# Patient Record
Sex: Female | Born: 1965 | Race: Black or African American | Hispanic: No | Marital: Married | Smoking: Never smoker
Health system: Southern US, Community
[De-identification: ages and names within clinical notes are randomized; demographics above are authoritative.]

## PROBLEM LIST (undated history)

## (undated) DIAGNOSIS — R112 Nausea with vomiting, unspecified: Secondary | ICD-10-CM

## (undated) DIAGNOSIS — I10 Essential (primary) hypertension: Secondary | ICD-10-CM

## (undated) DIAGNOSIS — F32A Depression, unspecified: Secondary | ICD-10-CM

## (undated) DIAGNOSIS — T7840XA Allergy, unspecified, initial encounter: Secondary | ICD-10-CM

## (undated) DIAGNOSIS — F419 Anxiety disorder, unspecified: Secondary | ICD-10-CM

## (undated) DIAGNOSIS — N393 Stress incontinence (female) (male): Secondary | ICD-10-CM

## (undated) DIAGNOSIS — O039 Complete or unspecified spontaneous abortion without complication: Secondary | ICD-10-CM

## (undated) HISTORY — DX: Allergy, unspecified, initial encounter: T78.40XA

## (undated) HISTORY — DX: Essential (primary) hypertension: I10

## (undated) HISTORY — PX: TUBAL LIGATION: SHX77

## (undated) HISTORY — PX: DILATION AND CURETTAGE OF UTERUS: SHX78

## (undated) HISTORY — PX: CHOLECYSTECTOMY: SHX55

## (undated) HISTORY — DX: Complete or unspecified spontaneous abortion without complication: O03.9

---

## 1997-09-08 ENCOUNTER — Ambulatory Visit (HOSPITAL_COMMUNITY): Admission: RE | Admit: 1997-09-08 | Discharge: 1997-09-08 | Payer: Self-pay | Admitting: Obstetrics and Gynecology

## 1997-09-10 ENCOUNTER — Ambulatory Visit (HOSPITAL_COMMUNITY): Admission: RE | Admit: 1997-09-10 | Discharge: 1997-09-10 | Payer: Self-pay | Admitting: Obstetrics and Gynecology

## 1998-06-15 ENCOUNTER — Other Ambulatory Visit: Admission: RE | Admit: 1998-06-15 | Discharge: 1998-06-15 | Payer: Self-pay | Admitting: Obstetrics and Gynecology

## 1998-12-28 ENCOUNTER — Other Ambulatory Visit: Admission: RE | Admit: 1998-12-28 | Discharge: 1998-12-28 | Payer: Self-pay | Admitting: Obstetrics and Gynecology

## 1999-01-06 ENCOUNTER — Ambulatory Visit (HOSPITAL_COMMUNITY): Admission: AD | Admit: 1999-01-06 | Discharge: 1999-01-06 | Payer: Self-pay | Admitting: *Deleted

## 1999-01-06 ENCOUNTER — Encounter (INDEPENDENT_AMBULATORY_CARE_PROVIDER_SITE_OTHER): Payer: Self-pay

## 2000-02-17 ENCOUNTER — Other Ambulatory Visit: Admission: RE | Admit: 2000-02-17 | Discharge: 2000-02-17 | Payer: Self-pay | Admitting: Obstetrics and Gynecology

## 2000-07-31 ENCOUNTER — Encounter: Admission: RE | Admit: 2000-07-31 | Discharge: 2000-08-01 | Payer: Self-pay | Admitting: Obstetrics and Gynecology

## 2000-08-16 ENCOUNTER — Ambulatory Visit (HOSPITAL_COMMUNITY): Admission: RE | Admit: 2000-08-16 | Discharge: 2000-08-16 | Payer: Self-pay | Admitting: Obstetrics and Gynecology

## 2000-08-16 ENCOUNTER — Encounter: Payer: Self-pay | Admitting: Obstetrics and Gynecology

## 2000-09-12 ENCOUNTER — Inpatient Hospital Stay (HOSPITAL_COMMUNITY): Admission: AD | Admit: 2000-09-12 | Discharge: 2000-09-15 | Payer: Self-pay | Admitting: Obstetrics and Gynecology

## 2001-12-03 ENCOUNTER — Other Ambulatory Visit: Admission: RE | Admit: 2001-12-03 | Discharge: 2001-12-03 | Payer: Self-pay | Admitting: Obstetrics and Gynecology

## 2002-09-23 ENCOUNTER — Other Ambulatory Visit: Admission: RE | Admit: 2002-09-23 | Discharge: 2002-09-23 | Payer: Self-pay | Admitting: Obstetrics and Gynecology

## 2002-09-24 ENCOUNTER — Ambulatory Visit (HOSPITAL_COMMUNITY): Admission: RE | Admit: 2002-09-24 | Discharge: 2002-09-24 | Payer: Self-pay | Admitting: Obstetrics and Gynecology

## 2002-09-24 ENCOUNTER — Encounter (INDEPENDENT_AMBULATORY_CARE_PROVIDER_SITE_OTHER): Payer: Self-pay

## 2003-06-18 ENCOUNTER — Ambulatory Visit (HOSPITAL_COMMUNITY): Admission: RE | Admit: 2003-06-18 | Discharge: 2003-06-18 | Payer: Self-pay | Admitting: Obstetrics and Gynecology

## 2003-11-14 ENCOUNTER — Inpatient Hospital Stay (HOSPITAL_COMMUNITY): Admission: AD | Admit: 2003-11-14 | Discharge: 2003-11-16 | Payer: Self-pay | Admitting: Obstetrics and Gynecology

## 2004-02-17 ENCOUNTER — Other Ambulatory Visit: Admission: RE | Admit: 2004-02-17 | Discharge: 2004-02-17 | Payer: Self-pay | Admitting: Obstetrics and Gynecology

## 2005-03-02 ENCOUNTER — Other Ambulatory Visit: Admission: RE | Admit: 2005-03-02 | Discharge: 2005-03-02 | Payer: Self-pay | Admitting: Obstetrics and Gynecology

## 2008-08-25 ENCOUNTER — Emergency Department (HOSPITAL_COMMUNITY): Admission: EM | Admit: 2008-08-25 | Discharge: 2008-08-25 | Payer: Self-pay | Admitting: Emergency Medicine

## 2008-10-22 ENCOUNTER — Ambulatory Visit (HOSPITAL_COMMUNITY): Admission: RE | Admit: 2008-10-22 | Discharge: 2008-10-22 | Payer: Self-pay | Admitting: Obstetrics and Gynecology

## 2008-10-22 ENCOUNTER — Encounter (INDEPENDENT_AMBULATORY_CARE_PROVIDER_SITE_OTHER): Payer: Self-pay | Admitting: General Surgery

## 2008-11-19 ENCOUNTER — Ambulatory Visit (HOSPITAL_COMMUNITY): Admission: RE | Admit: 2008-11-19 | Discharge: 2008-11-19 | Payer: Self-pay | Admitting: Obstetrics and Gynecology

## 2009-10-05 ENCOUNTER — Ambulatory Visit: Payer: Self-pay | Admitting: Diagnostic Radiology

## 2009-10-05 ENCOUNTER — Emergency Department (HOSPITAL_BASED_OUTPATIENT_CLINIC_OR_DEPARTMENT_OTHER): Admission: EM | Admit: 2009-10-05 | Discharge: 2009-10-05 | Payer: Self-pay | Admitting: Emergency Medicine

## 2010-02-24 ENCOUNTER — Inpatient Hospital Stay (INDEPENDENT_AMBULATORY_CARE_PROVIDER_SITE_OTHER)
Admission: RE | Admit: 2010-02-24 | Discharge: 2010-02-24 | Disposition: A | Discharge status: Home / Self Care | Payer: Self-pay | Source: Ambulatory Visit | Attending: Family Medicine | Admitting: Family Medicine

## 2010-02-24 ENCOUNTER — Emergency Department (HOSPITAL_COMMUNITY)
Admission: EM | Admit: 2010-02-24 | Discharge: 2010-02-25 | Disposition: A | Discharge status: Home / Self Care | Payer: 59 | Attending: Emergency Medicine | Admitting: Emergency Medicine

## 2010-02-24 DIAGNOSIS — R112 Nausea with vomiting, unspecified: Secondary | ICD-10-CM | POA: Insufficient documentation

## 2010-02-24 DIAGNOSIS — R509 Fever, unspecified: Secondary | ICD-10-CM | POA: Insufficient documentation

## 2010-02-24 DIAGNOSIS — E860 Dehydration: Secondary | ICD-10-CM

## 2010-02-24 DIAGNOSIS — E869 Volume depletion, unspecified: Secondary | ICD-10-CM | POA: Insufficient documentation

## 2010-02-24 DIAGNOSIS — R000 Tachycardia, unspecified: Secondary | ICD-10-CM | POA: Insufficient documentation

## 2010-02-24 LAB — POCT URINALYSIS DIPSTICK
Bilirubin Urine: NEGATIVE
Ketones, ur: 40 mg/dL — AB
Nitrite: NEGATIVE
Protein, ur: NEGATIVE mg/dL
Specific Gravity, Urine: 1.02 (ref 1.005–1.030)
Urine Glucose, Fasting: 500 mg/dL — AB
Urobilinogen, UA: 1 mg/dL (ref 0.0–1.0)
pH: 6.5 (ref 5.0–8.0)

## 2010-02-24 LAB — POCT I-STAT, CHEM 8
BUN: 13 mg/dL (ref 6–23)
BUN: 10 mg/dL (ref 6–23)
Calcium, Ion: 1.11 mmol/L — ABNORMAL LOW (ref 1.12–1.32)
Calcium, Ion: 1.09 mmol/L — ABNORMAL LOW (ref 1.12–1.32)
Chloride: 99 mEq/L (ref 96–112)
Chloride: 99 mEq/L (ref 96–112)
Creatinine, Ser: 1 mg/dL (ref 0.4–1.2)
Creatinine, Ser: 1 mg/dL (ref 0.4–1.2)
Glucose, Bld: 91 mg/dL (ref 70–99)
Glucose, Bld: 89 mg/dL (ref 70–99)
HCT: 47 % — ABNORMAL HIGH (ref 36.0–46.0)
Hemoglobin: 16 g/dL — ABNORMAL HIGH (ref 12.0–15.0)
Potassium: 3.5 mEq/L (ref 3.5–5.1)
Potassium: 3.6 mEq/L (ref 3.5–5.1)
Sodium: 136 mEq/L (ref 135–145)
TCO2: 25 mmol/L (ref 0–100)

## 2010-02-24 LAB — POCT PREGNANCY, URINE: Preg Test, Ur: NEGATIVE

## 2010-03-17 LAB — URINALYSIS, ROUTINE W REFLEX MICROSCOPIC
Glucose, UA: NEGATIVE mg/dL
Hgb urine dipstick: NEGATIVE
Specific Gravity, Urine: 1.025 (ref 1.005–1.030)
Urobilinogen, UA: 1 mg/dL (ref 0.0–1.0)
pH: 7.5 (ref 5.0–8.0)

## 2010-03-17 LAB — COMPREHENSIVE METABOLIC PANEL
Albumin: 4.3 g/dL (ref 3.5–5.2)
Alkaline Phosphatase: 91 U/L (ref 39–117)
BUN: 10 mg/dL (ref 6–23)
GFR calc Af Amer: >60 mL/min (ref >60)
Potassium: 4.2 mEq/L (ref 3.5–5.1)
Total Protein: 7.7 g/dL (ref 6.0–8.3)

## 2010-03-17 LAB — CBC
MCV: 89.4 fL (ref 78.0–100.0)
Platelets: 302 10*3/uL (ref 150–400)
RDW: 12.3 % (ref 11.5–15.5)
WBC: 6.4 10*3/uL (ref 4.0–10.5)

## 2010-03-17 LAB — URINE MICROSCOPIC-ADD ON

## 2010-03-17 LAB — DIFFERENTIAL
Basophils Relative: 0 % (ref 0–1)
Lymphocytes Relative: 9 % — ABNORMAL LOW (ref 12–46)
Monocytes Absolute: 0.1 10*3/uL (ref 0.1–1.0)
Monocytes Relative: 1 % — ABNORMAL LOW (ref 3–12)
Neutro Abs: 5.8 10*3/uL (ref 1.7–7.7)

## 2010-04-07 LAB — PREGNANCY, URINE: Preg Test, Ur: NEGATIVE

## 2010-04-07 LAB — COMPREHENSIVE METABOLIC PANEL
CO2: 25 mEq/L (ref 19–32)
Calcium: 9 mg/dL (ref 8.4–10.5)
Creatinine, Ser: 0.82 mg/dL (ref 0.4–1.2)
GFR calc non Af Amer: >60 mL/min (ref >60)
Glucose, Bld: 90 mg/dL (ref 70–99)

## 2010-04-07 LAB — CBC
Hemoglobin: 14 g/dL (ref 12.0–15.0)
MCHC: 33.4 g/dL (ref 30.0–36.0)
MCV: 89.4 fL (ref 78.0–100.0)
RBC: 4.67 MIL/uL (ref 3.87–5.11)

## 2010-04-07 LAB — DIFFERENTIAL
Eosinophils Absolute: 0.1 10*3/uL (ref 0.0–0.7)
Lymphocytes Relative: 37 % (ref 12–46)
Lymphs Abs: 2.6 10*3/uL (ref 0.7–4.0)
Neutrophils Relative %: 54 % (ref 43–77)

## 2010-04-09 LAB — POCT URINALYSIS DIP (DEVICE)
Glucose, UA: NEGATIVE mg/dL
Specific Gravity, Urine: 1.015 (ref 1.005–1.030)
Urobilinogen, UA: 0.2 mg/dL (ref 0.0–1.0)

## 2010-04-09 LAB — POCT PREGNANCY, URINE: Preg Test, Ur: NEGATIVE

## 2010-04-09 LAB — COMPREHENSIVE METABOLIC PANEL
ALT: 9 U/L (ref 0–35)
AST: 26 U/L (ref 0–37)
Albumin: 3.8 g/dL (ref 3.5–5.2)
Alkaline Phosphatase: 83 U/L (ref 39–117)
BUN: 4 mg/dL — ABNORMAL LOW (ref 6–23)
Chloride: 103 mEq/L (ref 96–112)
Potassium: 3.7 mEq/L (ref 3.5–5.1)
Sodium: 134 mEq/L — ABNORMAL LOW (ref 135–145)
Total Bilirubin: 0.8 mg/dL (ref 0.3–1.2)

## 2010-04-09 LAB — CBC
HCT: 36.2 % (ref 36.0–46.0)
Platelets: 309 10*3/uL (ref 150–400)
WBC: 7 10*3/uL (ref 4.0–10.5)

## 2010-04-09 LAB — DIFFERENTIAL
Basophils Absolute: 0 10*3/uL (ref 0.0–0.1)
Basophils Relative: 1 % (ref 0–1)
Eosinophils Absolute: 0 10*3/uL (ref 0.0–0.7)
Eosinophils Relative: 0 % (ref 0–5)
Monocytes Absolute: 0.1 10*3/uL (ref 0.1–1.0)
Monocytes Relative: 2 % — ABNORMAL LOW (ref 3–12)
Neutro Abs: 6.2 10*3/uL (ref 1.7–7.7)

## 2010-05-20 NOTE — Op Note (Signed)
NAME:  Karen Cameron, Karen Cameron                         ACCOUNT NO.:  [id number]   MEDICAL RECORD NO.:  [id number]                   PATIENT TYPE:  AMB   LOCATION:  SDC                                  FACILITY:  [facility]   PHYSICIAN:  Hal Morales, M.D.             DATE OF BIRTH:  Oct 08, 1965   DATE OF PROCEDURE:  09/24/2002  DATE OF DISCHARGE:                                 OPERATIVE REPORT   PREOPERATIVE DIAGNOSIS:  Missed abortion.   POSTOPERATIVE DIAGNOSIS:  Missed abortion.   OPERATION:  Suction dilatation and evacuation.   ANESTHESIA:  Monitored anesthesia care and local.   ESTIMATED BLOOD LOSS:  Less than 25 mL.   COMPLICATIONS:  None.   FINDINGS:  The uterus was enlarged to approximately eight weeks' size with a  moderate amount of products of conception obtained at D&E.   DESCRIPTION OF PROCEDURE:  A discussion had been held with the patient after  the finding of an eight week IUP with no fetal cardiac activity on  ultrasound.  She was given the option of observation, dilatation and  evacuation, or management with Cytotec.  She opted for suction D&E.  The  risks of anesthesia, bleeding, infection, and damage to adjacent organs were  all understood.  We proceeded to the operating room after appropriate  identification and placed the patient on the operating table.  After  placement of equipment for monitored anesthesia care, she was placed in the  lithotomy position.  The perineum and vagina were prepped with multiple  layers of Betadine and the bladder emptied with a red Robinson catheter.  The perineum was draped as a sterile field.  A Graves speculum was placed in  the vagina and a paracervical block achieved with a total of 10 mL of 2%  Xylocaine in the 5 and 7 o'clock positions.  A single-tooth tenaculum was  placed and the cervix dilated to accommodate a #8 suction curette.  This was  used to suction-evacuate all quadrants of the uterus.  A sharp curette was  used to  ensure that products of conception were removed.  Hemostasis was  noted to be adequate.  The instruments were then removed and the patient  taken from the operating room to the recovery room in satisfactory  condition, having tolerated the procedure well with sponge and instrument  counts correct.  Blood type is O positive.  Discharge medications:  Methergine 0.2 mg p.o. q.6h. for eight doses, doxycycline 100 mg p.o.  b.i.d., for five days, prenatal vitamins, ibuprofen 600 mg over-the-counter  q.6h. p.r.n. pain.  Follow-up:  The patient is to call for a two-week  appointment.  Discharge instructions:  Printed instructions from the [facility] [place].  Hal Morales, M.D.    VPH/MEDQ  D:  09/24/2002  T:  09/24/2002  Job:  [id number]

## 2010-05-20 NOTE — H&P (Signed)
[facility]  Patient:    FRANCIES, INCH Visit Number: [id number] MRN: [id number]          Service Type: OBS Location: 910B [id number] [id number] Attending Physician:  Leonard Schwartz Dictated by:   Mack Guise, C.N.M. Admit Date:  09/12/2000                           History and Physical  HISTORY OF PRESENT ILLNESS:   Ms. Bilton is a 45 year old, gravida 2, para 0-0-1-0, at 39-1/2 weeks. She is admitted with gestational diabetes following questionable deceleration on electronic fetal monitoring in the office, for induction of labor. She reports positive fetal movement, no bleeding, no rupture of membranes. She denies any headache, visual changes, or epigastric pain. Her pregnancy has been followed by the MD service at [facility] and is remarkable for (1) family history of Downs syndrome, (2) group B strep negative. This patient was initially evaluated at the office of [facility] on February 17, 2000 at approximately nine weeks gestation. Her pregnancy has been complicated by gestational diabetes. Her blood sugars have been well maintained with diet. She has been normotensive with no proteinuria, being size less than dates throughout, but ultrasound followup for growth has been appropriate.  PRENATAL LABORATORY WORK:     On February 17, 2000 prenatal lab work found hemoglobin and hematocrit 11.6 and 36.5, platelets 327,000. Blood type and Rh O positive, antibody screen negative, VDRL nonreactive, rubella immune, hepatitis B surface antigen negative, HIV nonreactive. Pap smear was within normal limits. GC and Chlamydia were negative. On March 21, 2000 AFP/free beta hCG within normal range. On July 14, 2000 one-hour glucose challenge was 142. Three-hour GTT values ___, 190, 185, and 170 for which patient was diagnosed with gestational diabetes. Hemoglobin at 28 weeks 10.6, and at 36 weeks culture of the vaginal tract is negative for group B strep.  OBSTETRICAL  HISTORY:          January 2001 first trimester missed AB with D&E and the present pregnancy.  MEDICAL HISTORY:              Unremarkable.  FAMILY HISTORY:               Maternal aunt bypass surgery and maternal aunt hypertension. Patients father is a smoker.  GENETIC HISTORY:              Patients first cousins baby born with Downs syndrome.  ALLERGIES:                    No known drug allergies.  HABITS:                       Patient denies the use of tobacco, alcohol, or illicit drugs.  SOCIAL HISTORY:               Ms. Romanski is a 45 year old African-American married female. Her husband, Kamica Florance, is involved and supportive. They are of the [facility] faith.  REVIEW OF SYSTEMS:            As described above.  PHYSICAL EXAMINATION:  VITAL SIGNS:                  Vital signs stable, afebrile.  HEENT:                        Unremarkable.  NECK:  Thyroid not enlarged.  HEART:                        Regular rate and rhythm.  LUNGS:                        Clear.  ABDOMEN:                      Gravid in its contour, uterine fundus is noted to extend 36 cm above the level of pubic symphysis. Leopolds maneuvers finds the infant to be in longitudinal lie, cephalic presentation, and the estimated fetal weight is 7 pounds.  ELECTRONIC FETAL MONITOR:     Fetal heart rate noted questionable decelerations earlier in the office. The fetal heart rate is reactive at present with mild uterine contractions.  PELVIC:                       Digital exam of the cervix finds it to be 3 cm dilated, 75% effaced, with a cephalic presenting part at a -1 station.  EXTREMITIES:                  DTRs are 1+ with no clonus.  ASSESSMENT:                   1. Intrauterine pregnancy at 39-1/2 weeks.                               2. Questionable deceleration on electronic fetal                                  monitoring.  PLAN:                         1. Admit per Dr. Marline Backbone.                               2. Induction of labor in a.m.                               3. Check blood sugars q.2h. Dictated by:   Mack Guise, C.N.M. Attending Physician:  Leonard Schwartz DD:  09/12/00 TD:  09/12/00 Job: [id number]

## 2010-05-20 NOTE — H&P (Signed)
[facility]  Patient:    Karen Cameron                       MRN: [id number] Adm. Date:  [id number] Attending:  Pleas Koch                         History and Physical  HISTORY:                      Patient is a 45 year old gravida 1, para 0 who presented for a routine new OB visit at approximately 10-weeks estimated gestational age.  Fetal heart tones could not be auscultated.  Patient returned on today for an ultrasound; missed Ab was confirmed.  Patient was therefore consented for dilatation and evacuation.  ALLERGIES:                    Patient has no known drug allergies.  MEDICATIONS:                  She takes no medications chronically.  PAST MEDICAL HISTORY:         She denies medical illnesses.  PAST SURGICAL HISTORY:        Noncontributory.  FAMILY HISTORY:               Significant for aunt with diabetes and hypertension.  SOCIAL HISTORY:               Patient is in a stable marital relationship.  She  denies domestic violence.  She denies tobacco, alcohol or recreational drug use.  REVIEW OF SYSTEMS:            Noncontributory.  PHYSICAL EXAMINATION:  VITAL SIGNS:                  Stable.  Patient is afebrile.  HEENT:                        Within normal limits.  LUNGS:                        Clear to auscultation bilaterally.  HEART:                        Regular rate and rhythm.  ABDOMEN:                      Soft, nontender.  No hepatosplenomegaly.  EXTREMITIES:                  No clubbing, cyanosis, or edema.  PELVIC:                       Normal external female genitalia.  There is a small amount of dark brown blood in the vaginal vault.  Cervix is closed.  Uterus is anteverted, approximately 10- to 12-weeks-size.  Adnexa revealed no masses.  RECTAL:                       Deferred.  LABORATORY DATA:              Blood type is O-positive.  IMPRESSION:                   Missed abortion.  PLAN:  Risks, benefits and alternatives to dilatation and evacuation were reviewed.  Patient is understanding and willing to accept these  risks which include, but are not limited by, a risk of bleeding, risk of infection and risk of damage to the uterus and subsequently internal organs such as the bowel and bladder; patient has thus been consented. DD:  01/06/99 TD:  01/07/99 Job: [id number]

## 2010-05-20 NOTE — H&P (Signed)
NAMEBEVERLY, Karen Cameron               ACCOUNT NO.:  [id number]   MEDICAL RECORD NO.:  [id number]          PATIENT TYPE:  INP   LOCATION:  9164                          FACILITY:  [facility]   PHYSICIAN:  Janine Limbo, M.D.DATE OF BIRTH:  09-10-1965   DATE OF ADMISSION:  11/14/2003  DATE OF DISCHARGE:                                HISTORY & PHYSICAL   Karen Cameron is a 45 year old gravida 4, para 1, 0-2-1 at 59 5/7 weeks who  presents with uterine contractions every 10 minutes for the last 12 hours.  She denies any bleeding or leaking and reports positive fetal movement.  She  denies headache, visual symptoms, or epigastric pain.  Her cervix in the  office yesterday was 2 cm, 50%, vertex, at a -1 station.  Her pregnancy has  been remarkable for:  1) History of gestational diabetes last pregnancy.  2)  Advanced maternal age with amnio decline, but normal quadruple screen.  3)  History of fibroids.  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  5) Two SABs.  6) Family history of Down syndrome.   PRENATAL LABORATORY:  Blood type O positive, Rh antibody negative, VDRL  nonreactive.  Rubella titer positive.  Hepatitis B surface antigen negative.  Sickle cell test negative.  CG and Chlamydia cultures were negative in the  first trimester.  Pap was normal.  Glucose challenge at 18 weeks was  elevated at 143.  Three hour GGT was normal.  Hemoglobin upon entering the  practice was 11.6; it was within normal limits at 28 weeks.  Quadruple  screen was normal.  GC Chlamydia cultures and beta Strep were all negative  at 36 weeks.   EDC of November 16, 2003 was established by last menstrual period and was in  agreement with ultrasound at approximately 18 weeks.  The patient did have a  positive E coli urine culture at her fist visit.  She was treated with  Macrobid.   HISTORY OF PRESENT PREGNANCY:  The patient entered care at approximately 12  weeks.  She has a UTI at that visit  and was treated with Macrobid. She was  found to have E coli and this was sensitive to the Macrobid.  She declined  amniocentesis.  She had a quadruple screen that was normal.  She had an  ultrasound at 18 weeks showing normal growth and development.  Her one hour  Glucola at 19 weeks was elevated at 143.  Her three hour Glucola was normal.  She had another ultrasound at 35 weeks, secondary to size less than date.  She was measuring at the 82nd percentile for 36 weeks with normal fluid.  The rest of her pregnancy was essentially uncomplicated.   OBSTETRIC HISTORY:  In 2001, she had a first trimester missed AB with a D&E.  In 2002, she had a spontaneous vaginal birth of a female infant, weight 6  pounds 2 ounces at 38 6/7 weeks.  She was in labor 10 hours.  She had  epidural anesthesia.  She did have gestational diabetes, diet controlled  with that pregnancy.  In September of 2004, she had a spontaneous missed AB  and did have a D&E.   PAST MEDICAL HISTORY:  She was on oral contraceptives in the past.  She had  a vulvar labial mole removed in the past that was benign.  She had fibroids  noted in a previous pregnancy, then they were not noted in her subsequent  pregnancies.  She reports the usual childhood illnesses.   PAST SURGICAL HISTORY:  Includes the removal of the vulvar mole in December  of 2004, which was benign and two D&Es.   Her only other hospitalization was for childbirth.   She has no known medication allergies.   FAMILY HISTORY:  Her paternal aunt had an MI and hypertension.  Her mother  has adult onset diabetes.   GENETIC HISTORY:  Remarkable for the patient's first cousin having a child  with Down syndrome.   SOCIAL HISTORY:  The patient is married to the father of the baby.  He is  involved and supportive.  His name is Elliot Cousin.  The patient is college  educated.  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  Her husband also is  college educated.  He is employed  with the [employer].  She has been followed  by the physician service of [facility].  She denies any alcohol,  drug, or tobacco use during this pregnancy.   PHYSICAL EXAMINATION:  Blood pressures were initially 120/90, 129/94, and  131/97.  Other vital signs were stable. After observation and reevaluation,  her blood pressure was in the 120s/70s.  HEENT:  Within normal limits  LUNGS:  Breath sounds are clear.  HEART:  Regular rate and rhythm without murmur.  BREASTS:  Soft and nontender.  ABDOMEN:  Fundal height is approximately 36 cm.  Estimated fetal weight is 7  pounds.  Uterine contractions every five minutes, moderate quality.  Cervical exam initially was 2-3, 70%, vertex, at a -1 station with a  probable transverse position in vertex.  After ambulation and reevaluation,  the cervix was 3-4 cm, 70%, vertex, at a -1.  Fetal heart rate is reactive  with no decelerations.  Uterine contractions now are approximately every  five minutes, moderate quality. Urine examination was negative for protein.  EXTREMITIES:  Deep tendon reflexes are 2+ without clonus.  There is trace  edema noted.   IMPRESSION:  1.  Intrauterine pregnancy at 39 5/7 weeks.  2.  Early labor.  3.  Negative group B Strep.   PLAN:  1.  Admit to birthing suite for consult with Dr. Marline Backbone, attending      physician.  2.  Routine physician orders.  3.  IV pain medication or epidural p.r.n.     Vick   VLL/MEDQ  D:  11/14/2003  T:  11/14/2003  Job:  [id number]

## 2010-06-16 ENCOUNTER — Other Ambulatory Visit (HOSPITAL_COMMUNITY): Payer: Self-pay | Admitting: Gastroenterology

## 2010-06-16 DIAGNOSIS — R112 Nausea with vomiting, unspecified: Secondary | ICD-10-CM

## 2010-07-13 ENCOUNTER — Encounter (HOSPITAL_COMMUNITY): Payer: Self-pay

## 2010-07-13 ENCOUNTER — Encounter (HOSPITAL_COMMUNITY)
Admission: RE | Admit: 2010-07-13 | Discharge: 2010-07-13 | Disposition: A | Discharge status: Home / Self Care | Payer: 59 | Source: Ambulatory Visit | Attending: Gastroenterology | Admitting: Gastroenterology

## 2010-07-13 DIAGNOSIS — R112 Nausea with vomiting, unspecified: Secondary | ICD-10-CM | POA: Insufficient documentation

## 2010-07-13 HISTORY — DX: Nausea with vomiting, unspecified: R11.2

## 2010-07-13 MED ORDER — TECHNETIUM TC 99M SULFUR COLLOID
2.1000 | Freq: Once | INTRAVENOUS | Status: AC | PRN
  Administered 2010-07-13: 2.1 via INTRAVENOUS

## 2010-07-27 ENCOUNTER — Other Ambulatory Visit: Payer: Self-pay | Admitting: Gastroenterology

## 2011-12-13 ENCOUNTER — Encounter: Payer: Self-pay | Admitting: Obstetrics and Gynecology

## 2011-12-13 ENCOUNTER — Ambulatory Visit (INDEPENDENT_AMBULATORY_CARE_PROVIDER_SITE_OTHER): Payer: 59 | Admitting: Obstetrics and Gynecology

## 2011-12-13 VITALS — BP 120/78 | HR 100 | Ht 62.0 in | Wt 134.0 lb

## 2011-12-13 DIAGNOSIS — Z124 Encounter for screening for malignant neoplasm of cervix: Secondary | ICD-10-CM

## 2011-12-13 DIAGNOSIS — Z113 Encounter for screening for infections with a predominantly sexual mode of transmission: Secondary | ICD-10-CM

## 2011-12-13 DIAGNOSIS — Z01419 Encounter for gynecological examination (general) (routine) without abnormal findings: Secondary | ICD-10-CM

## 2011-12-13 DIAGNOSIS — Z1239 Encounter for other screening for malignant neoplasm of breast: Secondary | ICD-10-CM

## 2011-12-13 NOTE — Progress Notes (Signed)
Subjective:    Karen Cameron is a 46 y.o. female, G4P0, who presents for an annual exam. The patient reports no complaints. Has not had a mammogram in years.  Menstrual cycle:   LMP: Patient's last menstrual period was 11/17/2011.             Review of Systems Pertinent items are noted in HPI. Denies pelvic pain, urinary tract symptoms, vaginitis symptoms, irregular bleeding, menopausal symptoms, change in bowel habits or rectal bleeding   Objective:    BP 120/78  Pulse 100  Ht 5\' 2"  (1.575 m)  Wt 134 lb (60.782 kg)  BMI 24.51 kg/m2  LMP 11/17/2011    Wt Readings from Last 1 Encounters:  12/13/11 134 lb (60.782 kg)   Body mass index is 24.51 kg/(m^2). General Appearance: Alert, no acute distress HEENT: Grossly normal Neck / Thyroid: Supple, no thyromegaly or cervical adenopathy Lungs: Clear to auscultation bilaterally Back: No CVA tenderness Breast Exam: No masses or nodes.No dimpling, nipple retraction or discharge. Cardiovascular: Regular rate and rhythm.  Gastrointestinal: Soft, non-tender, no masses or organomegaly Pelvic Exam: EGBUS-wnl, vagina-normal rugae, cervix- without lesions or tenderness, uterus appears normal size shape and consistency, adnexae-no masses or tenderness Rectovaginal: no masses and normal sphincter tone Lymphatic Exam: Non-palpable nodes in neck, clavicular,  axillary, or inguinal regions  Skin: no rashes or abnormalities Extremities: no clubbing cyanosis or edema  Neurologic: grossly normal Psychiatric: Alert and oriented    Assessment:   Routine GYN Exam   Plan:  STD testing  PAP sent  RTO 1 year or prn  Ludmila Ebarb,ELMIRAPA-C

## 2011-12-13 NOTE — Progress Notes (Signed)
Regular Periods: yes Mammogram: yes  Monthly Breast Ex.: yes Exercise: NO  Tetanus < 10 years: yes Seatbelts: yes  NI. Bladder Functn.: yes Abuse at home: no  Daily BM's: yes Stressful Work: no  Healthy Diet: yes Sigmoid-Colonoscopy: NO  Calcium: no Medical problems this year: NONE   LAST PAP:6/12  Contraception: BTL  Mammogram:  LONG TIME  PCP: DR. Eldred Manges  PMH: NOCHANGE  FMH: NO CHANGE  Last Bone Scan: NO  PT IS MARRIED

## 2011-12-14 LAB — HSV 2 ANTIBODY, IGG: HSV 2 Glycoprotein G Ab, IgG: 12.62 IV — ABNORMAL HIGH

## 2011-12-14 LAB — PAP IG, CT-NG, RFX HPV ASCU: GC Probe Amp: NEGATIVE

## 2011-12-14 LAB — HEPATITIS B SURFACE ANTIGEN: Hepatitis B Surface Ag: NEGATIVE

## 2011-12-14 LAB — HEPATITIS C ANTIBODY: HCV Ab: NEGATIVE

## 2011-12-15 ENCOUNTER — Ambulatory Visit (HOSPITAL_COMMUNITY)
Admission: RE | Admit: 2011-12-15 | Discharge: 2011-12-15 | Disposition: A | Discharge status: Home / Self Care | Payer: 59 | Source: Ambulatory Visit | Attending: Obstetrics and Gynecology | Admitting: Obstetrics and Gynecology

## 2011-12-15 ENCOUNTER — Telehealth: Payer: Self-pay

## 2011-12-15 DIAGNOSIS — Z1239 Encounter for other screening for malignant neoplasm of breast: Secondary | ICD-10-CM

## 2011-12-15 DIAGNOSIS — Z1231 Encounter for screening mammogram for malignant neoplasm of breast: Secondary | ICD-10-CM | POA: Insufficient documentation

## 2011-12-15 NOTE — Telephone Encounter (Signed)
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  If not refer her to the [email or web address] and      [email or web address] web sites or she can come in for a consultation.  Thanks,  EP

## 2011-12-15 NOTE — Telephone Encounter (Signed)
TC TO PT REGARDING TEST RESULTS. 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 INFORMED PT ABOUT WEBSITES THAT SHE CAN GO TO FOR MORE INFO. PT VOICED UNDERSTANDING.

## 2011-12-29 ENCOUNTER — Other Ambulatory Visit: Payer: Self-pay | Admitting: Obstetrics and Gynecology

## 2011-12-29 MED ORDER — TINIDAZOLE 500 MG PO TABS: ORAL_TABLET | ORAL | Status: DC

## 2013-10-08 ENCOUNTER — Other Ambulatory Visit (HOSPITAL_COMMUNITY): Payer: Self-pay | Admitting: Obstetrics and Gynecology

## 2013-10-08 DIAGNOSIS — Z1231 Encounter for screening mammogram for malignant neoplasm of breast: Secondary | ICD-10-CM

## 2013-10-13 ENCOUNTER — Ambulatory Visit (HOSPITAL_COMMUNITY)
Admission: RE | Admit: 2013-10-13 | Discharge: 2013-10-13 | Disposition: A | Discharge status: Home / Self Care | Payer: 59 | Source: Ambulatory Visit | Attending: Obstetrics and Gynecology | Admitting: Obstetrics and Gynecology

## 2013-10-13 DIAGNOSIS — Z1231 Encounter for screening mammogram for malignant neoplasm of breast: Secondary | ICD-10-CM | POA: Insufficient documentation

## 2013-11-03 ENCOUNTER — Encounter: Payer: Self-pay | Admitting: Obstetrics and Gynecology

## 2013-12-12 ENCOUNTER — Encounter: Payer: Self-pay | Admitting: Podiatrist

## 2013-12-12 ENCOUNTER — Ambulatory Visit (INDEPENDENT_AMBULATORY_CARE_PROVIDER_SITE_OTHER): Payer: 59 | Admitting: Podiatrist

## 2013-12-12 VITALS — BP 142/85 | HR 86 | Resp 16

## 2013-12-12 DIAGNOSIS — B351 Tinea unguium: Secondary | ICD-10-CM

## 2013-12-12 MED ORDER — EFINACONAZOLE 10 % EX SOLN: 1.0000 [drp] | Freq: Every day | CUTANEOUS | Status: DC

## 2013-12-12 NOTE — Progress Notes (Signed)
   Subjective:    Patient ID: Karen Cameron, female    DOB: 1965-05-19, 48 y.o.   MRN: [id number]  HPI Comments: "I have bad looking nails"  Patient c/o thick, dark nails 1st and 5th bilateral for several years. She has tried multiple OTC treatments-no help.      Review of Systems  All other systems reviewed and are negative.      Objective:   Physical Exam Patient is awake, alert, and oriented x 3.  In no acute distress.  Vascular status is intact with palpable pedal pulses at 2/4 DP and PT bilateral and capillary refill time within normal limits. Neurological sensation is also intact bilaterally via Semmes Weinstein monofilament at 5/5 sites. Light touch, vibratory sensation, Achilles tendon reflex is intact. Dermatological exam reveals skin color, turger and texture as normal. No open lesions present.  Musculature intact with dorsiflexion, plantarflexion, inversion, eversion.  Digital nails 1 and 5 bilateral are thickened, discolored, dystrophic, symptomatic, and due to appear clinically mycotic. Over-the-counter topicals have not been beneficial for the treatment of the toenails.     Assessment & Plan:  Onychomycosis 1, 5 bilateral Plan: A sample of the toenail was taken and sent for pathology. Recommended starting on topical antifungal until the results of pathology back. We'll then consider our options at that time.

## 2013-12-12 NOTE — Patient Instructions (Signed)

## 2014-01-07 ENCOUNTER — Telehealth: Payer: Self-pay | Admitting: *Deleted

## 2014-01-07 NOTE — Telephone Encounter (Signed)
I called and left her a message to call me back for culture results.  I need to inform her that the culture results came back positive for fungus per Dr. Irving ShowsEgerton.  Lamisil is best for treating the fungus if the topical is not clearing the nails.  Suggest you try topical for 2-3 months and if no better will prescribe Lamisil.

## 2014-01-08 NOTE — Telephone Encounter (Signed)
"  I'm returning your call about my culture results."  Dr. Irving ShowsEgerton said it came back positive for fungus.  She wants you to use the topical for 2-3 months.  If you don't notice any improvement, she said she can give you a prescription for Lamisil.  "So far I haven't noticed a difference from using the topical."  Okay, just let us know after 2 months then.  "I will, thanks!"

## 2014-01-09 ENCOUNTER — Encounter: Payer: Self-pay | Admitting: Podiatrist

## 2015-01-10 IMAGING — MG MM DIGITAL SCREENING BILAT
4 series · 4 of 4 positions shown · non-contrast
Comparison: Previous exam(s).

CLINICAL DATA: Screening.

EXAM:
DIGITAL SCREENING BILATERAL MAMMOGRAM WITH CAD

[L CC]
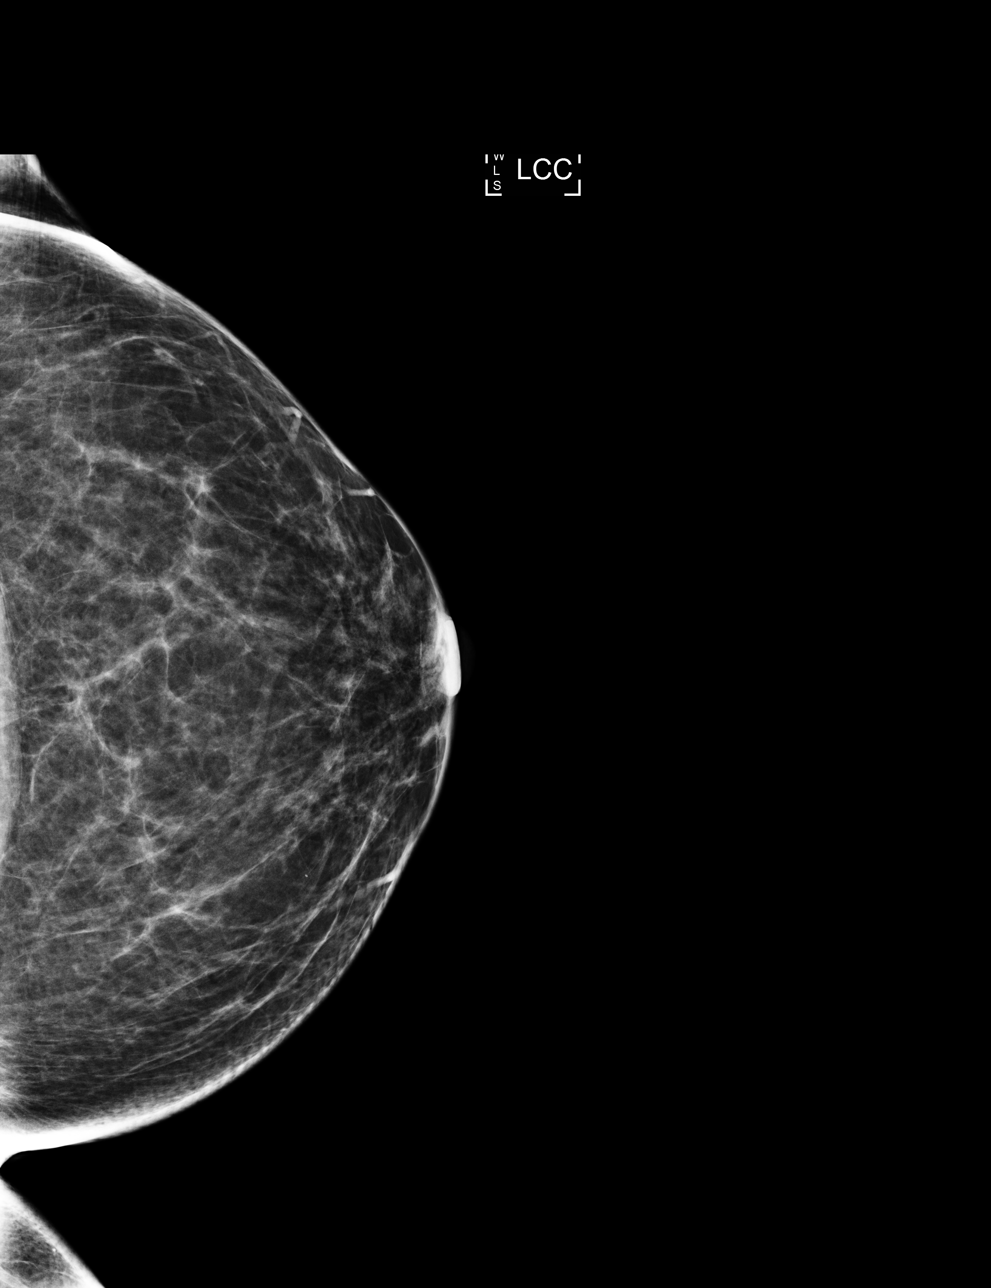

[L MLO]
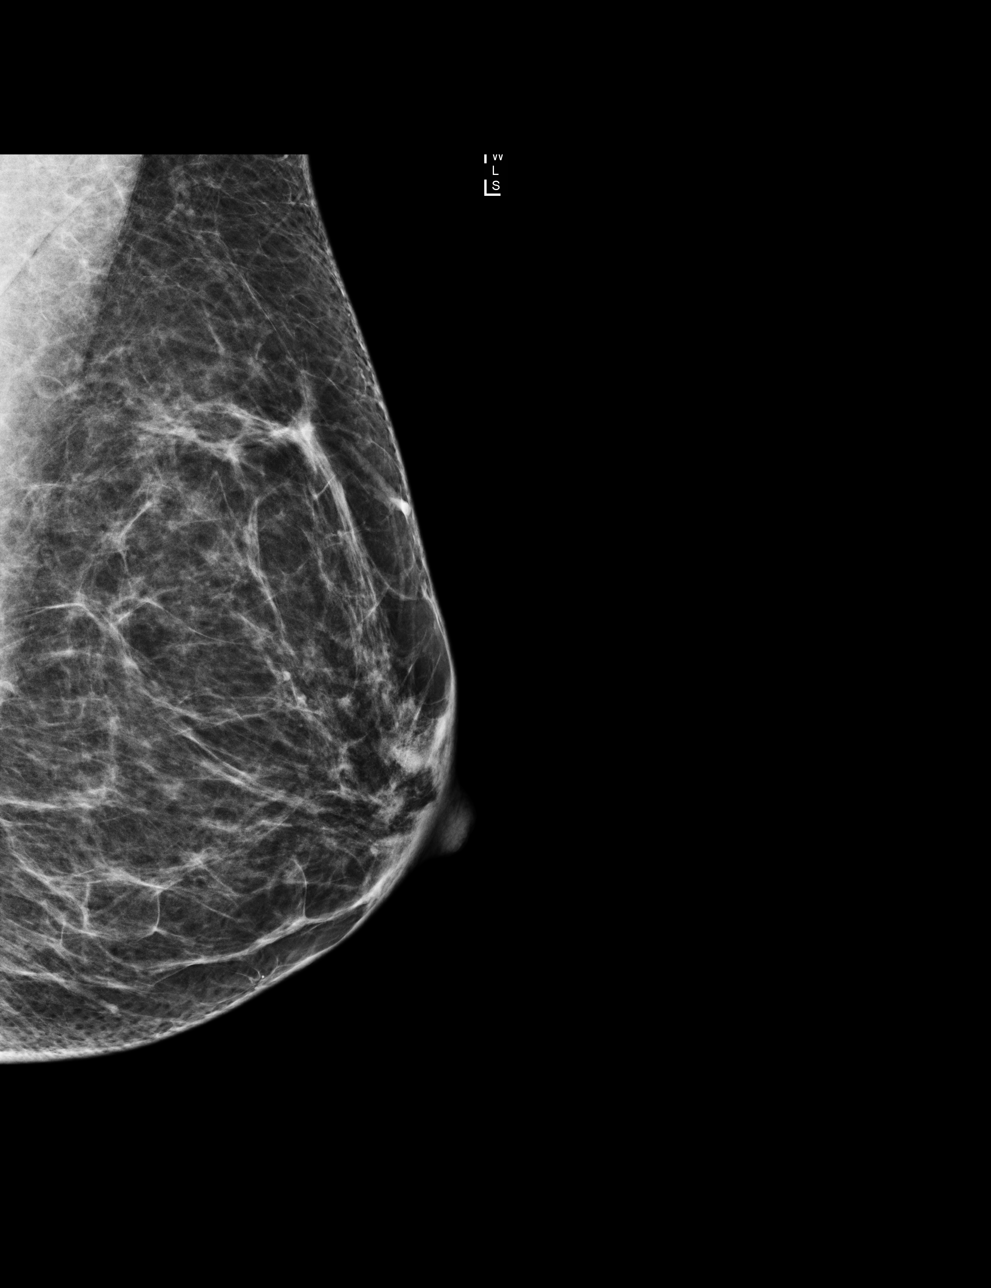

[R MLO]
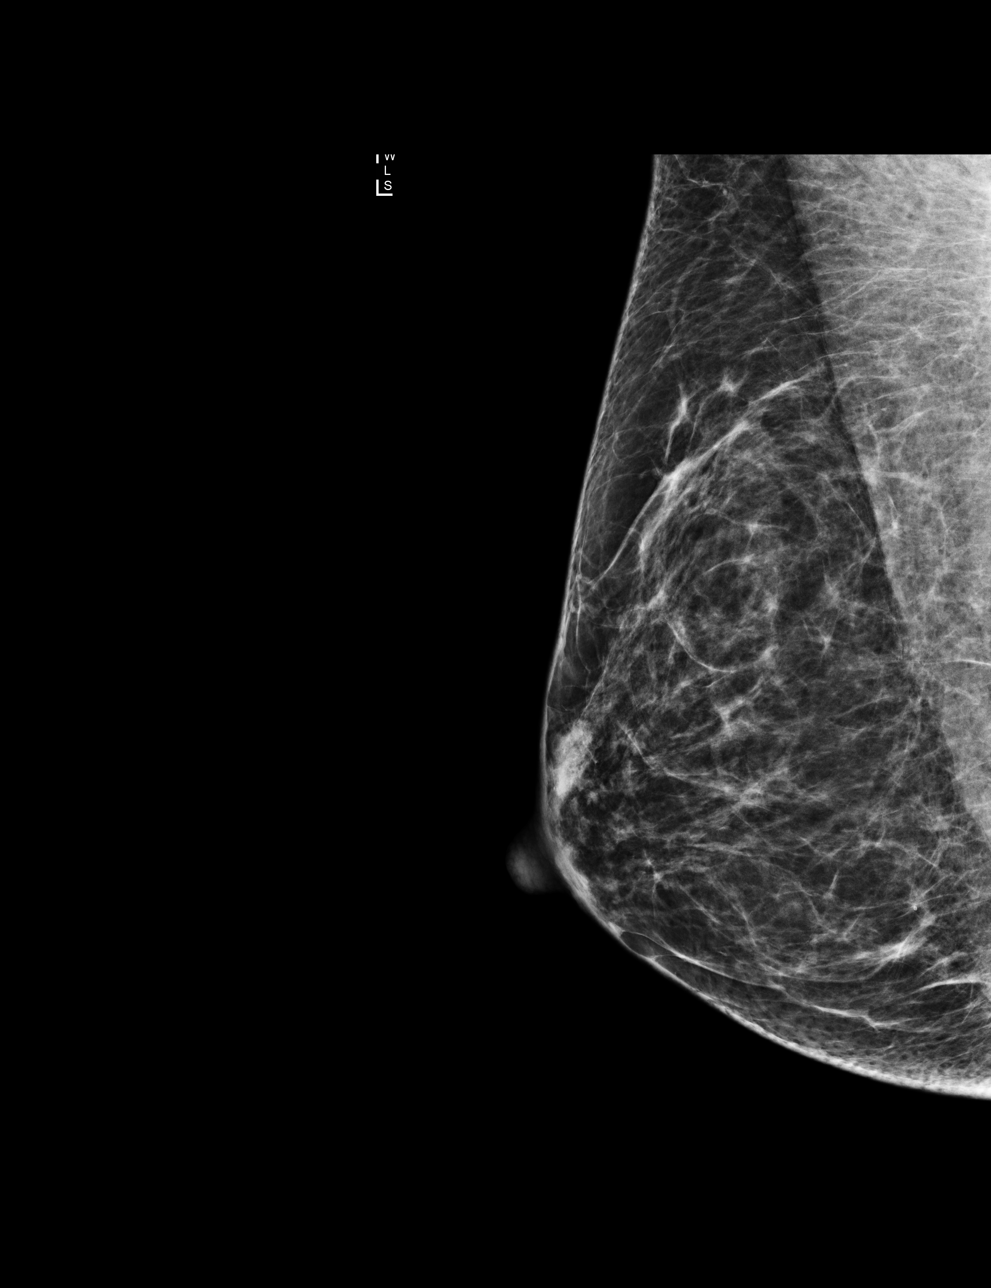

[R CC]
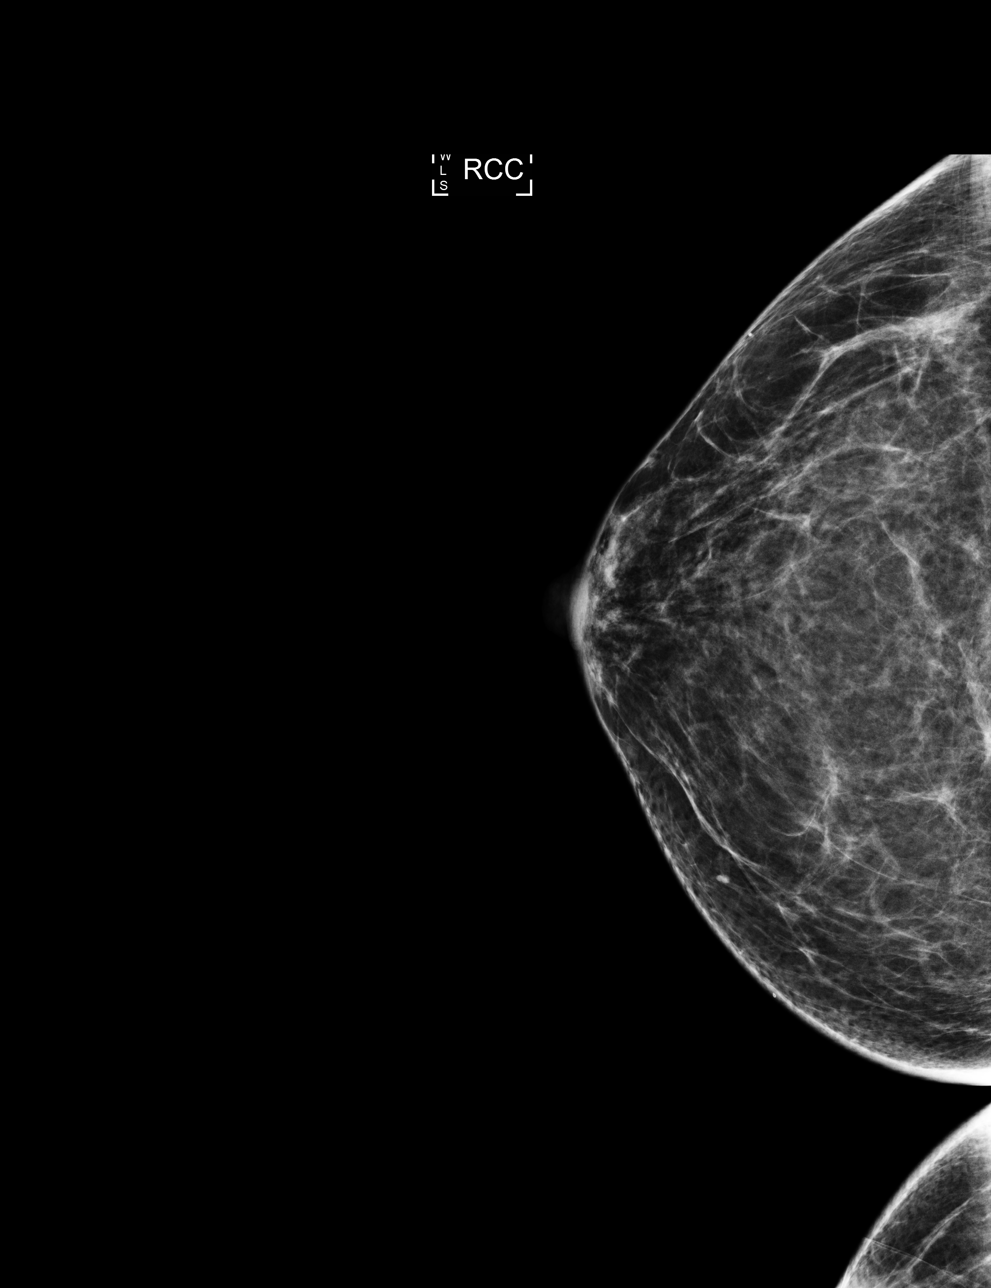

[4 of 4 positions shown; findings below may reference images not displayed]

ACR Breast Density Category b: There are scattered areas of
fibroglandular density.
FINDINGS: There are no findings suspicious for malignancy. Images were
processed with CAD.
IMPRESSION: No mammographic evidence of malignancy. A result letter of this
screening mammogram will be mailed directly to the patient.

RECOMMENDATION:
Screening mammogram in one year. (Code:AS-G-LCT)

BI-RADS CATEGORY  1: Negative.

## 2015-06-16 DIAGNOSIS — R590 Localized enlarged lymph nodes: Secondary | ICD-10-CM | POA: Diagnosis not present

## 2015-06-16 DIAGNOSIS — J310 Chronic rhinitis: Secondary | ICD-10-CM | POA: Insufficient documentation

## 2015-08-05 DIAGNOSIS — K219 Gastro-esophageal reflux disease without esophagitis: Secondary | ICD-10-CM | POA: Diagnosis not present

## 2015-08-05 DIAGNOSIS — R591 Generalized enlarged lymph nodes: Secondary | ICD-10-CM | POA: Diagnosis not present

## 2015-08-05 DIAGNOSIS — R05 Cough: Secondary | ICD-10-CM | POA: Diagnosis not present

## 2015-08-05 MED FILL — PANTOPRAZOLE SOD DR 40 MG T: 40 | 30 days supply | Qty: 60 | Fill #0

## 2015-10-11 DIAGNOSIS — N390 Urinary tract infection, site not specified: Secondary | ICD-10-CM | POA: Diagnosis not present

## 2015-10-11 DIAGNOSIS — R300 Dysuria: Secondary | ICD-10-CM | POA: Diagnosis not present

## 2015-10-12 MED FILL — CIPROFLOXACIN HCL 500 MG TA: 500 | 10 days supply | Qty: 20 | Fill #0

## 2015-10-12 MED FILL — PANTOPRAZOLE SOD DR 40 MG T: 40 | 30 days supply | Qty: 60 | Fill #1

## 2015-10-13 DIAGNOSIS — R591 Generalized enlarged lymph nodes: Secondary | ICD-10-CM | POA: Diagnosis not present

## 2015-11-15 DIAGNOSIS — Z6827 Body mass index (BMI) 27.0-27.9, adult: Secondary | ICD-10-CM | POA: Diagnosis not present

## 2015-11-15 DIAGNOSIS — Z124 Encounter for screening for malignant neoplasm of cervix: Secondary | ICD-10-CM | POA: Diagnosis not present

## 2015-11-15 DIAGNOSIS — Z01419 Encounter for gynecological examination (general) (routine) without abnormal findings: Secondary | ICD-10-CM | POA: Diagnosis not present

## 2015-11-15 DIAGNOSIS — N72 Inflammatory disease of cervix uteri: Secondary | ICD-10-CM | POA: Diagnosis not present

## 2015-11-15 DIAGNOSIS — Z1231 Encounter for screening mammogram for malignant neoplasm of breast: Secondary | ICD-10-CM | POA: Diagnosis not present

## 2015-11-15 MED FILL — VALACYCLOVIR HCL 500 MG TAB: 500 | 45 days supply | Qty: 90 | Fill #0

## 2016-02-02 MED FILL — PANTOPRAZOLE SOD DR 40 MG T: 40 | 30 days supply | Qty: 60 | Fill #2

## 2016-03-22 DIAGNOSIS — H5213 Myopia, bilateral: Secondary | ICD-10-CM | POA: Diagnosis not present

## 2016-04-27 MED FILL — AMOXICILLIN 500 MG CAPSULE: 500 | 7 days supply | Qty: 21 | Fill #0

## 2016-04-27 MED FILL — HYDROCODON-APAP 5-325: 5-325 | 5 days supply | Qty: 20 | Fill #0

## 2016-10-23 ENCOUNTER — Ambulatory Visit (INDEPENDENT_AMBULATORY_CARE_PROVIDER_SITE_OTHER): Payer: 59 | Admitting: Family Medicine

## 2016-10-23 ENCOUNTER — Encounter: Payer: Self-pay | Admitting: Family Medicine

## 2016-10-23 VITALS — BP 138/88 | HR 88 | Temp 98.8°F | Resp 18 | Ht 62.0 in | Wt 137.0 lb

## 2016-10-23 DIAGNOSIS — R05 Cough: Secondary | ICD-10-CM

## 2016-10-23 DIAGNOSIS — R030 Elevated blood-pressure reading, without diagnosis of hypertension: Secondary | ICD-10-CM

## 2016-10-23 DIAGNOSIS — J301 Allergic rhinitis due to pollen: Secondary | ICD-10-CM

## 2016-10-23 DIAGNOSIS — R059 Cough, unspecified: Secondary | ICD-10-CM

## 2016-10-23 NOTE — Patient Instructions (Addendum)
 IF you received an x-ray today, you will receive an invoice from [facility]. Please contact [facility] at [phone] with questions or concerns regarding your invoice.   IF you received labwork today, you will receive an invoice from [facility]. Please contact [facility] at [phone] with questions or concerns regarding your invoice.   Our billing staff will not be able to assist you with questions regarding bills from these companies.  You will be contacted with the lab results as soon as they are available. The fastest way to get your results is to activate your My Chart account. Instructions are located on the last page of this paperwork. If you have not heard from us regarding the results in 2 weeks, please contact this office.      Preventing Hypertension Hypertension, commonly called high blood pressure, is when the force of blood pumping through the arteries is too strong. Arteries are blood vessels that carry blood from the heart throughout the body. Over time, hypertension can damage the arteries and decrease blood flow to important parts of the body, including the brain, heart, and kidneys. Often, hypertension does not cause symptoms until blood pressure is very high. For this reason, it is important to have your blood pressure checked on a regular basis. Hypertension can often be prevented with diet and lifestyle changes. If you already have hypertension, you can control it with diet and lifestyle changes, as well as medicine. What nutrition changes can be made? Maintain a healthy diet. This includes:  Eating less salt (sodium). Ask your health care provider how much sodium is safe for you to have. The general recommendation is to consume less than 1 tsp (2,300 mg) of sodium a day. ? Do not add salt to your food. ? Choose low-sodium options when grocery shopping and eating out.  Limiting fats in your diet. You can do this by eating low-fat or fat-free dairy  products and by eating less red meat.  Eating more fruits, vegetables, and whole grains. Make a goal to eat: ? 1-2 cups of fresh fruits and vegetables each day. ? 3-4 servings of whole grains each day.  Avoiding foods and beverages that have added sugars.  Eating fish that contain healthy fats (omega-3 fatty acids), such as mackerel or salmon.  If you need help putting together a healthy eating plan, try the DASH diet. This diet is high in fruits, vegetables, and whole grains. It is low in sodium, red meat, and added sugars. DASH stands for Dietary Approaches to Stop Hypertension. What lifestyle changes can be made?  Lose weight if you are overweight. Losing just 3?5% of your body weight can help prevent or control hypertension. ? For example, if your present weight is 200 lb (91 kg), a loss of 3-5% of your weight means losing 6-10 lb (2.7-4.5 kg). ? Ask your health care provider to help you with a diet and exercise plan to safely lose weight.  Get enough exercise. Do at least 150 minutes of moderate-intensity exercise each week. ? You could do this in short exercise sessions several times a day, or you could do longer exercise sessions a few times a week. For example, you could take a brisk 10-minute walk or bike ride, 3 times a day, for 5 days a week.  Find ways to reduce stress, such as exercising, meditating, listening to music, or taking a yoga class. If you need help reducing stress, ask your health care provider.  Do not smoke. This includes e-cigarettes.   Chemicals in tobacco and nicotine products raise your blood pressure each time you smoke. If you need help quitting, ask your health care provider.  Avoid alcohol. If you drink alcohol, limit alcohol intake to no more than 1 drink a day for nonpregnant women and 2 drinks a day for men. One drink equals 12 oz of beer, 5 oz of wine, or 1 oz of hard liquor. Why are these changes important? Diet and lifestyle changes can help you  prevent hypertension, and they may make you feel better overall and improve your quality of life. If you have hypertension, making these changes will help you control it and help prevent major complications, such as:  Hardening and narrowing of arteries that supply blood to: ? Your heart. This can cause a heart attack. ? Your brain. This can cause a stroke. ? Your kidneys. This can cause kidney failure.  Stress on your heart muscle, which can cause heart failure.  What can I do to lower my risk?  Work with your health care provider to make a hypertension prevention plan that works for you. Follow your plan and keep all follow-up visits as told by your health care provider.  Learn how to check your blood pressure at home. Make sure that you know your personal target blood pressure, as told by your health care provider. How is this treated? In addition to diet and lifestyle changes, your health care provider may recommend medicines to help lower your blood pressure. You may need to try a few different medicines to find what works best for you. You also may need to take more than one medicine. Take over-the-counter and prescription medicines only as told by your health care provider. Where to find support: Your health care provider can help you prevent hypertension and help you keep your blood pressure at a healthy level. Your local hospital or your community may also provide support services and prevention programs. The American Heart Association offers an online support network at: http://supportnetwork.heart.org/high-blood-pressure Where to find more information: Learn more about hypertension from:  National Heart, Lung, and Blood Institute: www.nhlbi.nih.gov/health/health-topics/topics/hbp  Centers for Disease Control and Prevention: www.cdc.gov/bloodpressure  American Academy of Family Physicians:  http://familydoctor.org/familydoctor/en/diseases-conditions/high-blood-pressure.printerview.all.html  Learn more about the DASH diet from:  National Heart, Lung, and Blood Institute: www.nhlbi.nih.gov/health/health-topics/topics/dash  Contact a health care provider if:  You think you are having a reaction to medicines you have taken.  You have recurrent headaches or feel dizzy.  You have swelling in your ankles.  You have trouble with your vision. Summary  Hypertension often does not cause any symptoms until blood pressure is very high. It is important to get your blood pressure checked regularly.  Diet and lifestyle changes are the most important steps in preventing hypertension.  By keeping your blood pressure in a healthy range, you can prevent complications like heart attack, heart failure, stroke, and kidney failure.  Work with your health care provider to make a hypertension prevention plan that works for you. This information is not intended to replace advice given to you by your health care provider. Make sure you discuss any questions you have with your health care provider. Document Released: 01/03/2015 Document Revised: 08/30/2015 Document Reviewed: 08/30/2015 Elsevier Interactive Patient Education  2017 Elsevier Inc.  

## 2016-10-23 NOTE — Progress Notes (Signed)
Chief Complaint  Patient presents with  . New Patient (Initial Visit)    establish care, elevated bp's at dental visits, cough for yrs-pcp did cxr-clear, saw gi thought it was gerd-given protonix but didn't help, saw ENT thought it was post nasal drip/sinus given zyrtec still taking, coughing episodes that last for hours but then go away    HPI   Elevated bp Pt reports that she has a history of elevated bp at the dental appointments She reports that she gets it checked with the automatic machine  She goes to OB and her bp is normal At the dentist 140/100 around 09/23/2016 She denies headaches, chest pains, palpitations She denies family history of CAD or stroke  Family History  Problem Relation Age of Onset  . Diabetes Mother   . Cancer Father        HEAD,NECK, THROAT  . Diabetes Maternal Aunt   . Diabetes Maternal Uncle   . Diabetes Paternal Grandmother     Cough She reports that she gets cough that seems like it is right in her throat Her cxrs were clear She was treated for reflux and ultimately ENT started zyrtec for postnasal drip She states that her cough is better and seems less noticable Her typical cough is very raspy but non productive and is so forceful that she gets leakage of urine  Allergic rhinitis Pt on zyrtec for postnasal drip She has a rare cough No itching of ears and throat   Past Medical History:  Diagnosis Date  . Nausea & vomiting   . SAB (spontaneous abortion)    X2    Current Outpatient Prescriptions  Medication Sig Dispense Refill  . cetirizine (ZYRTEC) 10 MG tablet Take 10 mg by mouth daily.     No current facility-administered medications for this visit.     Allergies: No Known Allergies  Past Surgical History:  Procedure Laterality Date  . CHOLECYSTECTOMY      Social History   Social History  . Marital status: Married    Spouse name: N/A  . Number of children: N/A  . Years of education: N/A   Social History Main Topics    . Smoking status: Never Smoker  . Smokeless tobacco: Never Used  . Alcohol use Yes  . Drug use: No  . Sexual activity: Yes    Birth control/ protection: Surgical     Comment: BTL   Other Topics Concern  . None   Social History Narrative  . None    Family History  Problem Relation Age of Onset  . Diabetes Mother   . Cancer Father        HEAD,NECK, THROAT  . Diabetes Maternal Aunt   . Diabetes Maternal Uncle   . Diabetes Paternal Grandmother      ROS Review of Systems See HPI Constitution: No fevers or chills No malaise No diaphoresis Skin: No rash or itching Eyes: no blurry vision, no double vision GU: no dysuria or hematuria Neuro: no dizziness or headaches all others reviewed and negative   Objective: Vitals:   10/23/16 1209 10/23/16 1304  BP: 138/88   Pulse: (!) 120 88  Resp: 18   Temp: 98.8 F (37.1 C)   TempSrc: Oral   SpO2: 98%   Weight: 137 lb (62.1 kg)   Height: 5\' 2"  (1.575 m)   Body mass index is 25.06 kg/m.   Physical Exam  Constitutional: She is oriented to person, place, and time. She appears well-developed and well-nourished.  HENT:  Head: Normocephalic and atraumatic.  Eyes: Conjunctivae and EOM are normal.  Neck: Normal range of motion. No thyromegaly present.  Cardiovascular: Normal rate, regular rhythm and normal heart sounds.   Pulmonary/Chest: Effort normal and breath sounds normal. No respiratory distress. She has no wheezes.  Neurological: She is alert and oriented to person, place, and time.  Psychiatric: She has a normal mood and affect. Her behavior is normal. Judgment and thought content normal.       Assessment and Plan Karen Cameron was seen today for new patient (initial visit).  Diagnoses and all orders for this visit:  Prehypertension- will observe and if elevated over 140/90 will start HCTZ  Cough- improved with zyrtec  Seasonal allergic rhinitis due to pollen- continue zyrtec      Jarmaine Ehrler A Creta Levin

## 2017-11-09 ENCOUNTER — Telehealth: Payer: 59 | Admitting: Family

## 2017-11-09 DIAGNOSIS — N390 Urinary tract infection, site not specified: Secondary | ICD-10-CM | POA: Diagnosis not present

## 2017-11-09 MED ORDER — CEPHALEXIN 500 MG PO CAPS: 500.0000 mg | ORAL_CAPSULE | Freq: Two times a day (BID) | ORAL | 0 refills | Status: DC

## 2017-11-09 MED FILL — CEPHALEXIN 500 MG CAPSULE: 500 | 7 days supply | Qty: 14 | Fill #0

## 2017-11-09 NOTE — Progress Notes (Signed)

## 2018-01-07 DIAGNOSIS — Z01419 Encounter for gynecological examination (general) (routine) without abnormal findings: Secondary | ICD-10-CM | POA: Diagnosis not present

## 2018-01-07 DIAGNOSIS — Z1231 Encounter for screening mammogram for malignant neoplasm of breast: Secondary | ICD-10-CM | POA: Diagnosis not present

## 2018-01-07 DIAGNOSIS — Z124 Encounter for screening for malignant neoplasm of cervix: Secondary | ICD-10-CM | POA: Diagnosis not present

## 2018-01-07 DIAGNOSIS — R87618 Other abnormal cytological findings on specimens from cervix uteri: Secondary | ICD-10-CM | POA: Diagnosis not present

## 2018-01-07 DIAGNOSIS — Z6824 Body mass index (BMI) 24.0-24.9, adult: Secondary | ICD-10-CM | POA: Diagnosis not present

## 2018-04-16 ENCOUNTER — Other Ambulatory Visit: Payer: Self-pay

## 2018-04-16 DIAGNOSIS — Z130 Encounter for screening for diseases of the blood and blood-forming organs and certain disorders involving the immune mechanism: Secondary | ICD-10-CM

## 2018-04-16 DIAGNOSIS — Z1329 Encounter for screening for other suspected endocrine disorder: Secondary | ICD-10-CM

## 2018-04-16 DIAGNOSIS — Z13220 Encounter for screening for lipoid disorders: Secondary | ICD-10-CM

## 2018-04-16 DIAGNOSIS — Z13228 Encounter for screening for other metabolic disorders: Secondary | ICD-10-CM

## 2018-04-16 DIAGNOSIS — Z1389 Encounter for screening for other disorder: Secondary | ICD-10-CM

## 2018-04-22 ENCOUNTER — Other Ambulatory Visit: Payer: Self-pay

## 2018-04-22 ENCOUNTER — Telehealth (INDEPENDENT_AMBULATORY_CARE_PROVIDER_SITE_OTHER): Payer: 59 | Admitting: Family Medicine

## 2018-04-22 ENCOUNTER — Ambulatory Visit (INDEPENDENT_AMBULATORY_CARE_PROVIDER_SITE_OTHER): Payer: 59 | Admitting: Family Medicine

## 2018-04-22 VITALS — BP 142/92 | HR 100 | Temp 98.4°F | Resp 16 | Ht 62.0 in | Wt 136.0 lb

## 2018-04-22 DIAGNOSIS — Z1329 Encounter for screening for other suspected endocrine disorder: Secondary | ICD-10-CM

## 2018-04-22 DIAGNOSIS — Z13220 Encounter for screening for lipoid disorders: Secondary | ICD-10-CM | POA: Diagnosis not present

## 2018-04-22 DIAGNOSIS — Z0000 Encounter for general adult medical examination without abnormal findings: Secondary | ICD-10-CM

## 2018-04-22 DIAGNOSIS — Z833 Family history of diabetes mellitus: Secondary | ICD-10-CM | POA: Diagnosis not present

## 2018-04-22 DIAGNOSIS — R030 Elevated blood-pressure reading, without diagnosis of hypertension: Secondary | ICD-10-CM | POA: Diagnosis not present

## 2018-04-22 DIAGNOSIS — Z131 Encounter for screening for diabetes mellitus: Secondary | ICD-10-CM | POA: Diagnosis not present

## 2018-04-22 DIAGNOSIS — Z0001 Encounter for general adult medical examination with abnormal findings: Secondary | ICD-10-CM

## 2018-04-22 DIAGNOSIS — I10 Essential (primary) hypertension: Secondary | ICD-10-CM | POA: Diagnosis not present

## 2018-04-22 DIAGNOSIS — Z1389 Encounter for screening for other disorder: Secondary | ICD-10-CM | POA: Diagnosis not present

## 2018-04-22 DIAGNOSIS — Z13228 Encounter for screening for other metabolic disorders: Secondary | ICD-10-CM | POA: Diagnosis not present

## 2018-04-22 DIAGNOSIS — R059 Cough, unspecified: Secondary | ICD-10-CM

## 2018-04-22 DIAGNOSIS — R05 Cough: Secondary | ICD-10-CM | POA: Diagnosis not present

## 2018-04-22 DIAGNOSIS — J301 Allergic rhinitis due to pollen: Secondary | ICD-10-CM | POA: Diagnosis not present

## 2018-04-22 DIAGNOSIS — Z130 Encounter for screening for diseases of the blood and blood-forming organs and certain disorders involving the immune mechanism: Secondary | ICD-10-CM | POA: Diagnosis not present

## 2018-04-22 LAB — POCT URINALYSIS DIP (MANUAL ENTRY)
Bilirubin, UA: NEGATIVE
Blood, UA: NEGATIVE
Glucose, UA: NEGATIVE mg/dL
Ketones, POC UA: NEGATIVE mg/dL
Leukocytes, UA: NEGATIVE
Nitrite, UA: NEGATIVE
Protein Ur, POC: NEGATIVE mg/dL
Spec Grav, UA: 1.015 (ref 1.010–1.025)
Urobilinogen, UA: 0.2 E.U./dL
pH, UA: 7 (ref 5.0–8.0)

## 2018-04-22 MED ORDER — LEVOCETIRIZINE DIHYDROCHLORIDE 5 MG PO TABS: 5.0000 mg | ORAL_TABLET | Freq: Every evening | ORAL | 6 refills | Status: DC

## 2018-04-22 MED FILL — LEVOCETIRIZINE 5 MG TABLET: 5 | 30 days supply | Qty: 30 | Fill #0

## 2018-04-22 NOTE — Progress Notes (Signed)
Video Encounter- SOAP NOTE Established Patient  This telephone encounter was conducted with the patient's (or proxy's) verbal consent via audio telecommunications: yes/no: Yes Patient was instructed to have this encounter in a suitably private space; and to only have persons present to whom they give permission to participate. In addition, patient identity was confirmed by use of name plus two identifiers (DOB and address).  I discussed the limitations, risks, security and privacy concerns of performing an evaluation and management service by telephone and the availability of in person appointments. I also discussed with the patient that there may be a patient responsible charge related to this service. The patient expressed understanding and agreed to proceed.   CC: cough, physical   Subjective   Karen Cameron is a 53 y.o. established patient. Telephone visit today for  HPI  Cough Reports that it is ongoing for years States that she takes zyrtec She has never had allergy testing She states that it still happens on cruises She denies reflux or heart burn She has been to ENT and GI She states that she has felt shortness of breath with coughing She states that she typically has to wait until the cough clears up She waits a minute or two and can breathe normally She denies history of asthma and has no family history of asthma She denies any allergy testing   Hypertension Patient reports that she is avoiding salty foods She is exercising She denies chest pains, palpitations, shortness of breath  BP Readings from Last 3 Encounters:  04/22/18 (!) 142/92  10/23/16 138/88  12/12/13 (!) 142/85     Health Maintenance Exam  [facility] She is up to date on her pap smear and mammogram She has not had a colonoscopy She denies any dental or vision problems and is up to date on her screenings She exercises weekly She does not smoke  Family History  Diabetes Mellitus in her mother    Patient Active Problem List   Diagnosis Date Noted  . 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 Nausea & vomiting   . SAB (spontaneous abortion)    X2    Current Outpatient Medications  Medication Sig Dispense Refill  . cetirizine (ZYRTEC) 10 MG tablet Take 10 mg by mouth daily.    . cephALEXin (KEFLEX) 500 MG capsule Take 1 capsule (500 mg total) by mouth 2 (two) times daily. (Patient not taking: Reported on 04/22/2018) 14 capsule 0  . levocetirizine (XYZAL) 5 MG tablet Take 1 tablet (5 mg total) by mouth every evening. 30 tablet 6   No current facility-administered medications for this visit.     No Known Allergies  Social History   Socioeconomic History  . Marital status: Married    Spouse name: Not on file  . Number of children: Not on file  . Years of education: Not on file  . Highest education level: Not on file  Occupational History  . Not on file  Social Needs  . Financial resource strain: Not on file  . Food insecurity:    Worry: Not on file    Inability: Not on file  . Transportation needs:    Medical: Not on file    Non-medical: Not on file  Tobacco Use  . Smoking status: Never Smoker  . Smokeless tobacco: Never Used  Substance and Sexual Activity  . Alcohol use: Yes  . Drug use: No  .  Sexual activity: Yes    Birth control/protection: Surgical    Comment: BTL  Lifestyle  . Physical activity:    Days per week: Not on file    Minutes per session: Not on file  . Stress: Not on file  Relationships  . Social connections:    Talks on phone: Not on file    Gets together: Not on file    Attends religious service: Not on file    Active member of club or organization: Not on file    Attends meetings of clubs or organizations: Not on file    Relationship status: Not on file  . Intimate partner violence:    Fear of current or ex partner: Not on file    Emotionally abused: Not on file     Physically abused: Not on file    Forced sexual activity: Not on file  Other Topics Concern  . Not on file  Social History Narrative  . Not on file   Review of Systems  Constitutional: Negative for activity change, appetite change, chills and fever.  HENT: Negative for congestion, nosebleeds, trouble swallowing and voice change.   Respiratory: Negative for cough, shortness of breath and wheezing.   Gastrointestinal: Negative for diarrhea, nausea and vomiting.  Genitourinary: Negative for difficulty urinating, dysuria, flank pain and hematuria.  Musculoskeletal: Negative for back pain, joint swelling and neck pain.  Neurological: Negative for dizziness, speech difficulty, light-headedness and numbness.  See HPI. All other review of systems negative.   ROS  Objective   Vitals as reported by the patient: Today's Vitals   04/22/18 1148  BP: (!) 142/92  Pulse: 100  Resp: 16  Temp: 98.4 F (36.9 C)  TempSrc: Oral  SpO2: 100%  Weight: 136 lb (61.7 kg)  Height:  (1.575 m)   Physical Exam Constitutional:      Appearance: Normal appearance.  HENT:     Head: Normocephalic and atraumatic.     Nose: Nose normal. No congestion.  Eyes:     General: No scleral icterus.    Extraocular Movements: Extraocular movements intact.  Pulmonary:     Effort: Pulmonary effort is normal.     Breath sounds: No wheezing.  Skin:    Coloration: Skin is not jaundiced.     Findings: No rash.  Neurological:     Mental Status: She is alert and oriented to person, place, and time.     Cranial Nerves: No cranial nerve deficit.  Psychiatric:        Mood and Affect: Mood normal.        Behavior: Behavior normal.        Thought Content: Thought content normal.        Judgment: Judgment normal.      Diagnoses and all orders for this visit:  Encounter for health maintenance examination in adult- Women's Health Maintenance Plan Advised monthly breast exam and annual mammogram Advised dental  exam every six months Discussed stress management Discussed pap smear screening guidelines  Cough -     Ambulatory referral to Allergy  Seasonal allergic rhinitis due to pollen- switched zyrtec to xyzal -     Ambulatory referral to Allergy -     levocetirizine (XYZAL) 5 MG tablet; Take 1 tablet (5 mg total) by mouth every evening.  Screening for hematuria or proteinuria- in patient with a new diagnosis of hypertension -     POCT urinalysis dipstick  Screening for diabetes mellitus -     POCT  urinalysis dipstick -     Comprehensive metabolic panel  Family history of diabetes mellitus (DM)- family history in mother  -     TSH -     Comprehensive metabolic panel  Essential Hypertension- patient was prehypertensive last visit However she has two elevated bp readings on two separate visits Would advise lifestyle modification Will discuss HCTZ once renal function is reviewed -     Lipid panel -     TSH -     Comprehensive metabolic panel  Screening for cholesterol level -     Lipid panel -     Comprehensive metabolic panel  Screening for thyroid disorder -     TSH -     Comprehensive metabolic panel -     CBC with Differential/Platelet  Screening for metabolic disorder -     Comprehensive metabolic panel -     CBC with Differential/Platelet  Screening for deficiency anemia -     CBC with Differential/Platelet     I discussed the assessment and treatment plan with the patient. The patient was provided an opportunity to ask questions and all were answered. The patient agreed with the plan and demonstrated an understanding of the instructions.   The patient was advised to call back or seek an in-person evaluation if the symptoms worsen or if the condition fails to improve as anticipated.  Visit was conducted by secure video on [email or web address]  Followed by in office labs and nurse visit for vitals   Doristine Bosworth, MD  Primary Care at [facility]

## 2018-04-22 NOTE — Patient Instructions (Signed)
° ° ° °  If you have lab work done today you will be contacted with your lab results within the next 2 weeks.  If you have not heard from us then please contact us. The fastest way to get your results is to register for My Chart. ° ° °IF you received an x-ray today, you will receive an invoice from [facility]. Please contact [facility] at [phone] with questions or concerns regarding your invoice.  ° °IF you received labwork today, you will receive an invoice from [facility]. Please contact [facility] at [phone] with questions or concerns regarding your invoice.  ° °Our billing staff will not be able to assist you with questions regarding bills from these companies. ° °You will be contacted with the lab results as soon as they are available. The fastest way to get your results is to activate your My Chart account. Instructions are located on the last page of this paperwork. If you have not heard from us regarding the results in 2 weeks, please contact this office. °  ° ° ° °

## 2018-04-22 NOTE — Progress Notes (Signed)
Cc: Physical.  Cough for years that no one can seem to figure out.  Pt would like refill on zyrtec.  No travel outside [place] or [place] in the past 3 weeks.

## 2018-04-23 ENCOUNTER — Other Ambulatory Visit: Payer: Self-pay | Admitting: Family Medicine

## 2018-04-23 LAB — COMPREHENSIVE METABOLIC PANEL
ALT: 17 IU/L (ref 0–32)
AST: 22 IU/L (ref 0–40)
Albumin/Globulin Ratio: 1.8 (ref 1.2–2.2)
Albumin: 4.6 g/dL (ref 3.8–4.8)
Alkaline Phosphatase: 85 IU/L (ref 39–117)
BUN/Creatinine Ratio: 10 (ref 9–23)
BUN: 9 mg/dL (ref 6–24)
Bilirubin Total: 0.5 mg/dL (ref 0.0–1.2)
CO2: 21 mmol/L (ref 20–29)
Calcium: 9.2 mg/dL (ref 8.7–10.2)
Chloride: 97 mmol/L (ref 96–106)
Creatinine, Ser: 0.92 mg/dL (ref 0.57–1.00)
GFR calc Af Amer: 84 mL/min/{1.73_m2} (ref >59)
GFR calc non Af Amer: 73 mL/min/{1.73_m2} (ref >59)
Globulin, Total: 2.6 g/dL (ref 1.5–4.5)
Glucose: 74 mg/dL (ref 65–99)
Potassium: 4.5 mmol/L (ref 3.5–5.2)
Sodium: 138 mmol/L (ref 134–144)
Total Protein: 7.2 g/dL (ref 6.0–8.5)

## 2018-04-23 LAB — CBC WITH DIFFERENTIAL/PLATELET
Basophils Absolute: 0.1 10*3/uL (ref 0.0–0.2)
Basos: 1 %
EOS (ABSOLUTE): 0.1 10*3/uL (ref 0.0–0.4)
Eos: 2 %
Hematocrit: 39.9 % (ref 34.0–46.6)
Hemoglobin: 13 g/dL (ref 11.1–15.9)
Immature Grans (Abs): 0 10*3/uL (ref 0.0–0.1)
Immature Granulocytes: 0 %
Lymphocytes Absolute: 2.3 10*3/uL (ref 0.7–3.1)
Lymphs: 43 %
MCH: 28.3 pg (ref 26.6–33.0)
MCHC: 32.6 g/dL (ref 31.5–35.7)
MCV: 87 fL (ref 79–97)
Monocytes Absolute: 0.4 10*3/uL (ref 0.1–0.9)
Monocytes: 8 %
Neutrophils Absolute: 2.5 10*3/uL (ref 1.4–7.0)
Neutrophils: 46 %
Platelets: 328 10*3/uL (ref 150–450)
RBC: 4.59 x10E6/uL (ref 3.77–5.28)
RDW: 11.8 % (ref 11.7–15.4)
WBC: 5.5 10*3/uL (ref 3.4–10.8)

## 2018-04-23 LAB — LIPID PANEL
Chol/HDL Ratio: 2.6 ratio (ref 0.0–4.4)
Cholesterol, Total: 189 mg/dL (ref 100–199)
HDL: 74 mg/dL (ref >39)
LDL Calculated: 106 mg/dL — ABNORMAL HIGH (ref 0–99)
Triglycerides: 45 mg/dL (ref 0–149)
VLDL Cholesterol Cal: 9 mg/dL (ref 5–40)

## 2018-04-23 LAB — TSH: TSH: 1.19 u[IU]/mL (ref 0.450–4.500)

## 2018-04-23 MED ORDER — HYDROCHLOROTHIAZIDE 12.5 MG PO CAPS: 12.5000 mg | ORAL_CAPSULE | Freq: Every day | ORAL | 0 refills | Status: DC

## 2018-04-23 MED FILL — HYDROCHLOROTHIAZIDE 12.5 MG: 12.5 | 90 days supply | Qty: 90 | Fill #0

## 2018-05-08 ENCOUNTER — Other Ambulatory Visit: Payer: Self-pay

## 2018-05-08 ENCOUNTER — Encounter: Payer: Self-pay | Admitting: Allergy

## 2018-05-08 ENCOUNTER — Ambulatory Visit: Payer: 59 | Admitting: Allergy

## 2018-05-08 VITALS — BP 122/87 | HR 98 | Temp 98.5°F | Resp 16 | Ht 62.0 in | Wt 134.0 lb

## 2018-05-08 DIAGNOSIS — R05 Cough: Secondary | ICD-10-CM

## 2018-05-08 DIAGNOSIS — R053 Chronic cough: Secondary | ICD-10-CM | POA: Insufficient documentation

## 2018-05-08 DIAGNOSIS — J3089 Other allergic rhinitis: Secondary | ICD-10-CM | POA: Diagnosis not present

## 2018-05-08 DIAGNOSIS — J3081 Allergic rhinitis due to animal (cat) (dog) hair and dander: Secondary | ICD-10-CM | POA: Diagnosis not present

## 2018-05-08 MED ORDER — MONTELUKAST SODIUM 10 MG PO TABS: 10.0000 mg | ORAL_TABLET | Freq: Every day | ORAL | 3 refills | Status: DC

## 2018-05-08 MED ORDER — ALBUTEROL SULFATE HFA 108 (90 BASE) MCG/ACT IN AERS: 2.0000 | INHALATION_SPRAY | RESPIRATORY_TRACT | 2 refills | Status: DC | PRN

## 2018-05-08 MED FILL — ALBUTEROL SULFATE HFA 108 (: 108 (90 BAS | 16 days supply | Qty: 18 | Fill #0

## 2018-05-08 MED FILL — MONTELUKAST SOD 10 MG TAB: 10 | 30 days supply | Qty: 30 | Fill #0

## 2018-05-08 NOTE — Patient Instructions (Addendum)
Today's skin testing showed: Positive to mold and cat. Negative to basic foods and cinnamon  Coughing: . Rescue medications: May use albuterol rescue inhaler 2 puffs or nebulizer every 4 to 6 hours as needed for shortness of breath, chest tightness, coughing, and wheezing. Monitor frequency of use.    Start Singulair 10mg  daily  Cautioned that in some children/adults can experience behavioral changes including hyperactivity, agitation, depression, sleep disturbances and suicidal ideations. These side effects are rare, but if you notice them you should notify me and discontinue Singulair (montelukast).  Follow up in 4 weeks   Mold Control . Mold and fungi can grow on a variety of surfaces provided certain temperature and moisture conditions exist.  . Outdoor molds grow on plants, decaying vegetation and soil. The major outdoor mold, Alternaria and Cladosporium, are found in very high numbers during hot and dry conditions. Generally, a late summer - fall peak is seen for common outdoor fungal spores. Rain will temporarily lower outdoor mold spore count, but counts rise rapidly when the rainy period ends. . The most important indoor molds are Aspergillus and Penicillium. Dark, humid and poorly ventilated basements are ideal sites for mold growth. The next most common sites of mold growth are the bathroom and the kitchen. Outdoor (Seasonal) Mold Control . Use air conditioning and keep windows closed. . Avoid exposure to decaying vegetation. Marland Kitchen Avoid leaf raking. . Avoid grain handling. . Consider wearing a face mask if working in moldy areas.  Indoor (Perennial) Mold Control  . Maintain humidity below 50%. . Get rid of mold growth on hard surfaces with water, detergent and, if necessary, 5% bleach (do not mix with other cleaners). Then dry the area completely. If mold covers an area more than 10 square feet, consider hiring an indoor environmental professional. . For clothing, washing with  soap and water is best. If moldy items cannot be cleaned and dried, throw them away. . Remove sources e.g. contaminated carpets. . Repair and seal leaking roofs or pipes. Using dehumidifiers in damp basements may be helpful, but empty the water and clean units regularly to prevent mildew from forming. All rooms, especially basements, bathrooms and kitchens, require ventilation and cleaning to deter mold and mildew growth. Avoid carpeting on concrete or damp floors, and storing items in damp areas. Pet Allergen Avoidance: . Contrary to popular opinion, there are no "hypoallergenic" breeds of dogs or cats. That is because people are not allergic to an animal's hair, but to an allergen found in the animal's saliva, dander (dead skin flakes) or urine. Pet allergy symptoms typically occur within minutes. For some people, symptoms can build up and become most severe 8 to 12 hours after contact with the animal. People with severe allergies can experience reactions in public places if dander has been transported on the pet owners' clothing. Marland Kitchen Keeping an animal outdoors is only a partial solution, since homes with pets in the yard still have higher concentrations of animal allergens. . Before getting a pet, ask your allergist to determine if you are allergic to animals. If your pet is already considered part of your family, try to minimize contact and keep the pet out of the bedroom and other rooms where you spend a great deal of time. . As with dust mites, vacuum carpets often or replace carpet with a hardwood floor, tile or linoleum. . High-efficiency particulate air (HEPA) cleaners can reduce allergen levels over time. . While dander and saliva are the source of cat and  dog allergens, urine is the source of allergens from rabbits, hamsters, mice and [place]; so ask a non-allergic family member to clean the animal's cage. . If you have a pet allergy, talk to your allergist about the potential for allergy  immunotherapy (allergy shots). This strategy can often provide long-term relief.

## 2018-05-08 NOTE — Progress Notes (Signed)
New Patient Note  RE: Karen Cameron MRN: [id number] DOB: 30-Jan-1965 Date of Office Visit: 05/08/2018  Referring provider: Doristine Bosworth, MD Primary care provider: Doristine Bosworth, MD  Chief Complaint: Cough (Years)  History of Present Illness: I had the pleasure of seeing Karen Cameron for initial evaluation at the [facility] on 05/09/2018. She is a 53 y.o. female, who is referred here by Doristine Bosworth, MD for the evaluation of cough and seasonal allergies. Patient was seen by our office over 4 years ago for coughing and rhinitis.  Coughing:  She reports symptoms of coughing from the throat, rhinorrhea, sneezing. Symptoms have been going on for 10 years. The symptoms are present all year around. Other triggers include exposure to unsure. Anosmia: no. Headache: no. She has used Xyzal, zyrtec, allegra, Claritin, afrin, Flonase with minimal improvement in symptoms. Sinus infections: no. Previous work up includes: skin testing was done over 4 yrs ago which was positive to dust mites, cockroach and mold. No previous allergy injections. Previous ENT evaluation: yes and exam was unremarkable.  Previous sinus imaging: no.  Patient was seen by GI as well but had no EGD. She tried PPI with no benefit.   She reports symptoms of coughing for 10 years and gets coughing fits. Current medications include none. She reports none using aerochamber with inhalers. She tried the following inhalers: no. Main triggers are unknown. In the last month, frequency of symptoms: daily. Frequency of nocturnal symptoms: 0x/month. Frequency of SABA use: 0x/week. Interference with physical activity: no. Sleep is undisturbed. In the last 12 months, emergency room visits/urgent care visits/doctor office visits or hospitalizations due to respiratory issues: 0. In the last 12 months, oral steroids courses:no. Lifetime history of hospitalization for respiratory issues: 0. Prior intubations: 0. History of  pneumonia: 0. She was evaluated by allergist in the past. Smoking exposure: no. Up to date with flu vaccine: yes.   No recent CXR or CT chest.   Assessment and Plan: Karen Cameron is a 53 y.o. female with: Chronic cough Chronic cough for the past 10 years. No triggers noted but has coughing fits during the day. Tried OTC antihistamines, Flonase and Afrin with minimal benefit. Skin testing over 4 years ago was positive to dust mites, cockroach and mold. ENT evaluation unremarkable. Past CXR unremarkable. Trial of PPI no benefit.  Today's skin testing showed: Positive to mold and cat. Negative to basic foods and cinnamon.  Not sure how much of the above allergens are contributing to her coughing.   Today's spirometry was unremarkable. She did have a coughing fit during xopenex administration and post spirometry was worse than pre due to poor effort. The most common causes of chronic cough include the following: upper airway cough syndrome (UACS) which is caused by variety of rhinitis conditions; asthma; gastroesophageal reflux disease (GERD); chronic bronchitis from cigarette smoking or other inhaled environmental irritants; non-asthmatic eosinophilic bronchitis; and bronchiectasis.  In prospective studies, these conditions have accounted for up to 94% of the causes of chronic cough in immunocompetent adults.  Based on clinical history, physical exam and testing results concern if there's a component of reactive airway disease. Marland Kitchen Rescue medications: May use albuterol rescue inhaler 2 puffs or nebulizer every 4 to 6 hours as needed for shortness of breath, chest tightness, coughing, and wheezing. Monitor frequency of use.   Start Singulair  daily  Cautioned that in some children/adults can experience behavioral changes including hyperactivity, agitation, depression, sleep disturbances and  suicidal ideations. These side effects are rare, but if you notice them you should notify me and discontinue  Singulair (montelukast).  Other allergic rhinitis Some rhinitis symptoms for the past 10 years.  Today's skin testing showed: Positive to mold and cat.  Discussed environmental control measures.  Start Singulair  daily  Cautioned that in some children/adults can experience behavioral changes including hyperactivity, agitation, depression, sleep disturbances and suicidal ideations. These side effects are rare, but if you notice them you should notify me and discontinue Singulair (montelukast).  Will hold off nasal sprays for now as they were ineffective in the past.   Return in about 4 weeks (around 06/05/2018).  Meds ordered this encounter  Medications  . albuterol (VENTOLIN HFA) 108 (90 Base) MCG/ACT inhaler    Sig: Inhale 2 puffs into the lungs every 4 (four) hours as needed for wheezing or shortness of breath.    Dispense:  1 Inhaler    Refill:  2  . montelukast (SINGULAIR) 10 MG tablet    Sig: Take 1 tablet (10 mg total) by mouth at bedtime.    Dispense:  30 tablet    Refill:  3   Other allergy screening: Asthma: no Rhino conjunctivitis: yes Food allergy: no Medication allergy: no Hymenoptera allergy: no Urticaria: no Eczema:no History of recurrent infections suggestive of immunodeficency: no  Diagnostics: Spirometry:  Tracings reviewed. Her effort: It was hard to get consistent efforts and there is a question as to whether this reflects a maximal maneuver. FVC: 2.28L FEV1: 2.11L, 99% predicted FEV1/FVC ratio: 93% Interpretation: patient had a coughing fit during xopenex administration and post bronchodilator treatment spirometry was worse most likely due to poor effort.  Please see scanned spirometry results for details.  Skin Testing: Environmental allergy panel and select foods. Positive test to: mold and cat. Negative test to: common foods and cinnamon.  Results discussed with patient/family. Airborne Adult Perc - 05/08/18 1519    Time Antigen Placed  0300     Allergen Manufacturer  Waynette Buttery    Location  Back    Number of Test  59    Panel 1  Select    1. Control-Buffer 50% Glycerol  Negative    2. Control-Histamine 1 mg/ml  3+    3. Albumin saline  Negative    4. Bahia  Negative    5. French Southern Territories  Negative    6. Johnson  Negative    7. Kentucky Blue  Negative    8. Meadow Fescue  Negative    9. Perennial Rye  Negative    10. Sweet Vernal  Negative    11. Timothy  Negative    12. Cocklebur  Negative    13. Burweed Marshelder  Negative    14. Ragweed, short  Negative    15. Ragweed, Giant  Negative    16. Plantain,  English  Negative    17. Lamb's Quarters  Negative    18. Sheep Sorrell  Negative    19. Rough Pigweed  Negative    20. Marsh Elder, Rough  Negative    21. Mugwort, Common  Negative    22. Ash mix  Negative    23. Birch mix  Negative    24. Beech American  Negative    25. Box, Elder  Negative    26. Cedar, red  Negative    27. Cottonwood, Guinea-Bissau  Negative    28. Elm mix  Negative    29. Hickory mix  Negative  30. Maple mix  Negative    31. Oak, Guinea-Bissau mix  Negative    32. Pecan Pollen  Negative    33. Pine mix  Negative    34. Sycamore Eastern  Negative    35. Walnut, Black Pollen  Negative    36. Alternaria alternata  Negative    37. Cladosporium Herbarum  Negative    38. Aspergillus mix  Negative    39. Penicillium mix  Negative    40. Bipolaris sorokiniana (Helminthosporium)  Negative    41. Drechslera spicifera (Curvularia)  Negative    42. Mucor plumbeus  Negative    43. Fusarium moniliforme  2+    44. Aureobasidium pullulans (pullulara)  Negative    45. Rhizopus oryzae  Negative    46. Botrytis cinera  Negative    47. Epicoccum nigrum  Negative    48. Phoma betae  Negative    49. Candida Albicans  Negative    50. Trichophyton mentagrophytes  Negative    51. Mite, D Farinae  5,000 AU/ml  Negative    52. Mite, D Pteronyssinus  5,000 AU/ml  Negative    53. Cat Hair 10,000 BAU/ml  Negative    54.  Dog  Epithelia  Negative    55. Mixed Feathers  Negative    56. Horse Epithelia  Negative    57. Cockroach, German  Negative    58. Mouse  Negative    59. Tobacco Leaf  Negative     Food Perc - 05/08/18 1519    Time Antigen Placed  0300    Allergen Manufacturer  Waynette Buttery    Location  Back    Number of allergen test  10    Food  Select    1. Peanut  Negative    2. Soybean food  Negative    3. Wheat, whole  Negative    4. Sesame  Negative    5. Milk, cow  Negative    6. Egg White, chicken  Negative    7. Casein  Negative    8. Shellfish mix  Negative    9. Fish mix  Negative    10. Cashew  Negative     Intradermal - 05/08/18 1518    Time Antigen Placed  0320    Allergen Manufacturer  Waynette Buttery    Location  Arm    Number of Test  14    Intradermal  Select    Control  Negative    French Southern Territories  Negative    Johnson  Negative    7 Grass  Negative    Ragweed mix  Negative    Weed mix  Negative    Tree mix  Negative    Mold 1  Negative    Mold 2  2+    Mold 3  Negative    Cat  2+    Dog  Negative    Cockroach  Negative    Mite mix  Negative     Food Adult Perc - 05/08/18 1500    Time Antigen Placed  0300    Allergen Manufacturer  Waynette Buttery    Location  Back    Number of allergen test  1       Past Medical History: Patient Active Problem List   Diagnosis Date Noted  . Other allergic rhinitis 05/09/2018  . Chronic cough 05/08/2018  . 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 Nausea & vomiting   .  SAB (spontaneous abortion)    X2   Past Surgical History: Past Surgical History:  Procedure Laterality Date  . CHOLECYSTECTOMY     Medication List:  Current Outpatient Medications  Medication Sig Dispense Refill  . hydrochlorothiazide (MICROZIDE) 12.5 MG capsule Take 1 capsule (12.5 mg total) by mouth daily. 90 capsule 0  . levocetirizine (XYZAL) 5 MG tablet Take 1 tablet (5 mg total) by mouth every evening. 30 tablet 6  . albuterol (VENTOLIN HFA) 108  (90 Base) MCG/ACT inhaler Inhale 2 puffs into the lungs every 4 (four) hours as needed for wheezing or shortness of breath. 1 Inhaler 2  . montelukast (SINGULAIR) 10 MG tablet Take 1 tablet (10 mg total) by mouth at bedtime. 30 tablet 3   No current facility-administered medications for this visit.    Allergies: Allergies  Allergen Reactions  . Other    Social History: Social History   Socioeconomic History  . Marital status: Married    Spouse name: Not on file  . Number of children: Not on file  . Years of education: Not on file  . Highest education level: Not on file  Occupational History  . Not on file  Social Needs  . Financial resource strain: Not on file  . Food insecurity:    Worry: Not on file    Inability: Not on file  . Transportation needs:    Medical: Not on file    Non-medical: Not on file  Tobacco Use  . Smoking status: Never Smoker  . Smokeless tobacco: Never Used  Substance and Sexual Activity  . Alcohol use: Yes  . Drug use: No  . Sexual activity: Yes    Birth control/protection: Surgical    Comment: BTL  Lifestyle  . Physical activity:    Days per week: Not on file    Minutes per session: Not on file  . Stress: Not on file  Relationships  . Social connections:    Talks on phone: Not on file    Gets together: Not on file    Attends religious service: Not on file    Active member of club or organization: Not on file    Attends meetings of clubs or organizations: Not on file    Relationship status: Not on file  Other Topics Concern  . Not on file  Social History Narrative  . Not on file   Lives in a 51 year old home. 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 Diabetes Mother   . Cancer Father         HEAD,NECK, THROAT  . Diabetes Maternal Aunt   . Diabetes Maternal Uncle   . Diabetes Paternal Grandmother   . Allergic rhinitis Neg Hx   . Asthma Neg Hx   . Eczema Neg Hx   . Urticaria Neg Hx    Review of Systems  Constitutional: Negative for appetite change, chills, fever and unexpected weight change.  HENT: Negative for congestion and rhinorrhea.   Eyes: Negative for itching.  Respiratory: Positive for cough. Negative for chest tightness, shortness of breath and wheezing.   Cardiovascular: Negative for chest pain.  Gastrointestinal: Negative for abdominal pain.  Genitourinary: Negative for difficulty urinating.  Skin: Negative for rash.  Allergic/Immunologic: Positive for environmental allergies. Negative  for food allergies.  Neurological: Negative for headaches.   Objective: BP 122/87 (BP Location: Right Arm, Patient Position: Sitting, Cuff Size: Normal)   Pulse 98   Temp 98.5 F (36.9 C) (Oral)   Resp 16   Ht 5\' 2"  (1.575 m)   Wt 134 lb (60.8 kg)   SpO2 98%   BMI 24.51 kg/m  Body mass index is 24.51 kg/m. Physical Exam  Constitutional: She is oriented to person, place, and time. She appears well-developed and well-nourished.  HENT:  Head: Normocephalic and atraumatic.  Right Ear: External ear normal.  Left Ear: External ear normal.  Nose: Nose normal.  Mouth/Throat: Oropharynx is clear and moist.  Eyes: Conjunctivae and EOM are normal.  Neck: Neck supple.  Cardiovascular: Normal rate, regular rhythm and normal heart sounds. Exam reveals no gallop and no friction rub.  No murmur heard. Pulmonary/Chest: Effort normal and breath sounds normal. She has no wheezes. She has no rales.  Abdominal: Soft.  Neurological: She is alert and oriented to person, place, and time.  Skin: Skin is warm. No rash noted.  Psychiatric: She has a normal mood and affect. Her behavior is normal.  Nursing note and vitals reviewed.  The plan was reviewed with the patient/family, and all  questions/concerned were addressed.  It was my pleasure to see Karen Cameron today and participate in her care. Please feel free to contact me with any questions or concerns.  Sincerely,  Wyline MoodYoon Aella Ronda, DO Allergy & Immunology  [facility] [place] office: [phone] [facility] office: [phone]

## 2018-05-09 ENCOUNTER — Encounter: Payer: Self-pay | Admitting: Allergy

## 2018-05-09 DIAGNOSIS — J3089 Other allergic rhinitis: Secondary | ICD-10-CM | POA: Insufficient documentation

## 2018-05-09 NOTE — Assessment & Plan Note (Signed)
Some rhinitis symptoms for the past 10 years.  Today's skin testing showed: Positive to mold and cat.  Discussed environmental control measures.  Start Singulair 10mg  daily  Cautioned that in some children/adults can experience behavioral changes including hyperactivity, agitation, depression, sleep disturbances and suicidal ideations. These side effects are rare, but if you notice them you should notify me and discontinue Singulair (montelukast).  Will hold off nasal sprays for now as they were ineffective in the past.

## 2018-05-09 NOTE — Assessment & Plan Note (Addendum)
Chronic cough for the past 10 years. No triggers noted but has coughing fits during the day. Tried OTC antihistamines, Flonase and Afrin with minimal benefit. Skin testing over 4 years ago was positive to dust mites, cockroach and mold. ENT evaluation unremarkable. Past CXR unremarkable. Trial of PPI no benefit.  Today's skin testing showed: Positive to mold and cat. Negative to basic foods and cinnamon.  Not sure how much of the above allergens are contributing to her coughing.   Today's spirometry was unremarkable. She did have a coughing fit during xopenex administration and post spirometry was worse than pre due to poor effort. The most common causes of chronic cough include the following: upper airway cough syndrome (UACS) which is caused by variety of rhinitis conditions; asthma; gastroesophageal reflux disease (GERD); chronic bronchitis from cigarette smoking or other inhaled environmental irritants; non-asthmatic eosinophilic bronchitis; and bronchiectasis.  In prospective studies, these conditions have accounted for up to 94% of the causes of chronic cough in immunocompetent adults.  Based on clinical history, physical exam and testing results concern if there's a component of reactive airway disease. Marland Kitchen Rescue medications: May use albuterol rescue inhaler 2 puffs or nebulizer every 4 to 6 hours as needed for shortness of breath, chest tightness, coughing, and wheezing. Monitor frequency of use.   Start Singulair 10mg  daily  Cautioned that in some children/adults can experience behavioral changes including hyperactivity, agitation, depression, sleep disturbances and suicidal ideations. These side effects are rare, but if you notice them you should notify me and discontinue Singulair (montelukast).  If coughing still persistent then may benefit from repeat CXR and/or CT chest.   Will also consider starting nasal sprays and PPI again at next visit if still symptomatic.

## 2018-05-30 MED FILL — LEVOCETIRIZINE 5 MG TABLET: 5 | 30 days supply | Qty: 30 | Fill #1

## 2018-06-05 ENCOUNTER — Ambulatory Visit (INDEPENDENT_AMBULATORY_CARE_PROVIDER_SITE_OTHER): Payer: 59 | Admitting: Allergy

## 2018-06-05 ENCOUNTER — Other Ambulatory Visit: Payer: Self-pay

## 2018-06-05 ENCOUNTER — Encounter: Payer: Self-pay | Admitting: Allergy

## 2018-06-05 VITALS — BP 150/100 | HR 96 | Ht 62.0 in | Wt 132.0 lb

## 2018-06-05 DIAGNOSIS — R05 Cough: Secondary | ICD-10-CM

## 2018-06-05 DIAGNOSIS — J3089 Other allergic rhinitis: Secondary | ICD-10-CM

## 2018-06-05 DIAGNOSIS — R053 Chronic cough: Secondary | ICD-10-CM

## 2018-06-05 MED ORDER — AZELASTINE HCL 0.1 % NA SOLN: 2.0000 | Freq: Two times a day (BID) | NASAL | 5 refills | Status: DC

## 2018-06-05 MED FILL — MONTELUKAST SOD 10 MG TAB: 10 | 30 days supply | Qty: 30 | Fill #1

## 2018-06-05 MED FILL — AZELASTINE HCL 137 MCG SPRY: 0.1 | 30 days supply | Qty: 30 | Fill #0

## 2018-06-05 NOTE — Patient Instructions (Addendum)
Skin testing showed sensitivity to mold and cat Food testing was negative  Coughing: . Rescue medications: May use albuterol rescue inhaler 2 puffs or nebulizer every 4 to 6 hours as needed for shortness of breath, chest tightness, coughing, and wheezing. Monitor frequency of use.   Continue Singulair 10mg  daily  Will trial nasal Astelin 2 sprays each nostril twice a day to determine if there may be a component of silent post-nasal drainage leading to chronic cough  Follow up in 8 weeks or sooner if needed

## 2018-06-05 NOTE — Progress Notes (Signed)
Follow-up Note  RE: Karen Cameron MRN: [id number] DOB: 05-14-1965 Date of Office Visit: 06/05/2018   History of present illness: Karen Cameron is a 53 y.o. female presenting today for follow-up of chronic cough as well as allergic rhinitis.  She was last seen in the office on May 08, 2018 by Dr. Selena BattenKim.  At that point Dr. Selena BattenKim had her start Singulair.  She states she does not really tell much of a difference with her cough on Singulair.  She states that she is doing albuterol in the morning every day but states that she normally coughs while using the albuterol and does not know if that is helping either.  She does states she feels like something is just stuck in her throat but cannot really clear.  Upon questioning she states that she has not tried use of a nasal antihistamine at this point.  In regards to her allergies she states that she does have some occasional sneezing.   Review of systems: Review of Systems  Constitutional: Negative for chills, fever and malaise/fatigue.  HENT: Negative for congestion, ear discharge, nosebleeds and sinus pain.   Eyes: Negative for discharge and redness.  Respiratory: Positive for cough. Negative for shortness of breath and wheezing.   Cardiovascular: Negative for chest pain.  Gastrointestinal: Negative for abdominal pain, constipation, diarrhea, heartburn, nausea and vomiting.  Musculoskeletal: Negative for joint pain.  Skin: Negative for itching and rash.  Neurological: Negative for headaches.    All other systems negative unless noted above in HPI  Past medical/social/surgical/family history have been reviewed and are unchanged unless specifically indicated below.  No changes  Medication List: Allergies as of 06/05/2018      Reactions   Other       Medication List       Accurate as of June 05, 2018 11:59 PM. If you have any questions, ask your nurse or doctor.        albuterol 108 (90 Base) MCG/ACT inhaler Commonly known as:   VENTOLIN HFA Inhale 2 puffs into the lungs every 4 (four) hours as needed for wheezing or shortness of breath.   azelastine 0.1 % nasal spray Commonly known as:  ASTELIN Place 2 sprays into both nostrils 2 (two) times daily. Started by:  Shaylar Larose HiresPatricia Padgett, MD   hydrochlorothiazide 12.5 MG capsule Commonly known as:  MICROZIDE Take 1 capsule (12.5 mg total) by mouth daily.   levocetirizine 5 MG tablet Commonly known as:  Xyzal Take 1 tablet (5 mg total) by mouth every evening.   montelukast 10 MG tablet Commonly known as:  Singulair Take 1 tablet (10 mg total) by mouth at bedtime.       Known medication allergies: Allergies  Allergen Reactions  . Other      Physical examination: Blood pressure (!) 150/100, pulse 96, height 5\' 2"  (1.575 m), weight 132 lb (59.9 kg), SpO2 98 %.  General: Alert, interactive, in no acute distress. HEENT: PERRLA, TMs pearly gray, turbinates moderately edematous with clear discharge, post-pharynx non erythematous. Neck: Supple without lymphadenopathy. Lungs: Clear to auscultation without wheezing, rhonchi or rales. {no increased work of breathing. CV: Normal S1, S2 without murmurs. Abdomen: Nondistended, nontender. Skin: Warm and dry, without lesions or rashes. Extremities:  No clubbing, cyanosis or edema. Neuro:   Grossly intact.  Diagnositics/Labs: None today  Assessment and plan: Chronic Cough Allergic rhinitis   - Skin testing showed sensitivity to mold and cat - Food testing was negative  -  Rescue medications: May use albuterol rescue inhaler 2 puffs or nebulizer every 4 to 6 hours as needed for shortness of breath, chest tightness, coughing, and wheezing. Monitor frequency of use.   - Continue Singulair 10mg  daily  - Will trial nasal Astelin 2 sprays each nostril twice a day to determine if there may be a component of silent post-nasal drainage leading to chronic cough  Follow up in 8 weeks or sooner if needed  I  appreciate the opportunity to take part in Angelo's care. Please do not hesitate to contact me with questions.  Sincerely,   Margo Aye, MD Allergy/Immunology [facility]

## 2018-07-24 MED FILL — LEVOCETIRIZINE 5 MG TABLET: 5 | 30 days supply | Qty: 30 | Fill #0

## 2018-07-24 MED FILL — MONTELUKAST SOD 10 MG TAB: 10 | 30 days supply | Qty: 30 | Fill #0

## 2018-07-31 ENCOUNTER — Ambulatory Visit: Payer: 59 | Admitting: Allergy

## 2018-08-08 ENCOUNTER — Other Ambulatory Visit: Payer: Self-pay | Admitting: Family Medicine

## 2018-08-08 NOTE — Telephone Encounter (Signed)
Requested medication (s) are due for refill today: yes  Requested medication (s) are on the active medication list: yes  Last refill: 04/23/2018  Future visit scheduled: no  Notes to clinic:  Patient needs office visit.    Requested Prescriptions  Pending Prescriptions Disp Refills   hydrochlorothiazide (MICROZIDE) 12.5 MG capsule [Pharmacy Med Name: HYDROCHLOROTHIAZIDE 12.5 MG 12.5 CAP] 90 capsule 0    Sig: TAKE 1 CAPSULE BY MOUTH ONCE A DAY     Cardiovascular: Diuretics - Thiazide Failed - 08/08/2018 10:08 AM      Failed - Last BP in normal range    BP Readings from Last 1 Encounters:  06/05/18 (!) 150/100         Failed - Valid encounter within last 6 months    Recent Outpatient Visits          3 months ago Encounter for health maintenance examination in adult   [facility], Arlie Solomons, MD   1 year ago Prehypertension   [facility] at [place], MD             [place] in normal range and within 360 days    Calcium  Date Value Ref Range Status  04/22/2018 9.2 8.7 - 10.2 mg/dL Final         Passed - Cr in normal range and within 360 days    Creatinine, Ser  Date Value Ref Range Status  04/22/2018 0.92 0.57 - 1.00 mg/dL Final         Passed - K in normal range and within 360 days    Potassium  Date Value Ref Range Status  04/22/2018 4.5 3.5 - 5.2 mmol/L Final         Passed - Na in normal range and within 360 days    Sodium  Date Value Ref Range Status  04/22/2018 138 134 - 144 mmol/L Final

## 2018-08-09 MED FILL — HYDROCHLOROTHIAZIDE 12.5 MG: 12.5 | 90 days supply | Qty: 90 | Fill #0

## 2018-10-22 MED FILL — LEVOCETIRIZINE 5 MG TABLET: 5 | 30 days supply | Qty: 30 | Fill #1

## 2018-10-22 MED FILL — MONTELUKAST SOD 10 MG TAB: 10 | 30 days supply | Qty: 30 | Fill #1

## 2018-12-03 ENCOUNTER — Other Ambulatory Visit: Payer: Self-pay | Admitting: Allergy

## 2018-12-03 MED FILL — MONTELUKAST SOD 10 MG TAB: 10 | 30 days supply | Qty: 30 | Fill #0

## 2018-12-03 MED FILL — LEVOCETIRIZINE 5 MG TABLET: 5 | 30 days supply | Qty: 30 | Fill #2

## 2018-12-13 MED FILL — LEVOCETIRIZINE 5 MG TABLET: 5 | 30 days supply | Qty: 30 | Fill #2

## 2018-12-13 MED FILL — MONTELUKAST SOD 10 MG TAB: 10 | 30 days supply | Qty: 30 | Fill #0

## 2019-02-17 ENCOUNTER — Other Ambulatory Visit: Payer: Self-pay | Admitting: Family Medicine

## 2019-02-17 MED FILL — HYDROCHLOROTHIAZIDE 12.5 MG: 12.5 | 30 days supply | Qty: 30 | Fill #0

## 2019-02-17 NOTE — Telephone Encounter (Signed)
Attempted to reach pt. To schedule an OV. Message sent to pharmacy to notify pt.

## 2019-03-03 DIAGNOSIS — Z6824 Body mass index (BMI) 24.0-24.9, adult: Secondary | ICD-10-CM | POA: Diagnosis not present

## 2019-03-03 DIAGNOSIS — Z1211 Encounter for screening for malignant neoplasm of colon: Secondary | ICD-10-CM | POA: Diagnosis not present

## 2019-03-03 DIAGNOSIS — N951 Menopausal and female climacteric states: Secondary | ICD-10-CM | POA: Diagnosis not present

## 2019-03-03 DIAGNOSIS — Z01419 Encounter for gynecological examination (general) (routine) without abnormal findings: Secondary | ICD-10-CM | POA: Diagnosis not present

## 2019-03-03 DIAGNOSIS — Z1231 Encounter for screening mammogram for malignant neoplasm of breast: Secondary | ICD-10-CM | POA: Diagnosis not present

## 2019-03-31 DIAGNOSIS — R928 Other abnormal and inconclusive findings on diagnostic imaging of breast: Secondary | ICD-10-CM | POA: Diagnosis not present

## 2019-04-02 ENCOUNTER — Telehealth (INDEPENDENT_AMBULATORY_CARE_PROVIDER_SITE_OTHER): Payer: 59 | Admitting: Family Medicine

## 2019-04-02 ENCOUNTER — Encounter: Payer: Self-pay | Admitting: Family Medicine

## 2019-04-02 ENCOUNTER — Other Ambulatory Visit: Payer: Self-pay

## 2019-04-02 VITALS — BP 126/76 | Ht 62.0 in | Wt 131.0 lb

## 2019-04-02 DIAGNOSIS — I10 Essential (primary) hypertension: Secondary | ICD-10-CM

## 2019-04-02 MED ORDER — HYDROCHLOROTHIAZIDE 12.5 MG PO CAPS: 12.5000 mg | ORAL_CAPSULE | Freq: Every day | ORAL | 3 refills | Status: DC

## 2019-04-02 NOTE — Progress Notes (Signed)
DOXIMITY VIDEO Encounter- SOAP NOTE Established Patient   This telephone encounter was conducted with the patient's (or proxy's) verbal consent via audio telecommunications: yes/no: Yes Patient was instructed to have this encounter in a suitably private space; and to only have persons present to whom they give permission to participate. In addition, patient identity was confirmed by use of name plus two identifiers (DOB and address).  I discussed the limitations, risks, security and privacy concerns of performing an evaluation and management service by telephone and the availability of in person appointments. I also discussed with the patient that there may be a patient responsible charge related to this service. The patient expressed understanding and agreed to proceed.  I spent a total of TIME; 0 MIN TO 60 MIN: 15 minutes talking with the patient or their proxy.        Established Patient Office Visit  Subjective:  Patient ID: Karen Cameron, female    DOB: 04-29-1965  Age: 54 y.o. MRN: [id number]  CC:  Chief Complaint  Patient presents with  . Medication Refill    Hctz    HPI Karen Cameron presents for   Hypertension: Patient here for follow-up of elevated blood pressure. She is exercising and is adherent to low salt diet.  Blood pressure is well controlled at home. Cardiac symptoms none. Patient denies chest pressure/discomfort, dyspnea, exertional chest pressure/discomfort, irregular heart beat and lower extremity edema.  Cardiovascular risk factors: hypertension. Use of agents associated with hypertension: none. History of target organ damage: none. BP Readings from Last 3 Encounters:  04/02/19 126/76  06/05/18 (!) 150/100  05/08/18 122/87     Past Medical History:  Diagnosis Date  . Nausea & vomiting   . SAB (spontaneous abortion)    X2    Past Surgical History:  Procedure Laterality Date  . CHOLECYSTECTOMY      Family History  Problem Relation Age  of Onset  . Diabetes Mother   . Cancer Father        HEAD,NECK, THROAT  . Diabetes Maternal Aunt   . Diabetes Maternal Uncle   . Diabetes Paternal Grandmother   . Allergic rhinitis Neg Hx   . Asthma Neg Hx   . Eczema Neg Hx   . Urticaria Neg Hx     Social History   Socioeconomic History  . Marital status: Married    Spouse name: Not on file  . Number of children: Not on file  . Years of education: Not on file  . Highest education level: Not on file  Occupational History  . Not on file  Tobacco Use  . Smoking status: Never Smoker  . Smokeless tobacco: Never Used  Substance and Sexual Activity  . Alcohol use: Yes  . Drug use: No  . Sexual activity: Yes    Birth control/protection: Surgical    Comment: BTL  Other Topics Concern  . Not on file  Social History Narrative  . Not on file   Social Determinants of Health   Financial Resource Strain:   . Difficulty of Paying Living Expenses:   Food Insecurity:   . Worried About Charity fundraiser in the Last Year:   . Arboriculturist in the Last Year:   Transportation Needs:   . Film/video editor (Medical):   Marland Kitchen Lack of Transportation (Non-Medical):   Physical Activity:   . Days of Exercise per Week:   . Minutes of Exercise per Session:  Stress:   . Feeling of Stress :   Social Connections:   . Frequency of Communication with Friends and Family:   . Frequency of Social Gatherings with Friends and Family:   . Attends Religious Services:   . Active Member of Clubs or Organizations:   . Attends Banker Meetings:   Marland Kitchen Marital Status:   Intimate Partner Violence:   . Fear of Current or Ex-Partner:   . Emotionally Abused:   Marland Kitchen Physically Abused:   . Sexually Abused:     Outpatient Medications Prior to Visit  Medication Sig Dispense Refill  . levocetirizine (XYZAL) 5 MG tablet Take 1 tablet (5 mg total) by mouth every evening. 30 tablet 6  . montelukast (SINGULAIR) 10 MG tablet TAKE 1 TABLET (10 MG  TOTAL) BY MOUTH AT BEDTIME. 30 tablet 0  . hydrochlorothiazide (MICROZIDE) 12.5 MG capsule TAKE 1 CAPSULE BY MOUTH ONCE A DAY 30 capsule 0  . albuterol (VENTOLIN HFA) 108 (90 Base) MCG/ACT inhaler Inhale 2 puffs into the lungs every 4 (four) hours as needed for wheezing or shortness of breath. (Patient not taking: Reported on 04/02/2019) 1 Inhaler 2  . azelastine (ASTELIN) 0.1 % nasal spray Place 2 sprays into both nostrils 2 (two) times daily. (Patient not taking: Reported on 04/02/2019) 30 mL 5   No facility-administered medications prior to visit.    Allergies  Allergen Reactions  . Other     ROS Review of Systems    Objective:    Physical Exam  BP 126/76 Comment: per pt 03/25/2019  Ht 5\' 2"  (1.575 m) Comment: per pt  Wt 131 lb (59.4 kg) Comment: per pt  BMI 23.96 kg/m  Wt Readings from Last 3 Encounters:  04/02/19 131 lb (59.4 kg)  06/05/18 132 lb (59.9 kg)  05/08/18 134 lb (60.8 kg)   Physical Exam  Constitutional: Oriented to person, place, and time. Appears well-developed and well-nourished.  HENT:  Head: Normocephalic and atraumatic.  Eyes: Conjunctivae and EOM are normal. . Pulmonary/Chest: Effort normal Skin: Skin is warm. Capillary refill takes less than 2 seconds.  Psychiatric: Has a normal mood and affect. Behavior is normal. Judgment and thought content normal.    Health Maintenance Due  Topic Date Due  . PAP SMEAR-Modifier  12/13/2014    There are no preventive care reminders to display for this patient.  Lab Results  Component Value Date   TSH 1.190 04/22/2018   Lab Results  Component Value Date   WBC 5.5 04/22/2018   HGB 13.0 04/22/2018   HCT 39.9 04/22/2018   MCV 87 04/22/2018   PLT 328 04/22/2018   Lab Results  Component Value Date   NA 138 04/22/2018   K 4.5 04/22/2018   CO2 21 04/22/2018   GLUCOSE 74 04/22/2018   BUN 9 04/22/2018   CREATININE 0.92 04/22/2018   BILITOT 0.5 04/22/2018   ALKPHOS 85 04/22/2018   AST 22 04/22/2018    ALT 17 04/22/2018   PROT 7.2 04/22/2018   ALBUMIN 4.6 04/22/2018   CALCIUM 9.2 04/22/2018   Lab Results  Component Value Date   CHOL 189 04/22/2018   Lab Results  Component Value Date   HDL 74 04/22/2018   Lab Results  Component Value Date   LDLCALC 106 (H) 04/22/2018   Lab Results  Component Value Date   TRIG 45 04/22/2018   Lab Results  Component Value Date   CHOLHDL 2.6 04/22/2018   No results found for: HGBA1C  Assessment & Plan:   Problem List Items Addressed This Visit    None    Visit Diagnoses    Essential hypertension    -  Primary   Relevant Medications   hydrochlorothiazide (MICROZIDE) 12.5 MG capsule   Other Relevant Orders   Comprehensive metabolic panel     - Patient's blood pressure is at goal of 139/89 or less. Condition is stable. Continue current medications and treatment plan. I recommend that you exercise for 30-45 minutes 5 days a week. I also recommend a balanced diet with fruits and vegetables every day, lean meats, and little fried foods. The DASH diet (you can find this online) is a good example of this.   Meds ordered this encounter  Medications  . hydrochlorothiazide (MICROZIDE) 12.5 MG capsule    Sig: Take 1 capsule (12.5 mg total) by mouth daily.    Dispense:  90 capsule    Refill:  3   I discussed the assessment and treatment plan with the patient. The patient was provided an opportunity to ask questions and all were answered. The patient agreed with the plan and demonstrated an understanding of the instructions.   The patient was advised to call back or seek an in-person evaluation if the symptoms worsen or if the condition fails to improve as anticipated.  I provided of DOXIMITY VIDEO face-to-face time during this encounter.  Doristine Bosworth, MD  Primary Care at [facility]   Follow-up: No follow-ups on file.    Doristine Bosworth, MD

## 2019-04-02 NOTE — Patient Instructions (Signed)
° ° ° °  If you have lab work done today you will be contacted with your lab results within the next 2 weeks.  If you have not heard from us then please contact us. The fastest way to get your results is to register for My Chart. ° ° °IF you received an x-ray today, you will receive an invoice from [facility]. Please contact [facility] at [phone] with questions or concerns regarding your invoice.  ° °IF you received labwork today, you will receive an invoice from [facility]. Please contact [facility] at [phone] with questions or concerns regarding your invoice.  ° °Our billing staff will not be able to assist you with questions regarding bills from these companies. ° °You will be contacted with the lab results as soon as they are available. The fastest way to get your results is to activate your My Chart account. Instructions are located on the last page of this paperwork. If you have not heard from us regarding the results in 2 weeks, please contact this office. °  ° ° ° °

## 2019-04-14 MED FILL — LEVOCETIRIZINE 5 MG TABLET: 5 | 30 days supply | Qty: 30 | Fill #3

## 2019-04-14 MED FILL — HYDROCHLOROTHIAZIDE 12.5 MG: 12.5 | 90 days supply | Qty: 90 | Fill #0

## 2019-06-10 DIAGNOSIS — N393 Stress incontinence (female) (male): Secondary | ICD-10-CM | POA: Diagnosis not present

## 2019-06-30 MED FILL — LEVOCETIRIZINE 5 MG TABLET: 5 | 90 days supply | Qty: 90 | Fill #0

## 2020-03-02 DIAGNOSIS — H524 Presbyopia: Secondary | ICD-10-CM | POA: Diagnosis not present

## 2020-03-12 DIAGNOSIS — Z01419 Encounter for gynecological examination (general) (routine) without abnormal findings: Secondary | ICD-10-CM | POA: Diagnosis not present

## 2020-03-12 DIAGNOSIS — Z1211 Encounter for screening for malignant neoplasm of colon: Secondary | ICD-10-CM | POA: Diagnosis not present

## 2020-03-12 DIAGNOSIS — Z1231 Encounter for screening mammogram for malignant neoplasm of breast: Secondary | ICD-10-CM | POA: Diagnosis not present

## 2020-03-12 DIAGNOSIS — Z6824 Body mass index (BMI) 24.0-24.9, adult: Secondary | ICD-10-CM | POA: Diagnosis not present

## 2020-03-12 LAB — HM MAMMOGRAPHY

## 2020-03-24 DIAGNOSIS — Z1211 Encounter for screening for malignant neoplasm of colon: Secondary | ICD-10-CM | POA: Diagnosis not present

## 2020-03-24 LAB — COLOGUARD: Cologuard: NEGATIVE

## 2020-03-31 LAB — COLOGUARD: COLOGUARD: NEGATIVE

## 2020-04-15 DIAGNOSIS — Z20822 Contact with and (suspected) exposure to covid-19: Secondary | ICD-10-CM | POA: Diagnosis not present

## 2020-06-03 DIAGNOSIS — Z20822 Contact with and (suspected) exposure to covid-19: Secondary | ICD-10-CM | POA: Diagnosis not present

## 2020-06-03 DIAGNOSIS — Z03818 Encounter for observation for suspected exposure to other biological agents ruled out: Secondary | ICD-10-CM | POA: Diagnosis not present

## 2020-06-29 DIAGNOSIS — Z03818 Encounter for observation for suspected exposure to other biological agents ruled out: Secondary | ICD-10-CM | POA: Diagnosis not present

## 2020-06-29 DIAGNOSIS — Z20822 Contact with and (suspected) exposure to covid-19: Secondary | ICD-10-CM | POA: Diagnosis not present

## 2020-07-16 ENCOUNTER — Other Ambulatory Visit (HOSPITAL_COMMUNITY): Payer: Self-pay

## 2020-07-16 MED ORDER — CARESTART COVID-19 HOME TEST VI KIT
PACK | 0 refills | Status: DC
  Filled 2020-07-16: qty 4, 4d supply, fill #0

## 2020-12-13 ENCOUNTER — Encounter: Payer: Self-pay | Admitting: Nurse Practitioner

## 2020-12-13 ENCOUNTER — Other Ambulatory Visit: Payer: Self-pay

## 2020-12-13 ENCOUNTER — Ambulatory Visit: Payer: 59 | Admitting: Nurse Practitioner

## 2020-12-13 VITALS — BP 138/82 | HR 91 | Temp 97.9°F | Ht 61.2 in | Wt 134.6 lb

## 2020-12-13 DIAGNOSIS — I10 Essential (primary) hypertension: Secondary | ICD-10-CM | POA: Diagnosis not present

## 2020-12-13 DIAGNOSIS — Z0000 Encounter for general adult medical examination without abnormal findings: Secondary | ICD-10-CM

## 2020-12-13 DIAGNOSIS — R053 Chronic cough: Secondary | ICD-10-CM

## 2020-12-13 NOTE — Progress Notes (Signed)
I,Tianna Badgett,acting as a Neurosurgeon for [employer], NP.,have documented all relevant documentation on the behalf of [employer], NP,as directed by  Charlesetta Ivory, NP while in the presence of Charlesetta Ivory, NP.  This visit occurred during the SARS-CoV-2 public health emergency.  Safety protocols were in place, including screening questions prior to the visit, additional usage of staff PPE, and extensive cleaning of exam room while observing appropriate contact time as indicated for disinfecting solutions.  Subjective:     Patient ID: Karen Cameron , female    DOB: 15-Jan-1965 , 55 y.o.   MRN: [id number]   Chief Complaint  Patient presents with   Establish Care    HPI  Patient is here to establish.  She was seeing Collie Siad. She has not had a physical exam this year. She has a history of hypertension for which she used to take medicine but she is not taking any medication currently. She does take a probiotic mixed supplement.   She does have a [place] at [place].  Drink or smoke: she does not smoke but occasionally drinks.     Past Medical History:  Diagnosis Date   Nausea & vomiting    SAB (spontaneous abortion)    X2     Family History  Problem Relation Age of Onset   Diabetes Mother    Cancer Father        HEAD,NECK, THROAT   Diabetes Maternal Aunt    Diabetes Maternal Uncle    Diabetes Paternal Grandmother    Allergic rhinitis Neg Hx    Asthma Neg Hx    Eczema Neg Hx    Urticaria Neg Hx      Current Outpatient Medications:    levocetirizine (XYZAL) 5 MG tablet, Take 1 tablet (5 mg total) by mouth every evening., Disp: 30 tablet, Rfl: 6   montelukast (SINGULAIR) 10 MG tablet, TAKE 1 TABLET (10 MG TOTAL) BY MOUTH AT BEDTIME., Disp: 30 tablet, Rfl: 0   Allergies  Allergen Reactions   Cat Hair Extract    Dust Mite Extract    Other      Review of Systems  Constitutional: Negative.  Negative for chills and fever.  HENT:  Negative  for congestion and sore throat.   Respiratory: Negative.  Negative for cough, shortness of breath and wheezing.   Cardiovascular: Negative.  Negative for chest pain and palpitations.  Gastrointestinal: Negative.   Endocrine: Negative for polydipsia, polyphagia and polyuria.  Musculoskeletal:  Negative for arthralgias and myalgias.  Neurological: Negative.  Negative for weakness and headaches.    Today's Vitals   12/13/20 1431  BP: 138/82  Pulse: 91  Temp: 97.9 F (36.6 C)  TempSrc: Oral  Weight: 134 lb 9.6 oz (61.1 kg)  Height: 5' 1.2" (1.554 m)   Body mass index is 25.27 kg/m.  Wt Readings from Last 3 Encounters:  12/13/20 134 lb 9.6 oz (61.1 kg)  04/02/19 131 lb (59.4 kg)  06/05/18 132 lb (59.9 kg)    Objective:  Physical Exam Constitutional:      Appearance: Normal appearance.  HENT:     Head: Normocephalic and atraumatic.  Cardiovascular:     Rate and Rhythm: Normal rate and regular rhythm.     Pulses: Normal pulses.     Heart sounds: Normal heart sounds. No murmur heard. Pulmonary:     Effort: Pulmonary effort is normal. No respiratory distress.     Breath sounds: Normal breath sounds. No wheezing.  Skin:  General: Skin is warm and dry.     Capillary Refill: Capillary refill takes less than 2 seconds.  Neurological:     Mental Status: She is alert.        Assessment And Plan:     1. Encounter for medical examination to establish care --Patient is here to establish care. Micah Flesher over patient medical, family, social and surgical history. -Reviewed with patient their medications and any allergies  -Reviewed with patient their sexual orientation, drug/tobacco and alcohol use -Dicussed any new concerns with patient  -recommended patient comes in for a physical exam and complete blood work.  -Educated patient about the importance of annual screenings and immunizations.  -Advised patient to eat a healthy diet along with exercise for atleast 30-45 min atleast 4-5  days of the week.   2. Essential hypertension  -She was diagnosed with HTN previously and was taking med. (HCTZ) she has not taken med in a while.  -She would like to work on her lifestyle modification including diet and exercise -Advised patient to keep a log of her BP at home and bring it with her at her next visit.  -Educated the patient about the importance of BP control.  -Limit the intake of processed foods and salt intake. You should increase your intake of green vegetables and fruits. Limit the use of alcohol. Limit fast foods and fried foods. Avoid high fatty saturated and trans fat foods. Keep yourself hydrated with drinking water. Avoid red meats. Eat lean meats instead. Exercise for atleast 30-45 min for atleast 4-5 times a week.  -Will get labs at her next visit in 1 month   3. Chronic cough -Pt. Suffers from chronic cough for many years. She has seen ENT, GI and allergist specialists. Takes xyzal and singular.    The patient was encouraged to call or send a message through MyChart for any questions or concerns.   Follow up: 1 month   Side effects and appropriate use of all the medication(s) were discussed with the patient today. Patient advised to use the medication(s) as directed by their healthcare provider. The patient was encouraged to read, review, and understand all associated package inserts and contact our office with any questions or concerns. The patient accepts the risks of the treatment plan and had an opportunity to ask questions.   Staying healthy and adopting a healthy lifestyle for your overall health is important. You should eat 7 or more servings of fruits and vegetables per day. You should drink plenty of water to keep yourself hydrated and your kidneys healthy. This includes about 65-80+ fluid ounces of water. Limit your intake of animal fats especially for elevated cholesterol. Avoid highly processed food and limit your salt intake if you have hypertension.  Avoid foods high in saturated/Trans fats. Along with a healthy diet it is also very important to maintain time for yourself to maintain a healthy mental health with low stress levels. You should get atleast 150 min of moderate intensity exercise weekly for a healthy heart. Along with eating right and exercising, aim for at least 7-9 hours of sleep daily.  Eat more whole grains which includes barley, wheat berries, oats, brown rice and whole wheat pasta. Use healthy plant oils which include olive, soy, corn, sunflower and peanut. Limit your caffeine and sugary drinks. Limit your intake of fast foods. Limit milk and dairy products to one or two daily servings.   Patient was given opportunity to ask questions. Patient verbalized understanding of the  plan and was able to repeat key elements of the plan. All questions were answered to their satisfaction.  Raman Rosenda Geffrard, DNP   I, Raman Marelly Wehrman have reviewed all documentation for this visit. The documentation on 12/13/20 for the exam, diagnosis, procedures, and orders are all accurate and complete.      IF YOU HAVE BEEN REFERRED TO A SPECIALIST, IT MAY TAKE 1-2 WEEKS TO SCHEDULE/PROCESS THE REFERRAL. IF YOU HAVE NOT HEARD FROM US/SPECIALIST IN TWO WEEKS, PLEASE GIVE [place] A CALL AT [phone] X [phone].   THE PATIENT IS ENCOURAGED TO PRACTICE SOCIAL DISTANCING DUE TO THE COVID-19 PANDEMIC.

## 2020-12-13 NOTE — Patient Instructions (Signed)

## 2020-12-16 ENCOUNTER — Ambulatory Visit: Payer: 59 | Admitting: Nurse Practitioner

## 2021-01-26 ENCOUNTER — Telehealth: Payer: 59 | Admitting: Family

## 2021-01-26 ENCOUNTER — Other Ambulatory Visit (HOSPITAL_COMMUNITY): Payer: Self-pay

## 2021-01-26 DIAGNOSIS — J301 Allergic rhinitis due to pollen: Secondary | ICD-10-CM | POA: Diagnosis not present

## 2021-01-26 DIAGNOSIS — H8110 Benign paroxysmal vertigo, unspecified ear: Secondary | ICD-10-CM

## 2021-01-26 MED ORDER — FLUTICASONE PROPIONATE 50 MCG/ACT NA SUSP
2.0000 | Freq: Every day | NASAL | 6 refills | Status: DC
  Filled 2021-01-26: qty 16, 30d supply, fill #0

## 2021-01-26 MED ORDER — ONDANSETRON HCL 4 MG PO TABS
4.0000 mg | ORAL_TABLET | Freq: Three times a day (TID) | ORAL | 0 refills | Status: DC | PRN
  Filled 2021-01-26: qty 20, 7d supply, fill #0

## 2021-01-26 MED ORDER — LEVOCETIRIZINE DIHYDROCHLORIDE 5 MG PO TABS
5.0000 mg | ORAL_TABLET | Freq: Every evening | ORAL | 2 refills | Status: DC
  Filled 2021-01-26: qty 90, 90d supply, fill #0
  Filled 2021-06-14: qty 90, 90d supply, fill #1
  Filled 2021-10-07: qty 90, 90d supply, fill #2

## 2021-01-26 MED ORDER — MECLIZINE HCL 25 MG PO TABS
25.0000 mg | ORAL_TABLET | Freq: Three times a day (TID) | ORAL | 1 refills | Status: DC | PRN
  Filled 2021-01-26: qty 90, 30d supply, fill #0
  Filled 2021-10-07: qty 90, 30d supply, fill #1

## 2021-01-26 NOTE — Patient Instructions (Signed)
Vertigo Vertigo is the feeling that you or your surroundings are moving when they are not. This feeling can come and go at any time. Vertigo often goes away on its own. Vertigo can be dangerous if it occurs while you are doing something thatcould endanger yourself or others, such as driving or operating machinery. Your health care provider will do tests to try to determine the cause of your vertigo. Tests will also help your health care provider decide how best totreat your condition. Follow these instructions at home: Eating and drinking     Dehydration can make vertigo worse. Drink enough fluid to keep your urine pale yellow. Do not drink alcohol. Activity Return to your normal activities as told by your health care provider. Ask your health care provider what activities are safe for you. In the morning, first sit up on the side of the bed. When you feel okay, stand slowly while you hold onto something until you know that your balance is fine. Move slowly. Avoid sudden body or head movements or certain positions, as told by your health care provider. If you have trouble walking or keeping your balance, try using a cane for stability. If you feel dizzy or unstable, sit down right away. Avoid doing any tasks that would cause danger to you or others if vertigo occurs. Avoid bending down if you feel dizzy. Place items in your home so that they are easy for you to reach without bending or leaning over. Do not drive or use machinery if you feel dizzy. General instructions Take over-the-counter and prescription medicines only as told by your health care provider. Keep all follow-up visits. This is important. Contact a health care provider if: Your medicines do not relieve your vertigo or they make it worse. Your condition gets worse or you develop new symptoms. You have a fever. You develop nausea or vomiting, or if nausea gets worse. Your family or friends notice any behavioral changes. You  have numbness or a prickling and tingling sensation in part of your body. Get help right away if you: Are always dizzy or you faint. Develop severe headaches. Develop a stiff neck. Develop sensitivity to light. Have difficulty moving or speaking. Have weakness in your hands, arms, or legs. Have changes in your hearing or vision. These symptoms may represent a serious problem that is an emergency. Do not wait to see if the symptoms will go away. Get medical help right away. Call your local emergency services (911 in the [place] Do not drive yourself to the hospital. Summary Vertigo is the feeling that you or your surroundings are moving when they are not. Your health care provider will do tests to try to determine the cause of your vertigo. Follow instructions for home care. You may be told to avoid certain tasks, positions, or movements. Contact a health care provider if your medicines do not relieve your symptoms, or if you have a fever, nausea, vomiting, or changes in behavior. Get help right away if you have severe headaches or difficulty speaking, or you develop hearing or vision problems. This information is not intended to replace advice given to you by your health care provider. Make sure you discuss any questions you have with your healthcare provider. Document Revised: 11/19/2019 Document Reviewed: 11/19/2019 Elsevier Patient Education  2022 [employer]  

## 2021-01-26 NOTE — Progress Notes (Signed)
Virtual Visit Consent   Karen Cameron, you are scheduled for a virtual visit with a  provider today.     Just as with appointments in the office, your consent must be obtained to participate.  Your consent will be active for this visit and any virtual visit you may have with one of our providers in the next 365 days.     If you have a MyChart account, a copy of this consent can be sent to you electronically.  All virtual visits are billed to your insurance company just like a traditional visit in the office.    As this is a virtual visit, video technology does not allow for your provider to perform a traditional examination.  This may limit your provider's ability to fully assess your condition.  If your provider identifies any concerns that need to be evaluated in person or the need to arrange testing (such as labs, EKG, etc.), we will make arrangements to do so.     Although advances in technology are sophisticated, we cannot ensure that it will always work on either your end or our end.  If the connection with a video visit is poor, the visit may have to be switched to a telephone visit.  With either a video or telephone visit, we are not always able to ensure that we have a secure connection.     I need to obtain your verbal consent now.   Are you willing to proceed with your visit today?    Karen Cameron has provided verbal consent on 01/26/2021 for a virtual visit (video or telephone).   Karen Rodney, FNP   Date: 01/26/2021 12:29 PM   Virtual Visit via Video Note   I, Karen Cameron, connected with  Karen Cameron  ([id number], 1965/12/11) on 01/26/21 at 12:30 PM EST by a video-enabled telemedicine application and verified that I am speaking with the correct person using two identifiers.  Location: Patient: Virtual Visit Location Patient: Other: work Provider: Pharmacist, community: Home Office   I discussed the limitations of evaluation and management by  telemedicine and the availability of in person appointments. The patient expressed understanding and agreed to proceed.    History of Present Illness: Karen Cameron is a 56 y.o. who identifies as a female who was assigned female at birth, and is being seen today for dizzness.  HPI: Dizziness This is a new problem. The current episode started 1 to 4 weeks ago. The problem occurs intermittently. The problem has been waxing and waning. Associated symptoms include nausea, vertigo and vomiting. Pertinent negatives include no chest pain, chills, congestion or coughing. The symptoms are aggravated by twisting and bending. She has tried rest for the symptoms. The treatment provided mild relief.   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    Allergies:  Allergies  Allergen Reactions   Cat Hair Extract    Dust Mite Extract    Other    Medications:  Current Outpatient Medications:    fluticasone (FLONASE) 50 MCG/ACT nasal spray, Place 2 sprays into both nostrils daily., Disp: 16 g, Rfl: 6   meclizine (ANTIVERT) 25 MG tablet, Take 1 tablet (25 mg total) by mouth 3 (three) times daily as needed for dizziness., Disp: 90 tablet, Rfl: 1   ondansetron (ZOFRAN) 4 MG tablet, Take 1 tablet (4 mg total) by  mouth every 8 (eight) hours as needed for nausea or vomiting., Disp: 20 tablet, Rfl: 0   levocetirizine (XYZAL) 5 MG tablet, Take 1 tablet (5 mg total) by mouth every evening., Disp: 90 tablet, Rfl: 2   montelukast (SINGULAIR) 10 MG tablet, TAKE 1 TABLET (10 MG TOTAL) BY MOUTH AT BEDTIME., Disp: 30 tablet, Rfl: 0  Observations/Objective: Patient is well-developed, well-nourished in no acute distress.   Head is normocephalic, atraumatic.  No labored breathing.  Speech is clear and coherent with logical content.  Patient is alert and oriented at baseline.    Assessment and Plan: 1.  Benign paroxysmal positional vertigo, unspecified laterality - ondansetron (ZOFRAN) 4 MG tablet; Take 1 tablet (4 mg total) by mouth every 8 (eight) hours as needed for nausea or vomiting.  Dispense: 20 tablet; Refill: 0 - fluticasone (FLONASE) 50 MCG/ACT nasal spray; Place 2 sprays into both nostrils daily.  Dispense: 16 g; Refill: 6 - levocetirizine (XYZAL) 5 MG tablet; Take 1 tablet (5 mg total) by mouth every evening.  Dispense: 90 tablet; Refill: 2 - meclizine (ANTIVERT) 25 MG tablet; Take 1 tablet (25 mg total) by mouth 3 (three) times daily as needed for dizziness.  Dispense: 90 tablet; Refill: 1  2. Seasonal allergic rhinitis due to pollen - levocetirizine (XYZAL) 5 MG tablet; Take 1 tablet (5 mg total) by mouth every evening.  Dispense: 90 tablet; Refill: 2  Epley exercises discussed Avoid fast position changes Start Xyzal and flonase daily Force fluids Rest Follow up if symptoms worsen or do not improve  Follow Up Instructions: I discussed the assessment and treatment plan with the patient. The patient was provided an opportunity to ask questions and all were answered. The patient agreed with the plan and demonstrated an understanding of the instructions.  A copy of instructions were sent to the patient via MyChart unless otherwise noted below.     The patient was advised to call back or seek an in-person evaluation if the symptoms worsen or if the condition fails to improve as anticipated.  Time:  I spent 9 minutes with the patient via telehealth technology discussing the above problems/concerns.    Karen Rodney, FNP

## 2021-03-03 DIAGNOSIS — H524 Presbyopia: Secondary | ICD-10-CM | POA: Diagnosis not present

## 2021-03-14 DIAGNOSIS — Z01419 Encounter for gynecological examination (general) (routine) without abnormal findings: Secondary | ICD-10-CM | POA: Diagnosis not present

## 2021-03-14 DIAGNOSIS — Z1239 Encounter for other screening for malignant neoplasm of breast: Secondary | ICD-10-CM | POA: Diagnosis not present

## 2021-03-14 DIAGNOSIS — Z6824 Body mass index (BMI) 24.0-24.9, adult: Secondary | ICD-10-CM | POA: Diagnosis not present

## 2021-03-14 DIAGNOSIS — Z1211 Encounter for screening for malignant neoplasm of colon: Secondary | ICD-10-CM | POA: Diagnosis not present

## 2021-03-14 DIAGNOSIS — Z1231 Encounter for screening mammogram for malignant neoplasm of breast: Secondary | ICD-10-CM | POA: Diagnosis not present

## 2021-03-14 DIAGNOSIS — Z124 Encounter for screening for malignant neoplasm of cervix: Secondary | ICD-10-CM | POA: Diagnosis not present

## 2021-03-14 DIAGNOSIS — I10 Essential (primary) hypertension: Secondary | ICD-10-CM | POA: Diagnosis not present

## 2021-03-14 LAB — HM MAMMOGRAPHY

## 2021-03-14 LAB — HM PAP SMEAR

## 2021-05-05 ENCOUNTER — Ambulatory Visit (INDEPENDENT_AMBULATORY_CARE_PROVIDER_SITE_OTHER): Payer: 59 | Admitting: Nurse Practitioner

## 2021-05-05 ENCOUNTER — Encounter: Payer: Self-pay | Admitting: Nurse Practitioner

## 2021-05-05 ENCOUNTER — Other Ambulatory Visit (HOSPITAL_COMMUNITY): Payer: Self-pay

## 2021-05-05 ENCOUNTER — Other Ambulatory Visit: Payer: Self-pay

## 2021-05-05 VITALS — BP 148/86 | HR 88 | Temp 98.2°F | Ht 61.2 in | Wt 130.0 lb

## 2021-05-05 DIAGNOSIS — Z23 Encounter for immunization: Secondary | ICD-10-CM

## 2021-05-05 DIAGNOSIS — Z0000 Encounter for general adult medical examination without abnormal findings: Secondary | ICD-10-CM | POA: Diagnosis not present

## 2021-05-05 DIAGNOSIS — H6122 Impacted cerumen, left ear: Secondary | ICD-10-CM | POA: Diagnosis not present

## 2021-05-05 DIAGNOSIS — I10 Essential (primary) hypertension: Secondary | ICD-10-CM

## 2021-05-05 LAB — POCT URINALYSIS DIPSTICK
Bilirubin, UA: NEGATIVE
Blood, UA: NEGATIVE
Glucose, UA: NEGATIVE
Ketones, UA: NEGATIVE
Leukocytes, UA: NEGATIVE
Nitrite, UA: NEGATIVE
Protein, UA: NEGATIVE
Spec Grav, UA: 1.015 (ref 1.010–1.025)
Urobilinogen, UA: 0.2 E.U./dL
pH, UA: 7.5 (ref 5.0–8.0)

## 2021-05-05 MED ORDER — HYDROCHLOROTHIAZIDE 12.5 MG PO TABS
12.5000 mg | ORAL_TABLET | Freq: Every day | ORAL | 1 refills | Status: DC
  Filled 2021-05-05: qty 90, 90d supply, fill #0
  Filled 2021-10-07: qty 90, 90d supply, fill #1

## 2021-05-05 NOTE — Progress Notes (Signed)
I,Tianna Badgett,acting as a Education administrator for [employer], FNP.,have documented all relevant documentation on the behalf of Minette Brine, FNP,as directed by  Minette Brine, FNP while in the presence of Minette Brine, Derma.  This visit occurred during the SARS-CoV-2 public health emergency.  Safety protocols were in place, including screening questions prior to the visit, additional usage of staff PPE, and extensive cleaning of exam room while observing appropriate contact time as indicated for disinfecting solutions.  Subjective:     Patient ID: Karen Cameron , female    DOB: Oct 07, 1965 , 56 y.o.   MRN: [id number]   Chief Complaint  Patient presents with   Annual Exam    HPI  Patient presents for HM. Menstrual cycle is irregular. Followed by Earnstine Regal for her PAP.     Past Medical History:  Diagnosis Date   Nausea & vomiting    SAB (spontaneous abortion)    X2     Family History  Problem Relation Age of Onset   Diabetes Mother    Cancer Father        HEAD,NECK, THROAT   Diabetes Maternal Aunt    Diabetes Maternal Uncle    Diabetes Paternal Grandmother    Allergic rhinitis Neg Hx    Asthma Neg Hx    Eczema Neg Hx    Urticaria Neg Hx      Current Outpatient Medications:    fluticasone (FLONASE) 50 MCG/ACT nasal spray, Place 2 sprays into both nostrils daily., Disp: 16 g, Rfl: 6   hydrochlorothiazide (HYDRODIURIL) 12.5 MG tablet, Take 1 tablet (12.5 mg total) by mouth daily., Disp: 90 tablet, Rfl: 1   levocetirizine (XYZAL) 5 MG tablet, Take 1 tablet (5 mg total) by mouth every evening., Disp: 90 tablet, Rfl: 2   meclizine (ANTIVERT) 25 MG tablet, Take 1 tablet (25 mg total) by mouth 3 (three) times daily as needed for dizziness., Disp: 90 tablet, Rfl: 1   montelukast (SINGULAIR) 10 MG tablet, TAKE 1 TABLET (10 MG TOTAL) BY MOUTH AT BEDTIME., Disp: 30 tablet, Rfl: 0   ondansetron (ZOFRAN) 4 MG tablet, Take 1 tablet (4 mg total) by mouth every 8 (eight) hours as needed for  nausea or vomiting., Disp: 20 tablet, Rfl: 0   Allergies  Allergen Reactions   Cat Hair Extract    Dust Mite Extract    Other       The patient states she uses tubal ligation for birth control.  No LMP recorded. (Menstrual status: Irregular Periods).. Negative for Dysmenorrhea and Negative for Menorrhagia. Negative for: breast discharge, breast lump(s), breast pain and breast self exam. Associated symptoms include abnormal vaginal bleeding. Pertinent negatives include abnormal bleeding (hematology), anxiety, decreased libido, depression, difficulty falling sleep, dyspareunia, history of infertility, nocturia, sexual dysfunction, sleep disturbances, urinary incontinence, urinary urgency, vaginal discharge and vaginal itching. Diet: mostly low carbs.  The patient states her exercise level is none.   The patient's tobacco use is:  Social History   Tobacco Use  Smoking Status Never  Smokeless Tobacco Never   She has been exposed to passive smoke. The patient's alcohol use is:  Social History   Substance and Sexual Activity  Alcohol Use Yes  . Additional information: Last pap 03/14/21, next one scheduled for 03/14/2024.    Review of Systems  Constitutional: Negative.   HENT: Negative.    Eyes: Negative.   Respiratory: Negative.    Cardiovascular: Negative.   Gastrointestinal: Negative.   Endocrine: Negative.   Genitourinary: Negative.  Musculoskeletal: Negative.   Skin: Negative.   Allergic/Immunologic: Negative.   Neurological: Negative.   Hematological: Negative.   Psychiatric/Behavioral: Negative.      Today's Vitals   05/05/21 0958  BP: (!) 148/86  Pulse: 88  Temp: 98.2 F (36.8 C)  TempSrc: Oral  Weight: 130 lb (59 kg)  Height: 5' 1.2" (1.554 m)   Body mass index is 24.4 kg/m.   Objective:  Physical Exam Vitals reviewed.  Constitutional:      General: She is not in acute distress.    Appearance: Normal appearance. She is well-developed. She is obese.  HENT:      Head: Normocephalic and atraumatic.     Right Ear: Hearing, tympanic membrane, ear canal and external ear normal. There is no impacted cerumen.     Left Ear: Hearing and external ear normal. There is impacted cerumen.     Nose:     Comments: Deferred - masked    Mouth/Throat:     Comments: Deferred - masked Eyes:     General: Lids are normal.     Extraocular Movements: Extraocular movements intact.     Conjunctiva/sclera: Conjunctivae normal.     Pupils: Pupils are equal, round, and reactive to light.     Funduscopic exam:    Right eye: No papilledema.        Left eye: No papilledema.  Neck:     Thyroid: No thyroid mass.     Vascular: No carotid bruit.  Cardiovascular:     Rate and Rhythm: Normal rate and regular rhythm.     Pulses: Normal pulses.     Heart sounds: Normal heart sounds. No murmur heard. Pulmonary:     Effort: Pulmonary effort is normal.     Breath sounds: Normal breath sounds.  Chest:     Chest wall: No mass.  Breasts:    Tanner Score is 5.     Right: Normal. No mass or tenderness.     Left: Normal. No mass or tenderness.  Abdominal:     General: Abdomen is flat. Bowel sounds are normal. There is no distension.     Palpations: Abdomen is soft.     Tenderness: There is no abdominal tenderness.  Genitourinary:    Rectum: Guaiac result negative.  Musculoskeletal:        General: No swelling. Normal range of motion.     Cervical back: Full passive range of motion without pain, normal range of motion and neck supple.     Right lower leg: No edema.     Left lower leg: No edema.  Lymphadenopathy:     Upper Body:     Right upper body: No supraclavicular, axillary or pectoral adenopathy.     Left upper body: No supraclavicular, axillary or pectoral adenopathy.  Skin:    General: Skin is warm and dry.     Capillary Refill: Capillary refill takes less than 2 seconds.  Neurological:     General: No focal deficit present.     Mental Status: She is alert and  oriented to person, place, and time.     Cranial Nerves: No cranial nerve deficit.     Sensory: No sensory deficit.  Psychiatric:        Mood and Affect: Mood normal.        Behavior: Behavior normal.        Thought Content: Thought content normal.        Judgment: Judgment normal.  Assessment And Plan:     1. Encounter for annual physical exam Behavior modifications discussed and diet history reviewed.   Pt will continue to exercise regularly and modify diet with low GI, plant based foods and decrease intake of processed foods.  Recommend intake of daily multivitamin, Vitamin D, and calcium.  Recommend mammogram and colonoscopy for preventive screenings, as well as recommend immunizations that include influenza, TDAP, and Shingles - CBC - Hemoglobin A1c - Lipid panel  2. Need for Tdap vaccination Will give tetanus vaccine today while in office. Refer to order management. TDAP will be administered to adults 17-64 years old every 10 years. - Tdap vaccine greater than or equal to 7yo IM  3. Essential hypertension Comments: Blood pressure is slightly elevated, encouraged to take medication daily. Encouraged to eat a low salt diet.  - POCT Urinalysis Dipstick (81002) - Microalbumin / Creatinine Urine Ratio - EKG 12-Lead - CMP14+EGFR - hydrochlorothiazide (HYDRODIURIL) 12.5 MG tablet; Take 1 tablet (12.5 mg total) by mouth daily.  Dispense: 90 tablet; Refill: 1  4. Impacted cerumen of left ear Comments: No treatment, encouraged to avoid using Qtips and to use 1/2 water and 1/2 peroxide to ear or Debrox drops     Patient was given opportunity to ask questions. Patient verbalized understanding of the plan and was able to repeat key elements of the plan. All questions were answered to their satisfaction.   Minette Brine, FNP   I, Minette Brine, FNP, have reviewed all documentation for this visit. The documentation on 05/05/21 for the exam, diagnosis, procedures, and orders are  all accurate and complete.   THE PATIENT IS ENCOURAGED TO PRACTICE SOCIAL DISTANCING DUE TO THE COVID-19 PANDEMIC.

## 2021-05-05 NOTE — Patient Instructions (Addendum)
Health Maintenance, Female ?Adopting a healthy lifestyle and getting preventive care are important in promoting health and wellness. Ask your health care provider about: ?The right schedule for you to have regular tests and exams. ?Things you can do on your own to prevent diseases and keep yourself healthy. ?What should I know about diet, weight, and exercise? ?Eat a healthy diet ? ?Eat a diet that includes plenty of vegetables, fruits, low-fat dairy products, and lean protein. ?Do not eat a lot of foods that are high in solid fats, added sugars, or sodium. ?Maintain a healthy weight ?Body mass index (BMI) is used to identify weight problems. It estimates body fat based on height and weight. Your health care provider can help determine your BMI and help you achieve or maintain a healthy weight. ?Get regular exercise ?Get regular exercise. This is one of the most important things you can do for your health. Most adults should: ?Exercise for at least 150 minutes each week. The exercise should increase your heart rate and make you sweat (moderate-intensity exercise). ?Do strengthening exercises at least twice a week. This is in addition to the moderate-intensity exercise. ?Spend less time sitting. Even light physical activity can be beneficial. ?Watch cholesterol and blood lipids ?Have your blood tested for lipids and cholesterol at 54 years of age, then have this test every 5 years. ?Have your cholesterol levels checked more often if: ?Your lipid or cholesterol levels are high. ?You are older than 54 years of age. ?You are at high risk for heart disease. ?What should I know about cancer screening? ?Depending on your health history and family history, you may need to have cancer screening at various ages. This may include screening for: ?Breast cancer. ?Cervical cancer. ?Colorectal cancer. ?Skin cancer. ?Lung cancer. ?What should I know about heart disease, diabetes, and high blood pressure? ?Blood pressure and heart  disease ?High blood pressure causes heart disease and increases the risk of stroke. This is more likely to develop in people who have high blood pressure readings or are overweight. ?Have your blood pressure checked: ?Every 3-5 years if you are 19-36 years of age. ?Every year if you are 48 years old or older. ?Diabetes ?Have regular diabetes screenings. This checks your fasting blood sugar level. Have the screening done: ?Once every three years after age 18 if you are at a normal weight and have a low risk for diabetes. ?More often and at a younger age if you are overweight or have a high risk for diabetes. ?What should I know about preventing infection? ?Hepatitis B ?If you have a higher risk for hepatitis B, you should be screened for this virus. Talk with your health care provider to find out if you are at risk for hepatitis B infection. ?Hepatitis C ?Testing is recommended for: ?Everyone born from [birth year] through 1963. ?Anyone with known risk factors for hepatitis C. ?Sexually transmitted infections (STIs) ?Get screened for STIs, including gonorrhea and chlamydia, if: ?You are sexually active and are younger than 54 years of age. ?You are older than 54 years of age and your health care provider tells you that you are at risk for this type of infection. ?Your sexual activity has changed since you were last screened, and you are at increased risk for chlamydia or gonorrhea. Ask your health care provider if you are at risk. ?Ask your health care provider about whether you are at high risk for HIV. Your health care provider may recommend a prescription medicine to help prevent HIV  infection. If you choose to take medicine to prevent HIV, you should first get tested for HIV. You should then be tested every 3 months for as long as you are taking the medicine. ?Pregnancy ?If you are about to stop having your period (premenopausal) and you may become pregnant, seek counseling before you get pregnant. ?Take 400 to 800  micrograms (mcg) of folic acid every day if you become pregnant. ?Ask for birth control (contraception) if you want to prevent pregnancy. ?Osteoporosis and menopause ?Osteoporosis is a disease in which the bones lose minerals and strength with aging. This can result in bone fractures. If you are 57 years old or older, or if you are at risk for osteoporosis and fractures, ask your health care provider if you should: ?Be screened for bone loss. ?Take a calcium or vitamin D supplement to lower your risk of fractures. ?Be given hormone replacement therapy (HRT) to treat symptoms of menopause. ?Follow these instructions at home: ?Alcohol use ?Do not drink alcohol if: ?Your health care provider tells you not to drink. ?You are pregnant, may be pregnant, or are planning to become pregnant. ?If you drink alcohol: ?Limit how much you have to: ?0-1 drink a day. ?Know how much alcohol is in your drink. In the [place] one drink equals one 12 oz bottle of beer (355 mL), one 5 oz glass of wine (148 mL), or one 1? oz glass of hard liquor (44 mL). ?Lifestyle ?Do not use any products that contain nicotine or tobacco. These products include cigarettes, chewing tobacco, and vaping devices, such as e-cigarettes. If you need help quitting, ask your health care provider. ?Do not use street drugs. ?Do not share needles. ?Ask your health care provider for help if you need support or information about quitting drugs. ?General instructions ?Schedule regular health, dental, and eye exams. ?Stay current with your vaccines. ?Tell your health care provider if: ?You often feel depressed. ?You have ever been abused or do not feel safe at home. ?Summary ?Adopting a healthy lifestyle and getting preventive care are important in promoting health and wellness. ?Follow your health care provider's instructions about healthy diet, exercising, and getting tested or screened for diseases. ?Follow your health care provider's instructions on monitoring your  cholesterol and blood pressure. ?This information is not intended to replace advice given to you by your health care provider. Make sure you discuss any questions you have with your health care provider. ?Document Revised: 05/10/2020 Document Reviewed: 05/10/2020 ?Elsevier Patient Education ? 2023 [employer] ? ? ?Tdap (Tetanus, Diphtheria, Pertussis) Vaccine: What You Need to Know ?1. Why get vaccinated? ?Tdap vaccine can prevent tetanus, diphtheria, and pertussis. ?Diphtheria and pertussis spread from person to person. Tetanus enters the body through cuts or wounds. ?TETANUS (T) causes painful stiffening of the muscles. Tetanus can lead to serious health problems, including being unable to open the mouth, having trouble swallowing and breathing, or death. ?DIPHTHERIA (D) can lead to difficulty breathing, heart failure, paralysis, or death. ?PERTUSSIS (aP), also known as "whooping cough," can cause uncontrollable, violent coughing that makes it hard to breathe, eat, or drink. Pertussis can be extremely serious especially in babies and young children, causing pneumonia, convulsions, brain damage, or death. In teens and adults, it can cause weight loss, loss of bladder control, passing out, and rib fractures from severe coughing. ?2. Tdap vaccine ?Tdap is only for children 7 years and older, adolescents, and adults.  ?Adolescents should receive a single dose of Tdap, preferably at age 29  or 12 years. ?Pregnant people should get a dose of Tdap during every pregnancy, preferably during the early part of the third trimester, to help protect the newborn from pertussis. Infants are most at risk for severe, life-threatening complications from pertussis. ?Adults who have never received Tdap should get a dose of Tdap. ?Also, adults should receive a booster dose of either Tdap or Td (a different vaccine that protects against tetanus and diphtheria but not pertussis) every 10 years, or after 5 years in the case of a severe  or dirty wound or burn. ?Tdap may be given at the same time as other vaccines. ?3. Talk with your health care provider ?Tell your vaccine provider if the person getting the vaccine: ?Has had an allergic reacti

## 2021-05-06 LAB — MICROALBUMIN / CREATININE URINE RATIO
Creatinine, Urine: 75.1 mg/dL
Microalb/Creat Ratio: <4 mg/g creat (ref 0–29)
Microalbumin, Urine: <3 ug/mL

## 2021-05-06 LAB — CBC
Hematocrit: 42.8 % (ref 34.0–46.6)
Hemoglobin: 14.4 g/dL (ref 11.1–15.9)
MCH: 29 pg (ref 26.6–33.0)
MCHC: 33.6 g/dL (ref 31.5–35.7)
MCV: 86 fL (ref 79–97)
Platelets: 378 10*3/uL (ref 150–450)
RBC: 4.96 x10E6/uL (ref 3.77–5.28)
RDW: 11.9 % (ref 11.7–15.4)
WBC: 5.2 10*3/uL (ref 3.4–10.8)

## 2021-05-06 LAB — CMP14+EGFR
ALT: 10 IU/L (ref 0–32)
AST: 22 IU/L (ref 0–40)
Albumin/Globulin Ratio: 1.9 (ref 1.2–2.2)
Albumin: 4.9 g/dL (ref 3.8–4.9)
Alkaline Phosphatase: 83 IU/L (ref 44–121)
BUN/Creatinine Ratio: 8 — ABNORMAL LOW (ref 9–23)
BUN: 8 mg/dL (ref 6–24)
Bilirubin Total: 0.7 mg/dL (ref 0.0–1.2)
CO2: 24 mmol/L (ref 20–29)
Calcium: 9.6 mg/dL (ref 8.7–10.2)
Chloride: 88 mmol/L — ABNORMAL LOW (ref 96–106)
Creatinine, Ser: 0.96 mg/dL (ref 0.57–1.00)
Globulin, Total: 2.6 g/dL (ref 1.5–4.5)
Glucose: 88 mg/dL (ref 70–99)
Potassium: 4.2 mmol/L (ref 3.5–5.2)
Sodium: 127 mmol/L — ABNORMAL LOW (ref 134–144)
Total Protein: 7.5 g/dL (ref 6.0–8.5)
eGFR: 71 mL/min/{1.73_m2} (ref >59)

## 2021-05-06 LAB — LIPID PANEL
Chol/HDL Ratio: 2.3 ratio (ref 0.0–4.4)
Cholesterol, Total: 198 mg/dL (ref 100–199)
HDL: 87 mg/dL (ref >39)
LDL Chol Calc (NIH): 102 mg/dL — ABNORMAL HIGH (ref 0–99)
Triglycerides: 47 mg/dL (ref 0–149)
VLDL Cholesterol Cal: 9 mg/dL (ref 5–40)

## 2021-05-06 LAB — HEMOGLOBIN A1C
Est. average glucose Bld gHb Est-mCnc: 105 mg/dL
Hgb A1c MFr Bld: 5.3 % (ref 4.8–5.6)

## 2021-06-01 ENCOUNTER — Telehealth: Payer: 59 | Admitting: Physician Assistant

## 2021-06-01 DIAGNOSIS — R3989 Other symptoms and signs involving the genitourinary system: Secondary | ICD-10-CM

## 2021-06-01 MED ORDER — CEPHALEXIN 500 MG PO CAPS
500.0000 mg | ORAL_CAPSULE | Freq: Two times a day (BID) | ORAL | 0 refills | Status: DC
  Filled 2021-06-01: qty 14, 7d supply, fill #0

## 2021-06-01 NOTE — Progress Notes (Signed)

## 2021-06-01 NOTE — Progress Notes (Signed)
I have spent 5 minutes in review of e-visit questionnaire, review and updating patient chart, medical decision making and response to patient.   Bayden Gil Cody Shakeila Pfarr, PA-C    

## 2021-06-02 ENCOUNTER — Other Ambulatory Visit (HOSPITAL_COMMUNITY): Payer: Self-pay

## 2021-06-15 ENCOUNTER — Other Ambulatory Visit (HOSPITAL_COMMUNITY): Payer: Self-pay

## 2021-06-20 ENCOUNTER — Other Ambulatory Visit: Payer: Self-pay | Admitting: Nurse Practitioner

## 2021-06-20 DIAGNOSIS — E871 Hypo-osmolality and hyponatremia: Secondary | ICD-10-CM

## 2021-06-20 LAB — BMP8+EGFR
BUN/Creatinine Ratio: 8 — ABNORMAL LOW (ref 9–23)
BUN: 8 mg/dL (ref 6–24)
CO2: 22 mmol/L (ref 20–29)
Calcium: 8.8 mg/dL (ref 8.7–10.2)
Chloride: 105 mmol/L (ref 96–106)
Creatinine, Ser: 0.95 mg/dL (ref 0.57–1.00)
Glucose: 81 mg/dL (ref 70–99)
Potassium: 4.4 mmol/L (ref 3.5–5.2)
Sodium: 138 mmol/L (ref 134–144)
eGFR: 72 mL/min/{1.73_m2} (ref >59)

## 2021-06-20 NOTE — Addendum Note (Signed)
Addended by: Arnette Felts F on: 06/20/2021 09:12 AM   Modules accepted: Orders

## 2021-06-21 ENCOUNTER — Other Ambulatory Visit: Payer: 59

## 2021-09-16 ENCOUNTER — Encounter: Payer: Self-pay | Admitting: Nurse Practitioner

## 2021-09-27 ENCOUNTER — Encounter: Payer: Self-pay | Admitting: Nurse Practitioner

## 2021-10-04 ENCOUNTER — Telehealth: Payer: Self-pay

## 2021-10-04 NOTE — Telephone Encounter (Signed)
Attempt made to contact Coreen MARVIA TROOST is a 56 y.o. female re: Message on the clinic VM Pt was not available.  LM on the VM for the patient to call me back.

## 2021-10-04 NOTE — Telephone Encounter (Signed)
Spoke to patient. Would like new patient visit for incontinence. Explained that we would be having some apts open soon and I would put her on a call list. Her availability is anytime. She understands will still be several months for apt though. KW CMA

## 2021-10-07 ENCOUNTER — Other Ambulatory Visit (HOSPITAL_COMMUNITY): Payer: Self-pay

## 2021-10-10 ENCOUNTER — Ambulatory Visit (INDEPENDENT_AMBULATORY_CARE_PROVIDER_SITE_OTHER): Payer: 59 | Admitting: Obstetrics and Gynecology

## 2021-10-10 ENCOUNTER — Encounter: Payer: Self-pay | Admitting: Obstetrics and Gynecology

## 2021-10-10 VITALS — BP 158/111 | HR 103 | Ht 61.5 in | Wt 134.0 lb

## 2021-10-10 DIAGNOSIS — N393 Stress incontinence (female) (male): Secondary | ICD-10-CM | POA: Diagnosis not present

## 2021-10-10 NOTE — Progress Notes (Signed)
[facility] New Patient Evaluation and Consultation  Referring Provider: Minette Brine, FNP PCP: Karen Cameron, [place] Date of Service: 10/10/2021  SUBJECTIVE Chief Complaint: New Patient (Initial Visit) (Karen Cameron is a 56 y.o. female complains of urinary incontinence./)  History of Present Illness: Karen Cameron is a 56 y.o. Black or African-American female presenting for evaluation of incontinence    Urinary Symptoms: Leaks urine with cough/ sneeze, laughing, exercise, with a full bladder, and with movement to the bathroom Leaks 2  time(s) per day. SUI > UUI.  Pad use: 2-3 liners/ mini-pads per day.   She is bothered by her UI symptoms. Has done pelvic PT at [facility].   Day time voids 5.  Nocturia: 2 times per night to void. Voiding dysfunction: she empties her bladder well.  does not use a catheter to empty bladder.  When urinating, she feels dribbling after finishing Drinks: 1 cup coffee in AM, water   UTIs: 1 UTI's in the last year.   Denies history of blood in urine and kidney or bladder stones  Pelvic Organ Prolapse Symptoms:                  She Denies a feeling of a bulge the vaginal area.   Bowel Symptom: Bowel movements: 1 time(s) per day Stool consistency: soft  Straining: no.  Splinting: no.  Incomplete evacuation: no.  She Denies accidental bowel leakage / fecal incontinence Bowel regimen: fiber   Sexual Function Sexually active: yes.  Sexual orientation:  heterosexual Pain with sex: No  Pelvic Pain Denies pelvic pain   Past Medical History:  Past Medical History:  Diagnosis Date   Hypertension    Nausea & vomiting    SAB (spontaneous abortion)    X2     Past Surgical History:   Past Surgical History:  Procedure Laterality Date   CHOLECYSTECTOMY     DILATION AND CURETTAGE OF UTERUS     TUBAL LIGATION       Past OB/GYN History: OB History  Gravida Para Term Preterm AB Living  4       2 2   SAB IAB Ectopic  Multiple Live Births  2       2    # Outcome Date GA Lbr Len/2nd Weight Sex Delivery Anes PTL Lv  4 Gravida           3 Gravida           2 SAB           1 SAB             Vaginal deliveries: 2,  Forceps/ Vacuum deliveries: 0, Cesarean section: 0 No LMP recorded. (Menstrual status: Irregular Periods). Contraception: BTL. Last pap smear was 2023.    Medications: She has a current medication list which includes the following prescription(s): fluticasone, hydrochlorothiazide, levocetirizine, meclizine, montelukast, and ondansetron.   Allergies: Patient is allergic to cat hair extract, dust mite extract, and other.   Social History:  Social History   Tobacco Use   Smoking status: Never   Smokeless tobacco: Never  Vaping Use   Vaping Use: Never used  Substance Use Topics   Alcohol use: Yes   Drug use: No    Relationship status: married She lives with her husband and son.   She is employed as an Therapist, sports. Regular exercise: No History of abuse: No  Family History:   Family History  Problem Relation Age of Onset   Diabetes  Mother    Cancer Father        HEAD,NECK, THROAT   Diabetes Maternal Aunt    Diabetes Maternal Uncle    Diabetes Paternal Grandmother    Allergic rhinitis Neg Hx    Asthma Neg Hx    Eczema Neg Hx    Urticaria Neg Hx      Review of Systems: Review of Systems  Constitutional:  Negative for fever, malaise/fatigue and weight loss.  Respiratory:  Positive for cough. Negative for shortness of breath and wheezing.   Cardiovascular:  Negative for chest pain, palpitations and leg swelling.  Gastrointestinal:  Negative for abdominal pain and blood in stool.  Genitourinary:  Negative for dysuria.  Musculoskeletal:  Negative for myalgias.  Skin:  Negative for rash.  Neurological:  Negative for dizziness and headaches.  Endo/Heme/Allergies:  Does not bruise/bleed easily.       + hot flashes  Psychiatric/Behavioral:  Negative for depression. The patient is not  nervous/anxious.      OBJECTIVE Physical Exam: Vitals:   10/10/21 0954  BP: (!) 158/111  Pulse: (!) 103  Weight: 134 lb (60.8 kg)  Height: 5' 1.5" (1.562 m)    Physical Exam Constitutional:      General: She is not in acute distress. Pulmonary:     Effort: Pulmonary effort is normal.  Abdominal:     General: There is no distension.     Palpations: Abdomen is soft.     Tenderness: There is no abdominal tenderness. There is no rebound.  Musculoskeletal:        General: No swelling. Normal range of motion.  Skin:    General: Skin is warm and dry.     Findings: No rash.  Neurological:     Mental Status: She is alert and oriented to person, place, and time.  Psychiatric:        Mood and Affect: Mood normal.        Behavior: Behavior normal.      GU / Detailed Urogynecologic Evaluation:  Pelvic Exam: Normal external female genitalia; Bartholin's and Skene's glands normal in appearance; urethral meatus normal in appearance, no urethral masses or discharge.   CST: negative  Speculum exam reveals normal vaginal mucosa without atrophy. Cervix normal appearance. Uterus normal single, nontender. Adnexa no mass, fullness, tenderness.     Pelvic floor strength IV/V  Pelvic floor musculature: Right levator non-tender, Right obturator non-tender, Left levator non-tender, Left obturator non-tender  POP-Q:   POP-Q  -3                                            Aa   -3                                           Ba  -8                                              C   4  Gh  4                                            Pb  8                                            tvl   -3                                            Ap  -3                                            Bp  -7                                              D     Rectal Exam:  Normal external rectum  Post-Void Residual (PVR) by Bladder Scan: In order to  evaluate bladder emptying, we discussed obtaining a postvoid residual and she agreed to this procedure.  Procedure: The ultrasound unit was placed on the patient's abdomen in the suprapubic region after the patient had voided. A PVR of 34 ml was obtained by bladder scan.  Laboratory Results: POC urine: negative   ASSESSMENT AND PLAN Karen Cameron is a 56 y.o. with:  1. SUI (stress urinary incontinence, female)    - For treatment of stress urinary incontinence,  non-surgical options include expectant management, weight loss, physical therapy, as well as a pessary.  Surgical options include a midurethral sling, Burch urethropexy, and transurethral injection of a bulking agent. - She is interested in urethral bulking. Will have her return for simple CMG to demonstrate leakage then can arrange procedure.     Jaquita Folds, MD

## 2021-10-11 ENCOUNTER — Other Ambulatory Visit (HOSPITAL_COMMUNITY): Payer: Self-pay

## 2021-10-21 ENCOUNTER — Encounter: Payer: Self-pay | Admitting: Obstetrics and Gynecology

## 2021-10-21 ENCOUNTER — Other Ambulatory Visit (HOSPITAL_COMMUNITY): Payer: Self-pay

## 2021-10-21 ENCOUNTER — Ambulatory Visit (INDEPENDENT_AMBULATORY_CARE_PROVIDER_SITE_OTHER): Payer: 59 | Admitting: Obstetrics and Gynecology

## 2021-10-21 VITALS — BP 158/102 | HR 96

## 2021-10-21 DIAGNOSIS — N393 Stress incontinence (female) (male): Secondary | ICD-10-CM | POA: Diagnosis not present

## 2021-10-21 DIAGNOSIS — Z01818 Encounter for other preprocedural examination: Secondary | ICD-10-CM

## 2021-10-21 MED ORDER — DIAZEPAM 5 MG PO TABS
ORAL_TABLET | ORAL | 0 refills | Status: DC
  Filled 2021-10-21: qty 1, 1d supply, fill #0

## 2021-10-21 MED ORDER — SULFAMETHOXAZOLE-TRIMETHOPRIM 800-160 MG PO TABS
1.0000 | ORAL_TABLET | Freq: Two times a day (BID) | ORAL | 0 refills | Status: AC
  Filled 2021-10-21: qty 2, 1d supply, fill #0

## 2021-10-21 NOTE — Progress Notes (Signed)
[facility] Return Visit  SUBJECTIVE  History of Present Illness: Karen Cameron is a 56 y.o. female seen in follow-up for simple CMG. She is interested in urethral bulking for her stress incontinence.    Past Medical History: Patient  has a past medical history of Hypertension, Nausea & vomiting, and SAB (spontaneous abortion).   Past Surgical History: She  has a past surgical history that includes Cholecystectomy; Tubal ligation; and Dilation and curettage of uterus.   Medications: She has a current medication list which includes the following prescription(s): diazepam, fluticasone, hydrochlorothiazide, levocetirizine, meclizine, montelukast, sulfamethoxazole-trimethoprim, and ondansetron.   Allergies: Patient is allergic to cat hair extract, dust mite extract, and other.   Social History: Patient  reports that she has never smoked. She has never used smokeless tobacco. She reports current alcohol use. She reports that she does not use drugs.      OBJECTIVE     Physical Exam: Vitals:   10/21/21 1345  BP: (!) 158/102  Pulse: 96   Gen: No apparent distress, A&O x 3.  Detailed Urogynecologic Evaluation:  Deferred. Prior exam showed:  POP-Q   -3                                            Aa   -3                                           Ba   -8                                              C    4                                            Gh   4                                            Pb   8                                            tvl    -3                                            Ap   -3                                            Bp   -7  D        Verbal consent was obtained to perform simple CMG procedure:   Prolapse was reduced using 2 large cotton swabs. Urethra was prepped with betadine and a 54F catheter was placed and bladder was drained completely. The bladder was then backfilled  with sterile water by gravity.  First sensation: First Desire: Strong Desire: Capacity: Cough stress test was positive. Valsalva stress test was negative.  Catheter was replaced to drain the bladder.  Interpretation: CMG showed normal sensation, and normal cystometric capacity. Findings positive for stress incontinence, negative for detrusor overactivity.     ASSESSMENT AND PLAN    Ms. Wulf is a 56 y.o. with:  1. SUI (stress urinary incontinence, female)   2. Pre-op evaluation    - SUI confirmed. Reviewed urethral bulking procedure with Bulkamid and its risks and benefits. May need a second injection or repeat injections in the future.  - Reviewed risk of short term catheterization vs need to self cath if there is urinary retention following the procedure.  - Prescribed bactrim to take day of procedure and 1 tab of valium 5mg  to take 30 min prior to procedure. She understands she will need a driver.  - She will follow up with PCP regarding elevated BP.   All questions answered  , MD

## 2021-10-24 ENCOUNTER — Encounter: Payer: Self-pay | Admitting: *Deleted

## 2021-11-03 ENCOUNTER — Ambulatory Visit (INDEPENDENT_AMBULATORY_CARE_PROVIDER_SITE_OTHER): Payer: 59 | Admitting: Obstetrics and Gynecology

## 2021-11-03 ENCOUNTER — Encounter: Payer: Self-pay | Admitting: Obstetrics and Gynecology

## 2021-11-03 ENCOUNTER — Other Ambulatory Visit (HOSPITAL_COMMUNITY)
Admission: RE | Admit: 2021-11-03 | Discharge: 2021-11-03 | Disposition: A | Discharge status: Home / Self Care | Payer: 59 | Attending: Obstetrics and Gynecology | Admitting: Obstetrics and Gynecology

## 2021-11-03 ENCOUNTER — Other Ambulatory Visit (HOSPITAL_COMMUNITY): Payer: Self-pay

## 2021-11-03 VITALS — BP 139/89 | HR 105

## 2021-11-03 DIAGNOSIS — R350 Frequency of micturition: Secondary | ICD-10-CM | POA: Diagnosis not present

## 2021-11-03 DIAGNOSIS — R82998 Other abnormal findings in urine: Secondary | ICD-10-CM | POA: Diagnosis not present

## 2021-11-03 DIAGNOSIS — N393 Stress incontinence (female) (male): Secondary | ICD-10-CM | POA: Diagnosis not present

## 2021-11-03 LAB — POCT URINALYSIS DIPSTICK
Bilirubin, UA: NEGATIVE
Glucose, UA: NEGATIVE
Ketones, UA: NEGATIVE
Nitrite, UA: POSITIVE
Protein, UA: NEGATIVE
Spec Grav, UA: 1.015 (ref 1.010–1.025)
Urobilinogen, UA: 0.2 E.U./dL
pH, UA: 7 (ref 5.0–8.0)

## 2021-11-03 MED ORDER — DIAZEPAM 5 MG PO TABS
ORAL_TABLET | ORAL | 0 refills | Status: DC
  Filled 2021-11-03: qty 1, 1d supply, fill #0

## 2021-11-03 MED ORDER — SULFAMETHOXAZOLE-TRIMETHOPRIM 800-160 MG PO TABS
1.0000 | ORAL_TABLET | Freq: Two times a day (BID) | ORAL | 0 refills | Status: AC
  Filled 2021-11-03: qty 6, 3d supply, fill #0

## 2021-11-03 MED ORDER — SULFAMETHOXAZOLE-TRIMETHOPRIM 800-160 MG PO TABS
ORAL_TABLET | ORAL | 0 refills | Status: DC
  Filled 2021-11-03: qty 2, 1d supply, fill #0

## 2021-11-03 NOTE — Addendum Note (Signed)
Addended by: Elita Quick on: 11/03/2021 12:59 PM   Modules accepted: Orders

## 2021-11-03 NOTE — Progress Notes (Signed)
Patient presented today for Bulkamid injection. POC urine showed moderate leukocytes and positive nitrites so decision was made to treat for UTI and delay procedure. She had taken one dose of bactrim today. Will prescribe an additional 3 days of bactrim DS BID. Will reschedule procedure.   Jaquita Folds, MD

## 2021-11-05 LAB — URINE CULTURE: Culture: >=100000 — AB

## 2021-11-07 NOTE — Progress Notes (Unsigned)
I,Victoria T Hamilton,acting as a Education administrator for Minette Brine, FNP.,have documented all relevant documentation on the behalf of Minette Brine, FNP,as directed by  Minette Brine, FNP while in the presence of Minette Brine, [place].    Subjective:     Patient ID: Karen Cameron , female    DOB: Oct 07, 1965 , 56 y.o.   MRN: [id number]   Chief Complaint  Patient presents with   Hypertension    HPI  Pt presents for bpc. Overall she is doing well.   Denies headache, chest pain, SOB , blurred vision. She has no specific questions or concerns at this time.   She is getting a urethral bulking with Dr. Windy Canny today, taking bactrim and valium.   Hypertension This is a chronic problem. The current episode started more than 1 year ago. The problem has been gradually improving since onset. The problem is controlled (Fair control). Pertinent negatives include no anxiety. Risk factors for coronary artery disease include sedentary lifestyle. Past treatments include diuretics. Compliance problems include exercise.  There is no history of angina. There is no history of chronic renal disease.     Past Medical History:  Diagnosis Date   Hypertension    Nausea & vomiting    SAB (spontaneous abortion)    X2     Family History  Problem Relation Age of Onset   Diabetes Mother    Cancer Father        HEAD,NECK, THROAT   Diabetes Maternal Aunt    Diabetes Maternal Uncle    Diabetes Paternal Grandmother    Allergic rhinitis Neg Hx    Asthma Neg Hx    Eczema Neg Hx    Urticaria Neg Hx      Current Outpatient Medications:    hydrochlorothiazide (HYDRODIURIL) 12.5 MG tablet, Take 1 tablet (12.5 mg total) by mouth daily., Disp: 90 tablet, Rfl: 1   levocetirizine (XYZAL) 5 MG tablet, Take 1 tablet (5 mg total) by mouth every evening., Disp: 90 tablet, Rfl: 2   meclizine (ANTIVERT) 25 MG tablet, Take 1 tablet (25 mg total) by mouth 3 (three) times daily as needed for dizziness., Disp: 90 tablet, Rfl: 1    diazepam (VALIUM) 5 MG tablet, Take 1 tablet 30 min prior to procedure (Patient not taking: Reported on 11/08/2021), Disp: 1 tablet, Rfl: 0   fluticasone (FLONASE) 50 MCG/ACT nasal spray, Place 2 sprays into both nostrils daily. (Patient not taking: Reported on 11/08/2021), Disp: 16 g, Rfl: 6   montelukast (SINGULAIR) 10 MG tablet, TAKE 1 TABLET (10 MG TOTAL) BY MOUTH AT BEDTIME. (Patient not taking: Reported on 11/08/2021), Disp: 30 tablet, Rfl: 0   ondansetron (ZOFRAN) 4 MG tablet, Take 1 tablet (4 mg total) by mouth every 8 (eight) hours as needed for nausea or vomiting. (Patient not taking: Reported on 11/03/2021), Disp: 20 tablet, Rfl: 0   sulfamethoxazole-trimethoprim (BACTRIM DS) 800-160 MG tablet, Take one tablet prior to procedure and second 12 hours later (Patient not taking: Reported on 11/08/2021), Disp: 2 tablet, Rfl: 0   Allergies  Allergen Reactions   Cat Hair Extract    Dust Mite Extract    Other      Review of Systems  Constitutional: Negative.   Respiratory: Negative.    Cardiovascular: Negative.   Neurological: Negative.   Psychiatric/Behavioral: Negative.       Today's Vitals   11/08/21 0821 11/08/21 0844  BP: 122/86 122/84  Pulse: 86   Temp: 97.9 F (36.6 C)   SpO2: 98%  Weight: 129 lb 9.6 oz (58.8 kg)   Height: _0  (1.549 m)    Body mass index is 24.49 kg/m.  Wt Readings from Last 3 Encounters:  11/08/21 129 lb 9.6 oz (58.8 kg)  10/10/21 134 lb (60.8 kg)  05/05/21 130 lb (59 kg)    Objective:  Physical Exam Vitals reviewed.  Constitutional:      General: She is not in acute distress.    Appearance: Normal appearance. She is well-developed.  HENT:     Head: Normocephalic and atraumatic.  Eyes:     Pupils: Pupils are equal, round, and reactive to light.  Cardiovascular:     Rate and Rhythm: Normal rate and regular rhythm.     Pulses: Normal pulses.     Heart sounds: Normal heart sounds. No murmur heard. Pulmonary:     Effort: Pulmonary effort is  normal. No respiratory distress.     Breath sounds: Normal breath sounds. No wheezing.  Musculoskeletal:        General: Normal range of motion.  Skin:    General: Skin is warm and dry.     Capillary Refill: Capillary refill takes less than 2 seconds.  Neurological:     General: No focal deficit present.     Mental Status: She is alert and oriented to person, place, and time.     Cranial Nerves: No cranial nerve deficit.     Motor: No weakness.  Psychiatric:        Mood and Affect: Mood normal.         Assessment And Plan:     1. Essential hypertension Comments: Blood pressure is improved since her last visit. Repeat today improved, encouraged to stay well hydrated with water and continue current medications - CMP14+EGFR  2. Elevated LDL cholesterol level Comments: Slightly elevated at last visit, will recheck today. Encouraged to eat a low fat diet. - Lipid panel     Patient was given opportunity to ask questions. Patient verbalized understanding of the plan and was able to repeat key elements of the plan. All questions were answered to their satisfaction.  Minette Brine, FNP   I, Minette Brine, FNP, have reviewed all documentation for this visit. The documentation on 11/08/21 for the exam, diagnosis, procedures, and orders are all accurate and complete.   IF YOU HAVE BEEN REFERRED TO A SPECIALIST, IT MAY TAKE 1-2 WEEKS TO SCHEDULE/PROCESS THE REFERRAL. IF YOU HAVE NOT HEARD FROM US/SPECIALIST IN TWO WEEKS, PLEASE GIVE [place] A CALL AT [phone].   THE PATIENT IS ENCOURAGED TO PRACTICE SOCIAL DISTANCING DUE TO THE COVID-19 PANDEMIC.

## 2021-11-07 NOTE — Patient Instructions (Signed)
Hypertension, Adult ?Hypertension is another name for high blood pressure. High blood pressure forces your heart to work harder to pump blood. This can cause problems over time. ?There are two numbers in a blood pressure reading. There is a top number (systolic) over a bottom number (diastolic). It is best to have a blood pressure that is below 120/80. ?What are the causes? ?The cause of this condition is not known. Some other conditions can lead to high blood pressure. ?What increases the risk? ?Some lifestyle factors can make you more likely to develop high blood pressure: ?Smoking. ?Not getting enough exercise or physical activity. ?Being overweight. ?Having too much fat, sugar, calories, or salt (sodium) in your diet. ?Drinking too much alcohol. ?Other risk factors include: ?Having any of these conditions: ?Heart disease. ?Diabetes. ?High cholesterol. ?Kidney disease. ?Obstructive sleep apnea. ?Having a family history of high blood pressure and high cholesterol. ?Age. The risk increases with age. ?Stress. ?What are the signs or symptoms? ?High blood pressure may not cause symptoms. Very high blood pressure (hypertensive crisis) may cause: ?Headache. ?Fast or uneven heartbeats (palpitations). ?Shortness of breath. ?Nosebleed. ?Vomiting or feeling like you may vomit (nauseous). ?Changes in how you see. ?Very bad chest pain. ?Feeling dizzy. ?Seizures. ?How is this treated? ?This condition is treated by making healthy lifestyle changes, such as: ?Eating healthy foods. ?Exercising more. ?Drinking less alcohol. ?Your doctor may prescribe medicine if lifestyle changes do not help enough and if: ?Your top number is above 130. ?Your bottom number is above 80. ?Your personal target blood pressure may vary. ?Follow these instructions at home: ?Eating and drinking ? ?If told, follow the DASH eating plan. To follow this plan: ?Fill one half of your plate at each meal with fruits and vegetables. ?Fill one fourth of your plate  at each meal with whole grains. Whole grains include whole-wheat pasta, brown rice, and whole-grain bread. ?Eat or drink low-fat dairy products, such as skim milk or low-fat yogurt. ?Fill one fourth of your plate at each meal with low-fat (lean) proteins. Low-fat proteins include fish, chicken without skin, eggs, beans, and tofu. ?Avoid fatty meat, cured and processed meat, or chicken with skin. ?Avoid pre-made or processed food. ?Limit the amount of salt in your diet to less than 1,500 mg each day. ?Do not drink alcohol if: ?Your doctor tells you not to drink. ?You are pregnant, may be pregnant, or are planning to become pregnant. ?If you drink alcohol: ?Limit how much you have to: ?0-1 drink a day for women. ?0-2 drinks a day for men. ?Know how much alcohol is in your drink. In the [place] one drink equals one 12 oz bottle of beer (355 mL), one 5 oz glass of wine (148 mL), or one 1? oz glass of hard liquor (44 mL). ?Lifestyle ? ?Work with your doctor to stay at a healthy weight or to lose weight. Ask your doctor what the best weight is for you. ?Get at least 30 minutes of exercise that causes your heart to beat faster (aerobic exercise) most days of the week. This may include walking, swimming, or biking. ?Get at least 30 minutes of exercise that strengthens your muscles (resistance exercise) at least 3 days a week. This may include lifting weights or doing Pilates. ?Do not smoke or use any products that contain nicotine or tobacco. If you need help quitting, ask your doctor. ?Check your blood pressure at home as told by your doctor. ?Keep all follow-up visits. ?Medicines ?Take over-the-counter and prescription medicines   only as told by your doctor. Follow directions carefully. ?Do not skip doses of blood pressure medicine. The medicine does not work as well if you skip doses. Skipping doses also puts you at risk for problems. ?Ask your doctor about side effects or reactions to medicines that you should watch  for. ?Contact a doctor if: ?You think you are having a reaction to the medicine you are taking. ?You have headaches that keep coming back. ?You feel dizzy. ?You have swelling in your ankles. ?You have trouble with your vision. ?Get help right away if: ?You get a very bad headache. ?You start to feel mixed up (confused). ?You feel weak or numb. ?You feel faint. ?You have very bad pain in your: ?Chest. ?Belly (abdomen). ?You vomit more than once. ?You have trouble breathing. ?These symptoms may be an emergency. Get help right away. Call 911. ?Do not wait to see if the symptoms will go away. ?Do not drive yourself to the hospital. ?Summary ?Hypertension is another name for high blood pressure. ?High blood pressure forces your heart to work harder to pump blood. ?For most people, a normal blood pressure is less than 120/80. ?Making healthy choices can help lower blood pressure. If your blood pressure does not get lower with healthy choices, you may need to take medicine. ?This information is not intended to replace advice given to you by your health care provider. Make sure you discuss any questions you have with your health care provider. ?Document Revised: 10/07/2020 Document Reviewed: 10/07/2020 ?Elsevier Patient Education ? 2023 Elsevier Inc[employer] ? ?

## 2021-11-08 ENCOUNTER — Ambulatory Visit (INDEPENDENT_AMBULATORY_CARE_PROVIDER_SITE_OTHER): Payer: 59 | Admitting: Obstetrics and Gynecology

## 2021-11-08 ENCOUNTER — Encounter: Payer: Self-pay | Admitting: Obstetrics and Gynecology

## 2021-11-08 ENCOUNTER — Ambulatory Visit: Payer: 59 | Admitting: Nurse Practitioner

## 2021-11-08 ENCOUNTER — Encounter: Payer: Self-pay | Admitting: Nurse Practitioner

## 2021-11-08 VITALS — BP 122/84 | HR 86 | Temp 97.9°F | Ht 61.0 in | Wt 129.6 lb

## 2021-11-08 VITALS — BP 134/91 | HR 72

## 2021-11-08 DIAGNOSIS — I10 Essential (primary) hypertension: Secondary | ICD-10-CM | POA: Diagnosis not present

## 2021-11-08 DIAGNOSIS — R350 Frequency of micturition: Secondary | ICD-10-CM

## 2021-11-08 DIAGNOSIS — E7800 Pure hypercholesterolemia, unspecified: Secondary | ICD-10-CM

## 2021-11-08 DIAGNOSIS — N393 Stress incontinence (female) (male): Secondary | ICD-10-CM | POA: Diagnosis not present

## 2021-11-08 LAB — POCT URINALYSIS DIPSTICK
Bilirubin, UA: NEGATIVE
Blood, UA: NEGATIVE
Glucose, UA: NEGATIVE
Ketones, UA: NEGATIVE
Leukocytes, UA: NEGATIVE
Nitrite, UA: NEGATIVE
Protein, UA: NEGATIVE
Spec Grav, UA: 1.015 (ref 1.010–1.025)
Urobilinogen, UA: 0.2 E.U./dL
pH, UA: 6.5 (ref 5.0–8.0)

## 2021-11-08 MED ORDER — LIDOCAINE HCL 2 % IJ SOLN: 3.0000 mL | Freq: Once | INTRAMUSCULAR | Status: DC

## 2021-11-08 MED ORDER — LIDOCAINE HCL URETHRAL/MUCOSAL 2 % EX GEL: 1.0000 | Freq: Once | CUTANEOUS | Status: DC

## 2021-11-08 NOTE — Progress Notes (Signed)
Bulkamid Injection  CC: 56 y.o. y.o. F with stress incontinence who presents for transurethral Bulkamid injection.  Patient signed her consent form.  She started antibiotic prophylaxis today.  Procedure: Time out was performed. The bladder was catheterized and 10 ml of 2% lidocaine jelly placed in the urethra. A urethral block was performed by injecting 80ml of 1% lidocaine with epinephrine at 3 and 6 o'clock adjacent to the urethra.  The needle was primed.  The cystoscope was inserted to the level of the bladder neck.  The needle was inserted 2 cm and the scope was pulled back into the urethra 2 cm.  The needle was inserted bevel up at the 5 o'clock position and the Bulkamid was injected to obtain coaptation.  This was repeated at the 2 o'clock,  10 o'clock and 7 o'clock positions.   A total of 2- 52ml syringes were used and good circumferential coaptation was noted.  The patient tolerated the procedure well. She was asked to void after the procedure.  Post-Void Residual (PVR) by Bladder Scan: In order to evaluate bladder emptying, we discussed obtaining a postvoid residual and she agreed to this procedure.  Procedure: The ultrasound unit was placed on the patient's abdomen in the suprapubic region after the patient had voided. A PVR of 15 ml was obtained by bladder scan.  ASSESSMENT: 56 y.o. y.o. s/p transurethral Bulkamid injection for stress incontinence.   PLAN: Patient will follow up in 4 weeks to reassess. Voiding and post-procedure precautions were given. She will return for heavy bleeding, fevers, dysuria lasting beyond today and incomplete emptying.  All questions were answered.  Jaquita Folds, MD

## 2021-11-08 NOTE — Patient Instructions (Signed)
Taking Care of Yourself after Urodynamics, Cystoscopy, Bulkamid Injection, or Botox Injection   Drink plenty of water for a day or two following your procedure. Try to have about 8 ounces (one cup) at a time, and do this 6 times or more per day unless you have fluid restrictitons AVOID irritative beverages such as coffee, tea, soda, alcoholic or citrus drinks for a day or two, as this may cause burning with urination.  For the first 1-2 days after the procedure, your urine may be pink or red in color. You may have some blood in your urine as a normal side effect of the procedure. Large amounts of bleeding or difficulty urinating are NOT normal. Call the nurse line if this happens or go to the nearest Emergency Room if the bleeding is heavy or you cannot urinate at all and it is after hours. If you had a Bulkamid injection in the urethra and need to be catheterized, ask for a pediatric catheter to be used (size 10 or 12-French) so the material is not pushed out of place.   You may experience some discomfort or a burning sensation with urination after having this procedure. You can use over the counter Azo or pyridium to help with burning and follow the instructions on the packaging. If it does not improve within 1-2 days, or other symptoms appear (fever, chills, or difficulty urinating) call the office to speak to a nurse.  You may return to normal daily activities such as work, school, driving, exercising and housework on the day of the procedure. If your doctor gave you a prescription, take it as ordered.     

## 2021-11-09 LAB — LIPID PANEL
Chol/HDL Ratio: 2.4 ratio (ref 0.0–4.4)
Cholesterol, Total: 197 mg/dL (ref 100–199)
HDL: 82 mg/dL (ref >39)
LDL Chol Calc (NIH): 106 mg/dL — ABNORMAL HIGH (ref 0–99)
Triglycerides: 46 mg/dL (ref 0–149)
VLDL Cholesterol Cal: 9 mg/dL (ref 5–40)

## 2021-11-09 LAB — CMP14+EGFR
ALT: 12 IU/L (ref 0–32)
AST: 23 IU/L (ref 0–40)
Albumin/Globulin Ratio: 1.9 (ref 1.2–2.2)
Albumin: 4.9 g/dL (ref 3.8–4.9)
Alkaline Phosphatase: 88 IU/L (ref 44–121)
BUN/Creatinine Ratio: 7 — ABNORMAL LOW (ref 9–23)
BUN: 9 mg/dL (ref 6–24)
Bilirubin Total: 0.5 mg/dL (ref 0.0–1.2)
CO2: 24 mmol/L (ref 20–29)
Calcium: 9.7 mg/dL (ref 8.7–10.2)
Chloride: 91 mmol/L — ABNORMAL LOW (ref 96–106)
Creatinine, Ser: 1.25 mg/dL — ABNORMAL HIGH (ref 0.57–1.00)
Globulin, Total: 2.6 g/dL (ref 1.5–4.5)
Glucose: 91 mg/dL (ref 70–99)
Potassium: 4.3 mmol/L (ref 3.5–5.2)
Sodium: 131 mmol/L — ABNORMAL LOW (ref 134–144)
Total Protein: 7.5 g/dL (ref 6.0–8.5)
eGFR: 52 mL/min/{1.73_m2} — ABNORMAL LOW (ref >59)

## 2021-11-15 ENCOUNTER — Ambulatory Visit (INDEPENDENT_AMBULATORY_CARE_PROVIDER_SITE_OTHER): Payer: 59

## 2021-11-15 VITALS — BP 116/70 | Temp 98.3°F

## 2021-11-15 DIAGNOSIS — Z23 Encounter for immunization: Secondary | ICD-10-CM | POA: Diagnosis not present

## 2021-11-15 NOTE — Patient Instructions (Addendum)
Zoster Vaccine Injection What is this medication? ZOSTER VACCINE (ZOS ter vak SEEN) reduces the risk of herpes zoster (shingles). It does not treat shingles. It is still possible to get shingles after receiving the vaccine, but the symptoms may be less severe or not last as long. It works by helping your immune system learn how to fight off a future infection. This medicine may be used for other purposes; ask your health care provider or pharmacist if you have questions. COMMON BRAND NAME(S): SHINGRIX What should I tell my care team before I take this medication? They need to know if you have any of these conditions: Cancer Immune system problems An unusual or allergic reaction to Zoster vaccine, other medications, foods, dyes, or preservatives Pregnant or trying to get pregnant Breastfeeding How should I use this medication? This vaccine is injected into a muscle. It is given by your care team. This vaccine requires 2 doses to get the full benefit. Set a reminder for when your next dose is due. A copy of Vaccine Information Statements will be given before each vaccination. Be sure to read this information carefully each time. This sheet may change often. Talk to your care team about the use of this vaccine in children. This vaccine is not approved for use in children. Overdosage: If you think you have taken too much of this medicine contact a poison control center or emergency room at once. NOTE: This medicine is only for you. Do not share this medicine with others. What if I miss a dose? Keep appointments for follow-up (booster) doses. It is important not to miss your dose. Call your care team if you are unable to keep an appointment. What may interact with this medication? Medications that suppress your immune system Medications to treat cancer Steroid medications, such as prednisone or cortisone This list may not describe all possible interactions. Give your health care provider a list  of all the medicines, herbs, non-prescription drugs, or dietary supplements you use. Also tell them if you smoke, drink alcohol, or use illegal drugs. Some items may interact with your medicine. What should I watch for while using this medication? Visit your care team regularly. This vaccine, like all vaccines, may not fully protect everyone. What side effects may I notice from receiving this medication? Side effects that you should report to your care team as soon as possible: Allergic reactions--skin rash, itching, hives, swelling of the face, lips, tongue, or throat Side effects that usually do not require medical attention (report these to your care team if they continue or are bothersome): Chills Fatigue Feeling faint or lightheaded Fever Headache Muscle pain Pain, redness, or irritation at injection site This list may not describe all possible side effects. Call your doctor for medical advice about side effects. You may report side effects to FDA at 1-800-FDA-1088. Where should I keep my medication? This vaccine is only given by your care team. It will not be stored at home. NOTE: This sheet is a summary. It may not cover all possible information. If you have questions about this medicine, talk to your doctor, pharmacist, or health care provider.  2023 Elsevier/Gold Standard (2021-06-01 00:00:00)  

## 2021-11-15 NOTE — Progress Notes (Signed)
Patient presents today shingles vaccine.

## 2021-12-09 ENCOUNTER — Ambulatory Visit (INDEPENDENT_AMBULATORY_CARE_PROVIDER_SITE_OTHER): Payer: 59 | Admitting: Obstetrics and Gynecology

## 2021-12-09 ENCOUNTER — Encounter: Payer: Self-pay | Admitting: Obstetrics and Gynecology

## 2021-12-09 VITALS — BP 137/98 | HR 98

## 2021-12-09 DIAGNOSIS — N393 Stress incontinence (female) (male): Secondary | ICD-10-CM

## 2021-12-09 DIAGNOSIS — R350 Frequency of micturition: Secondary | ICD-10-CM

## 2021-12-09 LAB — POCT URINALYSIS DIPSTICK
Bilirubin, UA: NEGATIVE
Blood, UA: NEGATIVE
Glucose, UA: NEGATIVE
Ketones, UA: NEGATIVE
Leukocytes, UA: NEGATIVE
Nitrite, UA: NEGATIVE
Protein, UA: NEGATIVE
Spec Grav, UA: 1.015 (ref 1.010–1.025)
Urobilinogen, UA: 0.2 E.U./dL
pH, UA: 7 (ref 5.0–8.0)

## 2021-12-09 NOTE — Progress Notes (Signed)
[facility] Return Visit  SUBJECTIVE  History of Present Illness: Karen Cameron is a 56 y.o. female seen in follow-up after urethral bulking (Bulkamid) on 11/08/21.   In the beginning of the day, she has good improvement in her symptoms, but notices more leakage near the end of the day, almost back to normal. Denies bladder urgency. Has a little bit of "tingling" with urination, not sure if it is a UTI.   Past Medical History: Patient  has a past medical history of Hypertension, Nausea & vomiting, and SAB (spontaneous abortion).   Past Surgical History: She  has a past surgical history that includes Cholecystectomy; Tubal ligation; and Dilation and curettage of uterus.   Medications: She has a current medication list which includes the following prescription(s): diazepam, hydrochlorothiazide, levocetirizine, meclizine, ondansetron, and sulfamethoxazole-trimethoprim, and the following Facility-Administered Medications: lidocaine and lidocaine.   Allergies: Patient is allergic to cat hair extract, dust mite extract, and other.   Social History: Patient  reports that she has never smoked. She has never used smokeless tobacco. She reports current alcohol use. She reports that she does not use drugs.      OBJECTIVE     Physical Exam: Vitals:   12/09/21 1547 12/09/21 1548  BP: (!) 138/99 (!) 137/98  Pulse: (!) 102 98   Gen: No apparent distress, A&O x 3.  Detailed Urogynecologic Evaluation:  Deferred.    POC urine: negative  ASSESSMENT AND PLAN    Karen Cameron is a 56 y.o. with:  1. SUI (stress urinary incontinence, female)    - Discussed expectant management vs. Repeat urethral bulking vs sling. She would like to try a second injection of the urethral bulking.  - We discussed we are looking for an overall success rate of approximately 70-80%  Will call her to schedule procedure.   Marguerita Beards, MD

## 2022-01-09 ENCOUNTER — Other Ambulatory Visit: Payer: Self-pay | Admitting: Nurse Practitioner

## 2022-01-09 ENCOUNTER — Other Ambulatory Visit: Payer: Self-pay | Admitting: Family

## 2022-01-09 DIAGNOSIS — J301 Allergic rhinitis due to pollen: Secondary | ICD-10-CM

## 2022-01-09 DIAGNOSIS — I10 Essential (primary) hypertension: Secondary | ICD-10-CM

## 2022-01-09 DIAGNOSIS — H8110 Benign paroxysmal vertigo, unspecified ear: Secondary | ICD-10-CM

## 2022-01-09 MED ORDER — HYDROCHLOROTHIAZIDE 12.5 MG PO TABS
12.5000 mg | ORAL_TABLET | Freq: Every day | ORAL | 1 refills | Status: DC
  Filled 2022-01-09 – 2022-01-10 (×2): qty 90, 90d supply, fill #0
  Filled 2022-04-07: qty 90, 90d supply, fill #1

## 2022-01-10 ENCOUNTER — Other Ambulatory Visit (HOSPITAL_COMMUNITY): Payer: Self-pay

## 2022-01-10 ENCOUNTER — Other Ambulatory Visit: Payer: Self-pay

## 2022-01-30 NOTE — Progress Notes (Unsigned)
[employer] provider portal was contacted through [employer] check CPT codes for procedure : Urethral Bulking CPT code: 74142 and [phone]  NO PA needed for this procedure. Transaction ID Ref #: [id number]  Submission has been scanned

## 2022-02-14 ENCOUNTER — Encounter: Payer: Self-pay | Admitting: Obstetrics and Gynecology

## 2022-02-14 ENCOUNTER — Other Ambulatory Visit (HOSPITAL_COMMUNITY): Payer: Self-pay

## 2022-02-14 DIAGNOSIS — N393 Stress incontinence (female) (male): Secondary | ICD-10-CM

## 2022-02-14 MED ORDER — SULFAMETHOXAZOLE-TRIMETHOPRIM 800-160 MG PO TABS
ORAL_TABLET | ORAL | 0 refills | Status: DC
  Filled 2022-02-14: qty 2, 1d supply, fill #0

## 2022-02-14 MED ORDER — DIAZEPAM 5 MG PO TABS
ORAL_TABLET | ORAL | 0 refills | Status: DC
  Filled 2022-02-14: qty 1, 1d supply, fill #0

## 2022-02-15 ENCOUNTER — Other Ambulatory Visit (HOSPITAL_COMMUNITY): Payer: Self-pay

## 2022-02-16 ENCOUNTER — Encounter: Payer: Self-pay | Admitting: Obstetrics and Gynecology

## 2022-02-16 ENCOUNTER — Ambulatory Visit: Payer: Commercial Managed Care - PPO | Admitting: Obstetrics and Gynecology

## 2022-02-16 VITALS — BP 119/88 | HR 99

## 2022-02-16 DIAGNOSIS — R350 Frequency of micturition: Secondary | ICD-10-CM

## 2022-02-16 DIAGNOSIS — N393 Stress incontinence (female) (male): Secondary | ICD-10-CM

## 2022-02-16 LAB — POCT URINALYSIS DIPSTICK
Bilirubin, UA: NEGATIVE
Glucose, UA: NEGATIVE
Ketones, UA: NEGATIVE
Leukocytes, UA: NEGATIVE
Nitrite, UA: NEGATIVE
Protein, UA: NEGATIVE
Spec Grav, UA: 1.02 (ref 1.010–1.025)
Urobilinogen, UA: 0.2 E.U./dL
pH, UA: 7 (ref 5.0–8.0)

## 2022-02-16 MED ORDER — LIDOCAINE HCL URETHRAL/MUCOSAL 2 % EX GEL
1.0000 | Freq: Once | CUTANEOUS | Status: AC
  Administered 2022-02-16: 1 via URETHRAL

## 2022-02-16 MED ORDER — LIDOCAINE-EPINEPHRINE 1 %-1:100000 IJ SOLN
6.0000 mL | Freq: Once | INTRAMUSCULAR | Status: AC
  Administered 2022-02-16: 6 mL

## 2022-02-16 NOTE — Patient Instructions (Signed)
Taking Care of Yourself after Urodynamics, Cystoscopy, Bulkamid Injection, or Botox Injection   Drink plenty of water for a day or two following your procedure. Try to have about 8 ounces (one cup) at a time, and do this 6 times or more per day unless you have fluid restrictitons AVOID irritative beverages such as coffee, tea, soda, alcoholic or citrus drinks for a day or two, as this may cause burning with urination.  For the first 1-2 days after the procedure, your urine may be pink or red in color. You may have some blood in your urine as a normal side effect of the procedure. Large amounts of bleeding or difficulty urinating are NOT normal. Call the nurse line if this happens or go to the nearest Emergency Room if the bleeding is heavy or you cannot urinate at all and it is after hours. If you had a Bulkamid injection in the urethra and need to be catheterized, ask for a pediatric catheter to be used (size 10 or 12-French) so the material is not pushed out of place.   You may experience some discomfort or a burning sensation with urination after having this procedure. You can use over the counter Azo or pyridium to help with burning and follow the instructions on the packaging. If it does not improve within 1-2 days, or other symptoms appear (fever, chills, or difficulty urinating) call the office to speak to a nurse.  You may return to normal daily activities such as work, school, driving, exercising and housework on the day of the procedure. If your doctor gave you a prescription, take it as ordered.     

## 2022-02-16 NOTE — Progress Notes (Signed)
Bulkamid Injection  CC: 57 y.o. y.o. F with stress incontinence who presents for transurethral Bulkamid injection. This is her second injection. Previously done on 11/08/21.   Patient signed her consent form.  She started antibiotic prophylaxis today.  Procedure: Time out was performed. 10 ml of 2% lidocaine jelly placed in the urethra. A urethral block was performed by injecting 22m of 1% lidocaine with epinephrine at 3 and 6 o'clock adjacent to the urethra.  The needle was primed.  The cystoscope was inserted to the level of the bladder neck.  The needle was inserted 2 cm and the scope was pulled back into the urethra 2 cm.  The needle was inserted bevel up at the 5 o'clock position and the Bulkamid was injected to obtain coaptation.  This was repeated at the 2 o'clock,  10 o'clock and 7 o'clock positions.   A total of 2- 175msyringes were used and good circumferential coaptation was noted.  The patient tolerated the procedure well. She was asked to void after the procedure.  Post-Void Residual (PVR) by Bladder Scan: In order to evaluate bladder emptying, we discussed obtaining a postvoid residual and she agreed to this procedure.  Procedure: The ultrasound unit was placed on the patient's abdomen in the suprapubic region after the patient had voided. A PVR of 0 ml was obtained by bladder scan.  ASSESSMENT: [age].o. y.o. s/p transurethral Bulkamid injection for stress incontinence.   PLAN: Patient will follow up in 4 weeks to reassess. Voiding and post-procedure precautions were given. She will return for heavy bleeding, fevers, dysuria lasting beyond today and incomplete emptying.  All questions were answered.  MiJaquita FoldsMD

## 2022-02-20 ENCOUNTER — Encounter: Payer: Self-pay | Admitting: *Deleted

## 2022-02-21 ENCOUNTER — Other Ambulatory Visit (HOSPITAL_COMMUNITY): Payer: Self-pay

## 2022-03-01 DIAGNOSIS — H524 Presbyopia: Secondary | ICD-10-CM | POA: Diagnosis not present

## 2022-03-17 ENCOUNTER — Ambulatory Visit: Payer: Commercial Managed Care - PPO | Admitting: Obstetrics and Gynecology

## 2022-03-17 ENCOUNTER — Encounter: Payer: Self-pay | Admitting: Obstetrics and Gynecology

## 2022-03-17 VITALS — BP 131/95 | HR 96

## 2022-03-17 DIAGNOSIS — N393 Stress incontinence (female) (male): Secondary | ICD-10-CM

## 2022-03-17 NOTE — Progress Notes (Signed)
[facility] Return Visit  SUBJECTIVE  History of Present Illness: Karen Cameron is a 57 y.o. female seen in follow-up for SUI. She had a her second bulkamid injection on 02/16/22.   Is not having any leakage. Denies issues with emptying or dysuria.  Only has a little urgency if she waits too long. Overall she is very happy with the results of the bulkamid.   Past Medical History: Patient  has a past medical history of Hypertension, Nausea & vomiting, and SAB (spontaneous abortion).   Past Surgical History: She  has a past surgical history that includes Cholecystectomy; Tubal ligation; and Dilation and curettage of uterus.   Medications: She has a current medication list which includes the following prescription(s): diazepam, hydrochlorothiazide, levocetirizine, meclizine, sulfamethoxazole-trimethoprim, and ondansetron, and the following Facility-Administered Medications: lidocaine and lidocaine.   Allergies: Patient is allergic to cat hair extract, dust mite extract, and other.   Social History: Patient  reports that she has never smoked. She has never used smokeless tobacco. She reports current alcohol use. She reports that she does not use drugs.      OBJECTIVE     Physical Exam: Vitals:   03/17/22 0802  BP: (!) 131/95  Pulse: 96   Gen: No apparent distress, A&O x 3.  Detailed Urogynecologic Evaluation:  Deferred.    ASSESSMENT AND PLAN    Karen Cameron is a 57 y.o. with:  1. SUI (stress urinary incontinence, female)    - Has had good results after two rounds of Bulkamid. No longer leaking.  - We discussed that results should last several years. If symptoms return, will plan for repeat bulkamid or sling in the future.   Return as needed  Jaquita Folds, MD  Time spent: I spent 10 minutes dedicated to the care of this patient on the date of this encounter to include pre-visit review of records, face-to-face time with the patient  and post visit  documentation.

## 2022-04-07 ENCOUNTER — Other Ambulatory Visit (HOSPITAL_COMMUNITY): Payer: Self-pay

## 2022-04-12 ENCOUNTER — Other Ambulatory Visit (HOSPITAL_COMMUNITY): Payer: Self-pay

## 2022-04-12 DIAGNOSIS — Z01419 Encounter for gynecological examination (general) (routine) without abnormal findings: Secondary | ICD-10-CM | POA: Diagnosis not present

## 2022-04-12 DIAGNOSIS — Z6824 Body mass index (BMI) 24.0-24.9, adult: Secondary | ICD-10-CM | POA: Diagnosis not present

## 2022-04-12 DIAGNOSIS — Z1211 Encounter for screening for malignant neoplasm of colon: Secondary | ICD-10-CM | POA: Diagnosis not present

## 2022-04-12 DIAGNOSIS — Z1231 Encounter for screening mammogram for malignant neoplasm of breast: Secondary | ICD-10-CM | POA: Diagnosis not present

## 2022-04-12 DIAGNOSIS — Z124 Encounter for screening for malignant neoplasm of cervix: Secondary | ICD-10-CM | POA: Diagnosis not present

## 2022-04-12 DIAGNOSIS — B009 Herpesviral infection, unspecified: Secondary | ICD-10-CM | POA: Diagnosis not present

## 2022-04-12 DIAGNOSIS — N951 Menopausal and female climacteric states: Secondary | ICD-10-CM | POA: Diagnosis not present

## 2022-04-12 LAB — HM MAMMOGRAPHY

## 2022-04-12 MED ORDER — VALACYCLOVIR HCL 500 MG PO TABS
500.0000 mg | ORAL_TABLET | Freq: Two times a day (BID) | ORAL | 3 refills | Status: DC
  Filled 2022-04-12: qty 30, 15d supply, fill #0
  Filled 2022-09-07: qty 30, 15d supply, fill #1

## 2022-05-15 NOTE — Progress Notes (Signed)
Hershal Coria Martin,acting as a Neurosurgeon for Arnette Felts, FNP.,have documented all relevant documentation on the behalf of Arnette Felts, FNP,as directed by  Arnette Felts, FNP while in the presence of Arnette Felts, FNP.   Subjective:     Patient ID: Karen Cameron , female    DOB: 11/05/1965 , 57 y.o.   MRN: [id number]   Chief Complaint  Patient presents with   Annual Exam    HPI  Patient presents today for HM, patient states compliance with medications and has no other concerns today. Patient reports she is having anxiety due to her son. Patient admits to not taking BP medications. She will wait until the end of the summer to see if she does not have a menstrual cycle. She is having hot flashes. She sees [facility].  She had a urethral bulking done earlier this year and is doing well.   BP Readings from Last 3 Encounters: 05/16/22 : (!) 120/100 03/17/22 : (!) 131/95 02/16/22 : 119/88  Wt Readings from Last 3 Encounters: 05/16/22 : 135 lb (61.2 kg) 11/08/21 : 129 lb 9.6 oz (58.8 kg) 10/10/21 : 134 lb (60.8 kg)     Past Medical History:  Diagnosis Date   Allergy    Hypertension    Nausea & vomiting    SAB (spontaneous abortion)    X2     Family History  Problem Relation Age of Onset   Diabetes Mother    Cancer Father        HEAD,NECK, THROAT   Diabetes Maternal Aunt    Diabetes Maternal Uncle    Diabetes Paternal Grandmother    Allergic rhinitis Neg Hx    Asthma Neg Hx    Eczema Neg Hx    Urticaria Neg Hx      Current Outpatient Medications:    hydrOXYzine (ATARAX) 10 MG tablet, Take 1 tablet (10 mg total) by mouth 3 (three) times daily as needed., Disp: 30 tablet, Rfl: 0   levocetirizine (XYZAL) 5 MG tablet, Take 1 tablet (5 mg total) by mouth every evening., Disp: 90 tablet, Rfl: 2   Magnesium Glycinate 665 MG CAPS, Take 1 capsule by mouth daily., Disp: 90 capsule, Rfl: 1   meclizine (ANTIVERT) 25 MG tablet, Take 1 tablet (25 mg total) by mouth 3 (three)  times daily as needed for dizziness., Disp: 90 tablet, Rfl: 1   sulfamethoxazole-trimethoprim (BACTRIM DS) 800-160 MG tablet, Take one tablet prior to procedure and second 12 hours later, Disp: 2 tablet, Rfl: 0   valACYclovir (VALTREX) 500 MG tablet, Take 1 tablet (500 mg total) by mouth 2 (two) times daily for 5 days as needed, Disp: 30 tablet, Rfl: 3   hydrochlorothiazide (HYDRODIURIL) 12.5 MG tablet, Take 1 tablet (12.5 mg total) by mouth daily., Disp: 90 tablet, Rfl: 1   ondansetron (ZOFRAN) 4 MG tablet, Take 1 tablet (4 mg total) by mouth every 8 (eight) hours as needed for nausea or vomiting. (Patient not taking: Reported on 11/03/2021), Disp: 20 tablet, Rfl: 0  Current Facility-Administered Medications:    lidocaine (XYLOCAINE) 2 % (with pres) injection 60 mg, 3 mL, Other, Once, Florian Buff, Rochel Brome, MD   lidocaine (XYLOCAINE) 2 % jelly 1 Application, 1 Application, Urethral, Once, Marguerita Beards, MD   Allergies  Allergen Reactions   Cat Hair Extract    Dust Mite Extract    Other       The patient states she uses tubal ligation for birth control.  No LMP recorded (  lmp unknown). (Menstrual status: Irregular Periods).. Negative for Dysmenorrhea and Negative for Menorrhagia. Negative for: breast discharge, breast lump(s), breast pain and breast self exam. Associated symptoms include abnormal vaginal bleeding. Pertinent negatives include abnormal bleeding (hematology), anxiety, decreased libido, depression, difficulty falling sleep, dyspareunia, history of infertility, nocturia, sexual dysfunction, sleep disturbances, urinary incontinence, urinary urgency, vaginal discharge and vaginal itching. Diet - she is doing keto and pescatarian but will eat chicken wings and bacon. The patient states her exercise level is minimal but she plans to start tennis class and yoga today.   The patient's tobacco use is:  Social History   Tobacco Use  Smoking Status Never  Smokeless Tobacco Never    She has been exposed to passive smoke. The patient's alcohol use is:  Social History   Substance and Sexual Activity  Alcohol Use Yes   Comment: Socially  . Additional information: Last pap 2023 but the patient reports she had one done this year.    Review of Systems  Constitutional: Negative.   Respiratory: Negative.    Cardiovascular: Negative.   Neurological: Negative.   Psychiatric/Behavioral: Negative.       Today's Vitals   05/16/22 0909 05/16/22 1034  BP: (!) 120/100 132/68  Pulse: 77   Temp: 98.4 F (36.9 C)   TempSrc: Oral   Weight: 135 lb (61.2 kg)   Height: 5\' 1"  (1.549 m)   PainSc: 0-No pain    Body mass index is 25.51 kg/m.  Wt Readings from Last 3 Encounters:  05/16/22 135 lb (61.2 kg)  11/08/21 129 lb 9.6 oz (58.8 kg)  10/10/21 134 lb (60.8 kg)    Objective:  Physical Exam Vitals reviewed.  Constitutional:      General: She is not in acute distress.    Appearance: Normal appearance. She is well-developed.  HENT:     Head: Normocephalic and atraumatic.     Right Ear: Hearing, tympanic membrane, ear canal and external ear normal. There is no impacted cerumen.     Left Ear: Hearing, tympanic membrane, ear canal and external ear normal. There is impacted cerumen.     Nose: Nose normal.     Mouth/Throat:     Mouth: Mucous membranes are moist.  Eyes:     General: Lids are normal.     Extraocular Movements: Extraocular movements intact.     Conjunctiva/sclera: Conjunctivae normal.     Pupils: Pupils are equal, round, and reactive to light.     Funduscopic exam:    Right eye: No papilledema.        Left eye: No papilledema.  Neck:     Thyroid: No thyroid mass.     Vascular: No carotid bruit.  Cardiovascular:     Rate and Rhythm: Normal rate and regular rhythm.     Pulses: Normal pulses.     Heart sounds: Normal heart sounds. No murmur heard. Pulmonary:     Effort: Pulmonary effort is normal. No respiratory distress.     Breath sounds: Normal  breath sounds. No wheezing.  Chest:     Chest wall: No mass.  Breasts:    Tanner Score is 5.     Right: Normal. No mass or tenderness.     Left: Normal. No mass or tenderness.  Abdominal:     General: Abdomen is flat. Bowel sounds are normal. There is no distension.     Palpations: Abdomen is soft.     Tenderness: There is no abdominal tenderness.  Musculoskeletal:  General: No swelling. Normal range of motion.     Cervical back: Full passive range of motion without pain, normal range of motion and neck supple.     Right lower leg: No edema.     Left lower leg: No edema.  Lymphadenopathy:     Upper Body:     Right upper body: No supraclavicular, axillary or pectoral adenopathy.     Left upper body: No supraclavicular, axillary or pectoral adenopathy.  Skin:    General: Skin is warm and dry.     Capillary Refill: Capillary refill takes less than 2 seconds.  Neurological:     General: No focal deficit present.     Mental Status: She is alert and oriented to person, place, and time.     Cranial Nerves: No cranial nerve deficit.     Sensory: No sensory deficit.  Psychiatric:        Mood and Affect: Mood normal.        Behavior: Behavior normal.        Thought Content: Thought content normal.        Judgment: Judgment normal.         Assessment And Plan:     1. Encounter for annual physical exam Behavior modifications discussed and diet history reviewed.   Pt will continue to exercise regularly and modify diet with low GI, plant based foods and decrease intake of processed foods.  Recommend intake of daily multivitamin, Vitamin D, and calcium.  Recommend mammogram and colonoscopy for preventive screenings, as well as recommend immunizations that include influenza, TDAP, and shingles.  Health Maintenance  Topic Date Due   COVID-19 Vaccine (4 - 2023-24 season) 09/02/2021   Mammogram  03/13/2022   Flu Shot  08/03/2022   Cologuard (Stool DNA test)  03/25/2023   Pap  Smear  03/14/2024   DTaP/Tdap/Td vaccine (2 - Td or Tdap) 05/06/2031   Hepatitis C Screening  Completed   HIV Screening  Completed   Zoster (Shingles) Vaccine  Completed   HPV Vaccine  Aged Out   Colon Cancer Screening  Discontinued    2. Essential hypertension Comments: Blood pressure is elevated, repeat is improved, discussed importance of medication adherence. EKG done NSR HR 77 - EKG 12-Lead - POCT URINALYSIS DIP (CLINITEK) - Microalbumin / creatinine urine ratio - CMP14+EGFR - hydrochlorothiazide (HYDRODIURIL) 12.5 MG tablet; Take 1 tablet (12.5 mg total) by mouth daily.  Dispense: 90 tablet; Refill: 1  3. Elevated LDL cholesterol level Comments: Cholesterol levels are stable. Continue focusing on diet low in fat. - Lipid panel  4. Impacted cerumen of left ear Comments: I did not flush her ear or use curette.  5. Situational mixed anxiety and depressive disorder Comments: Depression score 5 and anxiety score 16. She will start magnesium daily and has atarax as needed. Encouraged to use her EAP counselor. - Magnesium Glycinate 665 MG CAPS; Take 1 capsule by mouth daily.  Dispense: 90 capsule; Refill: 1 - hydrOXYzine (ATARAX) 10 MG tablet; Take 1 tablet (10 mg total) by mouth 3 (three) times daily as needed.  Dispense: 30 tablet; Refill: 0  6. Need for zoster vaccination Comments: 2nd shingrix in office - Varicella-zoster vaccine IM  7. BMI 25.0-25.9,adult  8. Other long term (current) drug therapy - CBC with Differential/Platelet    Return for 1 year physical, 4 month bp check; 2 week [place] BP check.  Patient was given opportunity to ask questions. Patient verbalized understanding of the plan and was able  to repeat key elements of the plan. All questions were answered to their satisfaction.   Arnette Felts, FNP   I, Arnette Felts, FNP, have reviewed all documentation for this visit. The documentation on 05/16/22 for the exam, diagnosis, procedures, and orders are all  accurate and complete.   THE PATIENT IS ENCOURAGED TO PRACTICE SOCIAL DISTANCING DUE TO THE COVID-19 PANDEMIC.

## 2022-05-16 ENCOUNTER — Other Ambulatory Visit (HOSPITAL_COMMUNITY): Payer: Self-pay

## 2022-05-16 ENCOUNTER — Encounter: Payer: Self-pay | Admitting: Nurse Practitioner

## 2022-05-16 ENCOUNTER — Ambulatory Visit (INDEPENDENT_AMBULATORY_CARE_PROVIDER_SITE_OTHER): Payer: Commercial Managed Care - PPO | Admitting: Nurse Practitioner

## 2022-05-16 VITALS — BP 132/68 | HR 77 | Temp 98.4°F | Ht 61.0 in | Wt 135.0 lb

## 2022-05-16 DIAGNOSIS — F4323 Adjustment disorder with mixed anxiety and depressed mood: Secondary | ICD-10-CM | POA: Diagnosis not present

## 2022-05-16 DIAGNOSIS — Z79899 Other long term (current) drug therapy: Secondary | ICD-10-CM

## 2022-05-16 DIAGNOSIS — E7800 Pure hypercholesterolemia, unspecified: Secondary | ICD-10-CM | POA: Diagnosis not present

## 2022-05-16 DIAGNOSIS — H6122 Impacted cerumen, left ear: Secondary | ICD-10-CM

## 2022-05-16 DIAGNOSIS — Z0000 Encounter for general adult medical examination without abnormal findings: Secondary | ICD-10-CM | POA: Diagnosis not present

## 2022-05-16 DIAGNOSIS — I10 Essential (primary) hypertension: Secondary | ICD-10-CM

## 2022-05-16 DIAGNOSIS — Z23 Encounter for immunization: Secondary | ICD-10-CM | POA: Diagnosis not present

## 2022-05-16 DIAGNOSIS — Z6825 Body mass index (BMI) 25.0-25.9, adult: Secondary | ICD-10-CM | POA: Diagnosis not present

## 2022-05-16 LAB — POCT URINALYSIS DIP (CLINITEK)
Bilirubin, UA: NEGATIVE
Glucose, UA: NEGATIVE mg/dL
Ketones, POC UA: NEGATIVE mg/dL
Leukocytes, UA: NEGATIVE
Nitrite, UA: NEGATIVE
POC PROTEIN,UA: NEGATIVE
Spec Grav, UA: 1.025 (ref 1.010–1.025)
Urobilinogen, UA: 1 E.U./dL
pH, UA: 7 (ref 5.0–8.0)

## 2022-05-16 MED ORDER — HYDROXYZINE HCL 10 MG PO TABS
10.0000 mg | ORAL_TABLET | Freq: Three times a day (TID) | ORAL | 0 refills | Status: DC | PRN
Start: 2022-05-16 — End: 2022-09-18
  Filled 2022-05-16: qty 30, 10d supply, fill #0

## 2022-05-16 MED ORDER — HYDROCHLOROTHIAZIDE 12.5 MG PO TABS
12.5000 mg | ORAL_TABLET | Freq: Every day | ORAL | 1 refills | Status: DC
Start: 2022-05-16 — End: 2022-06-06
  Filled 2022-05-16: qty 90, 90d supply, fill #0

## 2022-05-16 MED ORDER — MAGNESIUM GLYCINATE 665 MG PO CAPS
1.0000 | ORAL_CAPSULE | Freq: Every day | ORAL | 1 refills | Status: DC
Start: 2022-05-16 — End: 2022-09-18
  Filled 2022-05-16: qty 90, fill #0

## 2022-05-16 NOTE — Patient Instructions (Addendum)
You can take magnesium daily to help with anxiety Also take atarax as needed for anxiety you can take when you are at home to make sure you do not get sleepy

## 2022-05-17 LAB — CBC WITH DIFFERENTIAL/PLATELET
Basophils Absolute: 0 10*3/uL (ref 0.0–0.2)
Basos: 1 %
EOS (ABSOLUTE): 0.1 10*3/uL (ref 0.0–0.4)
Eos: 1 %
Hematocrit: 42.7 % (ref 34.0–46.6)
Hemoglobin: 13.9 g/dL (ref 11.1–15.9)
Immature Grans (Abs): 0 10*3/uL (ref 0.0–0.1)
Immature Granulocytes: 0 %
Lymphocytes Absolute: 2.1 10*3/uL (ref 0.7–3.1)
Lymphs: 40 %
MCH: 29.3 pg (ref 26.6–33.0)
MCHC: 32.6 g/dL (ref 31.5–35.7)
MCV: 90 fL (ref 79–97)
Monocytes Absolute: 0.4 10*3/uL (ref 0.1–0.9)
Monocytes: 8 %
Neutrophils Absolute: 2.6 10*3/uL (ref 1.4–7.0)
Neutrophils: 50 %
Platelets: 343 10*3/uL (ref 150–450)
RBC: 4.75 x10E6/uL (ref 3.77–5.28)
RDW: 12 % (ref 11.7–15.4)
WBC: 5.2 10*3/uL (ref 3.4–10.8)

## 2022-05-17 LAB — CMP14+EGFR
ALT: 15 IU/L (ref 0–32)
AST: 26 IU/L (ref 0–40)
Albumin/Globulin Ratio: 1.7 (ref 1.2–2.2)
Albumin: 4.9 g/dL (ref 3.8–4.9)
Alkaline Phosphatase: 88 IU/L (ref 44–121)
BUN/Creatinine Ratio: 10 (ref 9–23)
BUN: 10 mg/dL (ref 6–24)
Bilirubin Total: 0.7 mg/dL (ref 0.0–1.2)
CO2: 24 mmol/L (ref 20–29)
Calcium: 10 mg/dL (ref 8.7–10.2)
Chloride: 92 mmol/L — ABNORMAL LOW (ref 96–106)
Creatinine, Ser: 1.05 mg/dL — ABNORMAL HIGH (ref 0.57–1.00)
Globulin, Total: 2.9 g/dL (ref 1.5–4.5)
Glucose: 78 mg/dL (ref 70–99)
Potassium: 4.6 mmol/L (ref 3.5–5.2)
Sodium: 132 mmol/L — ABNORMAL LOW (ref 134–144)
Total Protein: 7.8 g/dL (ref 6.0–8.5)
eGFR: 63 mL/min/{1.73_m2} (ref >59)

## 2022-05-17 LAB — LIPID PANEL
Chol/HDL Ratio: 2.6 ratio (ref 0.0–4.4)
Cholesterol, Total: 240 mg/dL — ABNORMAL HIGH (ref 100–199)
HDL: 93 mg/dL (ref >39)
LDL Chol Calc (NIH): 139 mg/dL — ABNORMAL HIGH (ref 0–99)
Triglycerides: 48 mg/dL (ref 0–149)
VLDL Cholesterol Cal: 8 mg/dL (ref 5–40)

## 2022-05-17 LAB — MICROALBUMIN / CREATININE URINE RATIO
Creatinine, Urine: 82.2 mg/dL
Microalb/Creat Ratio: 4 mg/g creat (ref 0–29)
Microalbumin, Urine: 3.1 ug/mL

## 2022-06-06 ENCOUNTER — Ambulatory Visit: Payer: Commercial Managed Care - PPO

## 2022-06-06 ENCOUNTER — Other Ambulatory Visit (HOSPITAL_COMMUNITY): Payer: Self-pay

## 2022-06-06 VITALS — BP 132/80 | HR 83 | Temp 98.5°F | Ht 61.0 in | Wt 135.0 lb

## 2022-06-06 DIAGNOSIS — I10 Essential (primary) hypertension: Secondary | ICD-10-CM

## 2022-06-06 MED ORDER — HYDROCHLOROTHIAZIDE 25 MG PO TABS
25.0000 mg | ORAL_TABLET | Freq: Every day | ORAL | 2 refills | Status: DC
Start: 2022-06-06 — End: 2023-03-07
  Filled 2022-06-06: qty 90, 90d supply, fill #0
  Filled 2022-08-30: qty 90, 90d supply, fill #1
  Filled 2022-12-08: qty 90, 90d supply, fill #2

## 2022-06-06 NOTE — Patient Instructions (Signed)
Hypertension, Adult Hypertension is another name for high blood pressure. High blood pressure forces your heart to work harder to pump blood. This can cause problems over time. There are two numbers in a blood pressure reading. There is a top number (systolic) over a bottom number (diastolic). It is best to have a blood pressure that is below 120/80. What are the causes? The cause of this condition is not known. Some other conditions can lead to high blood pressure. What increases the risk? Some lifestyle factors can make you more likely to develop high blood pressure: Smoking. Not getting enough exercise or physical activity. Being overweight. Having too much fat, sugar, calories, or salt (sodium) in your diet. Drinking too much alcohol. Other risk factors include: Having any of these conditions: Heart disease. Diabetes. High cholesterol. Kidney disease. Obstructive sleep apnea. Having a family history of high blood pressure and high cholesterol. Age. The risk increases with age. Stress. What are the signs or symptoms? High blood pressure may not cause symptoms. Very high blood pressure (hypertensive crisis) may cause: Headache. Fast or uneven heartbeats (palpitations). Shortness of breath. Nosebleed. Vomiting or feeling like you may vomit (nauseous). Changes in how you see. Very bad chest pain. Feeling dizzy. Seizures. How is this treated? This condition is treated by making healthy lifestyle changes, such as: Eating healthy foods. Exercising more. Drinking less alcohol. Your doctor may prescribe medicine if lifestyle changes do not help enough and if: Your top number is above 130. Your bottom number is above 80. Your personal target blood pressure may vary. Follow these instructions at home: Eating and drinking  If told, follow the DASH eating plan. To follow this plan: Fill one half of your plate at each meal with fruits and vegetables. Fill one fourth of your plate  at each meal with whole grains. Whole grains include whole-wheat pasta, brown rice, and whole-grain bread. Eat or drink low-fat dairy products, such as skim milk or low-fat yogurt. Fill one fourth of your plate at each meal with low-fat (lean) proteins. Low-fat proteins include fish, chicken without skin, eggs, beans, and tofu. Avoid fatty meat, cured and processed meat, or chicken with skin. Avoid pre-made or processed food. Limit the amount of salt in your diet to less than 1,500 mg each day. Do not drink alcohol if: Your doctor tells you not to drink. You are pregnant, may be pregnant, or are planning to become pregnant. If you drink alcohol: Limit how much you have to: 0-1 drink a day for women. 0-2 drinks a day for men. Know how much alcohol is in your drink. In the [place] one drink equals one 12 oz bottle of beer (355 mL), one 5 oz glass of wine (148 mL), or one 1 oz glass of hard liquor (44 mL). Lifestyle  Work with your doctor to stay at a healthy weight or to lose weight. Ask your doctor what the best weight is for you. Get at least 30 minutes of exercise that causes your heart to beat faster (aerobic exercise) most days of the week. This may include walking, swimming, or biking. Get at least 30 minutes of exercise that strengthens your muscles (resistance exercise) at least 3 days a week. This may include lifting weights or doing Pilates. Do not smoke or use any products that contain nicotine or tobacco. If you need help quitting, ask your doctor. Check your blood pressure at home as told by your doctor. Keep all follow-up visits. Medicines Take over-the-counter and prescription medicines   only as told by your doctor. Follow directions carefully. Do not skip doses of blood pressure medicine. The medicine does not work as well if you skip doses. Skipping doses also puts you at risk for problems. Ask your doctor about side effects or reactions to medicines that you should watch  for. Contact a doctor if: You think you are having a reaction to the medicine you are taking. You have headaches that keep coming back. You feel dizzy. You have swelling in your ankles. You have trouble with your vision. Get help right away if: You get a very bad headache. You start to feel mixed up (confused). You feel weak or numb. You feel faint. You have very bad pain in your: Chest. Belly (abdomen). You vomit more than once. You have trouble breathing. These symptoms may be an emergency. Get help right away. Call 911. Do not wait to see if the symptoms will go away. Do not drive yourself to the hospital. Summary Hypertension is another name for high blood pressure. High blood pressure forces your heart to work harder to pump blood. For most people, a normal blood pressure is less than 120/80. Making healthy choices can help lower blood pressure. If your blood pressure does not get lower with healthy choices, you may need to take medicine. This information is not intended to replace advice given to you by your health care provider. Make sure you discuss any questions you have with your health care provider. Document Revised: 10/07/2020 Document Reviewed: 10/07/2020 Elsevier Patient Education  2024 [employer]  

## 2022-06-06 NOTE — Progress Notes (Signed)
Patient presents today for a bp check, patient currently taking HCTZ 12.5mg  PM.  BP Readings from Last 3 Encounters:  06/06/22 136/83  05/16/22 132/68  03/17/22 (!) 131/95  Per Provider- Increase to 25 mg HCTZ and I recommend for her to take in am -come back in 2 weeks for recheck and BMP check

## 2022-06-15 ENCOUNTER — Encounter: Payer: Self-pay | Admitting: Nurse Practitioner

## 2022-06-20 ENCOUNTER — Ambulatory Visit: Payer: Self-pay

## 2022-09-06 ENCOUNTER — Other Ambulatory Visit (HOSPITAL_COMMUNITY): Payer: Self-pay

## 2022-09-18 ENCOUNTER — Ambulatory Visit: Payer: Commercial Managed Care - PPO | Admitting: Nurse Practitioner

## 2022-09-18 ENCOUNTER — Other Ambulatory Visit: Payer: Self-pay | Admitting: Nurse Practitioner

## 2022-09-18 ENCOUNTER — Encounter: Payer: Self-pay | Admitting: Nurse Practitioner

## 2022-09-18 VITALS — BP 130/80 | HR 84 | Temp 98.1°F | Ht 61.4 in | Wt 135.0 lb

## 2022-09-18 DIAGNOSIS — I10 Essential (primary) hypertension: Secondary | ICD-10-CM | POA: Diagnosis not present

## 2022-09-18 DIAGNOSIS — M67431 Ganglion, right wrist: Secondary | ICD-10-CM | POA: Insufficient documentation

## 2022-09-18 DIAGNOSIS — Z2821 Immunization not carried out because of patient refusal: Secondary | ICD-10-CM | POA: Insufficient documentation

## 2022-09-18 NOTE — Progress Notes (Signed)
Madelaine Bhat, CMA,acting as a Neurosurgeon for Arnette Felts, FNP.,have documented all relevant documentation on the behalf of Arnette Felts, FNP,as directed by  Arnette Felts, FNP while in the presence of Arnette Felts, FNP.  Subjective:  Patient ID: Karen Cameron , female    DOB: [birth year] , 57 y.o.   MRN: [id number]  Chief Complaint  Patient presents with   Hypertension    HPI  Patient presents today for a BP follow up, patient reports compliance with medications. Patient denies any chest pain, SOB, or headaches. Patient has no concerns today. She limits her salt intake. She eats a low salt diet. She is doing yoga, line dancing and pilates.    Letter sent for her mammogram     Past Medical History:  Diagnosis Date   Allergy    Hypertension    Nausea & vomiting    SAB (spontaneous abortion)    X2     Family History  Problem Relation Age of Onset   Diabetes Mother    Cancer Father        HEAD,NECK, THROAT   Diabetes Maternal Aunt    Diabetes Maternal Uncle    Diabetes Paternal Grandmother    Allergic rhinitis Neg Hx    Asthma Neg Hx    Eczema Neg Hx    Urticaria Neg Hx      Current Outpatient Medications:    hydrochlorothiazide (HYDRODIURIL) 25 MG tablet, Take 1 tablet (25 mg total) by mouth daily., Disp: 90 tablet, Rfl: 2   levocetirizine (XYZAL) 5 MG tablet, Take 1 tablet (5 mg total) by mouth every evening., Disp: 90 tablet, Rfl: 2   meclizine (ANTIVERT) 25 MG tablet, Take 1 tablet (25 mg total) by mouth 3 (three) times daily as needed for dizziness., Disp: 90 tablet, Rfl: 1   ondansetron (ZOFRAN) 4 MG tablet, Take 1 tablet (4 mg total) by mouth every 8 (eight) hours as needed for nausea or vomiting., Disp: 20 tablet, Rfl: 0   valACYclovir (VALTREX) 500 MG tablet, Take 1 tablet (500 mg total) by mouth 2 (two) times daily for 5 days as needed, Disp: 30 tablet, Rfl: 3  Current Facility-Administered Medications:    lidocaine (XYLOCAINE) 2 % (with pres) injection 60 mg,  3 mL, Other, Once, Florian Buff, Rochel Brome, MD   lidocaine (XYLOCAINE) 2 % jelly 1 Application, 1 Application, Urethral, Once, Marguerita Beards, MD   Allergies  Allergen Reactions   Cat Hair Extract    Dust Mite Extract    Other      Review of Systems  Constitutional: Negative.   HENT: Negative.    Eyes: Negative.   Respiratory: Negative.    Cardiovascular: Negative.   Gastrointestinal: Negative.   Psychiatric/Behavioral: Negative.       Today's Vitals   09/18/22 0831  BP: 130/80  Pulse: 84  Temp: 98.1 F (36.7 C)  Weight: 135 lb (61.2 kg)  Height: 5' 1.4" (1.56 m)  PainSc: 0-No pain   Body mass index is 25.18 kg/m.  Wt Readings from Last 3 Encounters:  09/18/22 135 lb (61.2 kg)  06/06/22 135 lb (61.2 kg)  05/16/22 135 lb (61.2 kg)    The 10-year ASCVD risk score (Arnett DK, et al., 2019) is: 3.3%   Values used to calculate the score:     Age: 70 years     Sex: Female     Is Non-Hispanic African American: Yes     Diabetic: No     Tobacco smoker:  No     Systolic Blood Pressure: 130 mmHg     Is BP treated: Yes     HDL Cholesterol: 93 mg/dL     Total Cholesterol: 240 mg/dL  Objective:  Physical Exam Vitals reviewed.  Constitutional:      General: She is not in acute distress.    Appearance: Normal appearance. She is well-developed.  HENT:     Head: Normocephalic and atraumatic.  Eyes:     Pupils: Pupils are equal, round, and reactive to light.  Cardiovascular:     Rate and Rhythm: Normal rate and regular rhythm.     Pulses: Normal pulses.     Heart sounds: Normal heart sounds. No murmur heard. Pulmonary:     Effort: Pulmonary effort is normal. No respiratory distress.     Breath sounds: Normal breath sounds. No wheezing.  Musculoskeletal:        General: Normal range of motion.  Skin:    General: Skin is warm and dry.     Capillary Refill: Capillary refill takes less than 2 seconds.     Comments: Right wrist with palpable ganglion cyst to right  medial wrist on radial side anteriorly  Neurological:     General: No focal deficit present.     Mental Status: She is alert and oriented to person, place, and time.     Cranial Nerves: No cranial nerve deficit.     Motor: No weakness.  Psychiatric:        Mood and Affect: Mood normal.         Assessment And Plan:  Essential hypertension Assessment & Plan: Blood pressure continues to improve and is on the border of elevated BP. Continue exercising regularly and increase water intake.   Orders: -     Basic metabolic panel  Ganglion cyst of wrist, right Assessment & Plan: Semi soft ganglion cyst to right radial side of wrist. Will refer to Dr .Amanda Pea at patient request.   Orders: -     Ambulatory referral to Hand Surgery  COVID-19 vaccination declined    Return in about 4 months (around 01/18/2023) for bp check.   Patient was given opportunity to ask questions. Patient verbalized understanding of the plan and was able to repeat key elements of the plan. All questions were answered to their satisfaction.    Jeanell Sparrow, FNP, have reviewed all documentation for this visit. The documentation on 09/18/22 for the exam, diagnosis, procedures, and orders are all accurate and complete.   IF YOU HAVE BEEN REFERRED TO A SPECIALIST, IT MAY TAKE 1-2 WEEKS TO SCHEDULE/PROCESS THE REFERRAL. IF YOU HAVE NOT HEARD FROM US/SPECIALIST IN TWO WEEKS, PLEASE GIVE [place] A CALL AT [phone].

## 2022-09-18 NOTE — Assessment & Plan Note (Signed)
Semi soft ganglion cyst to right radial side of wrist. Will refer to Dr .Amanda Pea at patient request.

## 2022-09-18 NOTE — Assessment & Plan Note (Signed)
Blood pressure continues to improve and is on the border of elevated BP. Continue exercising regularly and increase water intake.

## 2022-09-19 ENCOUNTER — Other Ambulatory Visit (HOSPITAL_COMMUNITY): Payer: Self-pay

## 2022-09-19 LAB — BASIC METABOLIC PANEL
BUN/Creatinine Ratio: 11 (ref 9–23)
BUN: 10 mg/dL (ref 6–24)
CO2: 25 mmol/L (ref 20–29)
Calcium: 9.5 mg/dL (ref 8.7–10.2)
Chloride: 92 mmol/L — ABNORMAL LOW (ref 96–106)
Creatinine, Ser: 0.95 mg/dL (ref 0.57–1.00)
Glucose: 84 mg/dL (ref 70–99)
Potassium: 4 mmol/L (ref 3.5–5.2)
Sodium: 132 mmol/L — ABNORMAL LOW (ref 134–144)
eGFR: 71 mL/min/{1.73_m2} (ref >59)

## 2022-11-08 DIAGNOSIS — M67441 Ganglion, right hand: Secondary | ICD-10-CM | POA: Diagnosis not present

## 2023-03-01 ENCOUNTER — Other Ambulatory Visit: Payer: Self-pay | Admitting: Nurse Practitioner

## 2023-03-01 DIAGNOSIS — Z1231 Encounter for screening mammogram for malignant neoplasm of breast: Secondary | ICD-10-CM

## 2023-03-07 ENCOUNTER — Other Ambulatory Visit (HOSPITAL_COMMUNITY): Payer: Self-pay

## 2023-03-07 ENCOUNTER — Other Ambulatory Visit: Payer: Self-pay | Admitting: Nurse Practitioner

## 2023-03-07 DIAGNOSIS — I10 Essential (primary) hypertension: Secondary | ICD-10-CM

## 2023-03-07 MED ORDER — HYDROCHLOROTHIAZIDE 25 MG PO TABS
25.0000 mg | ORAL_TABLET | Freq: Every day | ORAL | 2 refills | Status: DC
  Filled 2023-03-07: qty 90, 90d supply, fill #0
  Filled 2023-06-22: qty 90, 90d supply, fill #1

## 2023-04-03 DIAGNOSIS — H524 Presbyopia: Secondary | ICD-10-CM | POA: Diagnosis not present

## 2023-04-13 ENCOUNTER — Ambulatory Visit
Admission: RE | Admit: 2023-04-13 | Discharge: 2023-04-13 | Disposition: A | Discharge status: Home / Self Care | Source: Ambulatory Visit | Attending: Nurse Practitioner | Admitting: Nurse Practitioner

## 2023-04-13 DIAGNOSIS — R030 Elevated blood-pressure reading, without diagnosis of hypertension: Secondary | ICD-10-CM | POA: Diagnosis not present

## 2023-04-13 DIAGNOSIS — Z01419 Encounter for gynecological examination (general) (routine) without abnormal findings: Secondary | ICD-10-CM | POA: Diagnosis not present

## 2023-04-13 DIAGNOSIS — Z1211 Encounter for screening for malignant neoplasm of colon: Secondary | ICD-10-CM | POA: Diagnosis not present

## 2023-04-13 DIAGNOSIS — Z124 Encounter for screening for malignant neoplasm of cervix: Secondary | ICD-10-CM | POA: Diagnosis not present

## 2023-04-13 DIAGNOSIS — Z1339 Encounter for screening examination for other mental health and behavioral disorders: Secondary | ICD-10-CM | POA: Diagnosis not present

## 2023-04-13 DIAGNOSIS — Z1231 Encounter for screening mammogram for malignant neoplasm of breast: Secondary | ICD-10-CM | POA: Diagnosis not present

## 2023-04-13 DIAGNOSIS — Z1239 Encounter for other screening for malignant neoplasm of breast: Secondary | ICD-10-CM | POA: Diagnosis not present

## 2023-04-23 DIAGNOSIS — Z1211 Encounter for screening for malignant neoplasm of colon: Secondary | ICD-10-CM | POA: Diagnosis not present

## 2023-04-28 LAB — COLOGUARD: COLOGUARD: NEGATIVE

## 2023-05-21 ENCOUNTER — Encounter: Payer: Self-pay | Admitting: Nurse Practitioner

## 2023-05-21 ENCOUNTER — Ambulatory Visit: Payer: Commercial Managed Care - PPO | Admitting: Nurse Practitioner

## 2023-05-21 VITALS — BP 120/88 | HR 92 | Temp 98.5°F | Ht 61.4 in | Wt 132.0 lb

## 2023-05-21 DIAGNOSIS — Z79899 Other long term (current) drug therapy: Secondary | ICD-10-CM | POA: Diagnosis not present

## 2023-05-21 DIAGNOSIS — E7800 Pure hypercholesterolemia, unspecified: Secondary | ICD-10-CM | POA: Diagnosis not present

## 2023-05-21 DIAGNOSIS — R053 Chronic cough: Secondary | ICD-10-CM

## 2023-05-21 DIAGNOSIS — Z0000 Encounter for general adult medical examination without abnormal findings: Secondary | ICD-10-CM | POA: Diagnosis not present

## 2023-05-21 DIAGNOSIS — I10 Essential (primary) hypertension: Secondary | ICD-10-CM | POA: Diagnosis not present

## 2023-05-21 DIAGNOSIS — M67431 Ganglion, right wrist: Secondary | ICD-10-CM

## 2023-05-21 HISTORY — DX: Encounter for general adult medical examination without abnormal findings: Z00.00

## 2023-05-21 LAB — POCT URINALYSIS DIP (CLINITEK)
Bilirubin, UA: NEGATIVE
Glucose, UA: NEGATIVE mg/dL
Ketones, POC UA: NEGATIVE mg/dL
Leukocytes, UA: NEGATIVE
Nitrite, UA: NEGATIVE
POC PROTEIN,UA: NEGATIVE
Spec Grav, UA: 1.02 (ref 1.010–1.025)
Urobilinogen, UA: 0.2 U/dL
pH, UA: 7 (ref 5.0–8.0)

## 2023-05-21 NOTE — Assessment & Plan Note (Signed)
 Patient reports a long-standing cough unresponsive to various treatments. Differential diagnoses include chronic post-nasal drip, gastroesophageal reflux disease (GERD), asthma, and other pulmonary conditions. Plan: Refer to pulmonology for further evaluation and management.

## 2023-05-21 NOTE — Patient Instructions (Addendum)
 Health Maintenance, Female Adopting a healthy lifestyle and getting preventive care are important in promoting health and wellness. Ask your health care provider about: The right schedule for you to have regular tests and exams. Things you can do on your own to prevent diseases and keep yourself healthy. What should I know about diet, weight, and exercise? Eat a healthy diet  Eat a diet that includes plenty of vegetables, fruits, low-fat dairy products, and lean protein. Do not eat a lot of foods that are high in solid fats, added sugars, or sodium. Maintain a healthy weight Body mass index (BMI) is used to identify weight problems. It estimates body fat based on height and weight. Your health care provider can help determine your BMI and help you achieve or maintain a healthy weight. Get regular exercise Get regular exercise. This is one of the most important things you can do for your health. Most adults should: Exercise for at least 150 minutes each week. The exercise should increase your heart rate and make you sweat (moderate-intensity exercise). Do strengthening exercises at least twice a week. This is in addition to the moderate-intensity exercise. Spend less time sitting. Even light physical activity can be beneficial. Watch cholesterol and blood lipids Have your blood tested for lipids and cholesterol at 56 years of age, then have this test every 5 years. Have your cholesterol levels checked more often if: Your lipid or cholesterol levels are high. You are older than 56 years of age. You are at high risk for heart disease. What should I know about cancer screening? Depending on your health history and family history, you may need to have cancer screening at various ages. This may include screening for: Breast cancer. Cervical cancer. Colorectal cancer. Skin cancer. Lung cancer. What should I know about heart disease, diabetes, and high blood pressure? Blood pressure and heart  disease High blood pressure causes heart disease and increases the risk of stroke. This is more likely to develop in people who have high blood pressure readings or are overweight. Have your blood pressure checked: Every 3-5 years if you are 83-36 years of age. Every year if you are 43 years old or older. Diabetes Have regular diabetes screenings. This checks your fasting blood sugar level. Have the screening done: Once every three years after age 5 if you are at a normal weight and have a low risk for diabetes. More often and at a younger age if you are overweight or have a high risk for diabetes. What should I know about preventing infection? Hepatitis B If you have a higher risk for hepatitis B, you should be screened for this virus. Talk with your health care provider to find out if you are at risk for hepatitis B infection. Hepatitis C Testing is recommended for: Everyone born from [birth year] through 1963. Anyone with known risk factors for hepatitis C. Sexually transmitted infections (STIs) Get screened for STIs, including gonorrhea and chlamydia, if: You are sexually active and are younger than 56 years of age. You are older than 56 years of age and your health care provider tells you that you are at risk for this type of infection. Your sexual activity has changed since you were last screened, and you are at increased risk for chlamydia or gonorrhea. Ask your health care provider if you are at risk. Ask your health care provider about whether you are at high risk for HIV. Your health care provider may recommend a prescription medicine to help prevent HIV  infection. If you choose to take medicine to prevent HIV, you should first get tested for HIV. You should then be tested every 3 months for as long as you are taking the medicine. Pregnancy If you are about to stop having your period (premenopausal) and you may become pregnant, seek counseling before you get pregnant. Take 400 to 800  micrograms (mcg) of folic acid every day if you become pregnant. Ask for birth control (contraception) if you want to prevent pregnancy. Osteoporosis and menopause Osteoporosis is a disease in which the bones lose minerals and strength with aging. This can result in bone fractures. If you are 50 years old or older, or if you are at risk for osteoporosis and fractures, ask your health care provider if you should: Be screened for bone loss. Take a calcium or vitamin D supplement to lower your risk of fractures. Be given hormone replacement therapy (HRT) to treat symptoms of menopause. Follow these instructions at home: Alcohol use Do not drink alcohol if: Your health care provider tells you not to drink. You are pregnant, may be pregnant, or are planning to become pregnant. If you drink alcohol: Limit how much you have to: 0-1 drink a day. Know how much alcohol is in your drink. In the [place] one drink equals one 12 oz bottle of beer (355 mL), one 5 oz glass of wine (148 mL), or one 1 oz glass of hard liquor (44 mL). Lifestyle Do not use any products that contain nicotine or tobacco. These products include cigarettes, chewing tobacco, and vaping devices, such as e-cigarettes. If you need help quitting, ask your health care provider. Do not use street drugs. Do not share needles. Ask your health care provider for help if you need support or information about quitting drugs. General instructions Schedule regular health, dental, and eye exams. Stay current with your vaccines. Tell your health care provider if: You often feel depressed. You have ever been abused or do not feel safe at home. Summary Adopting a healthy lifestyle and getting preventive care are important in promoting health and wellness. Follow your health care provider's instructions about healthy diet, exercising, and getting tested or screened for diseases. Follow your health care provider's instructions on monitoring your  cholesterol and blood pressure. This information is not intended to replace advice given to you by your health care provider. Make sure you discuss any questions you have with your health care provider. You can take magnesium  glycinate for your blood pressure, this may also help with anxiety.  Document Revised: 05/10/2020 Document Reviewed: 05/10/2020 Elsevier Patient Education  2024 [employer].

## 2023-05-21 NOTE — Assessment & Plan Note (Signed)
 Cholesterol levels more increased at last visit, encouraged to eat a low fat diet with more fiber. Will recheck levels today

## 2023-05-21 NOTE — Assessment & Plan Note (Signed)
 Has had drained by hand specialist but is now back. Will be going back for f/u.

## 2023-05-21 NOTE — Assessment & Plan Note (Addendum)
 Patient has a history of hypertension currently managed with HCTZ. Blood pressure remains slightly elevated despite a healthy lifestyle. Plan tp add magnesium  glycinate supplement, to be taken in the evening. Start with the lowest available dose (200-250mg ). Continue current HCTZ regimen. Monitor blood pressure at home and report if consistently elevated. We will f/u in 3 months to see if the magnesium  has been effective. EKG done no changes

## 2023-05-21 NOTE — Assessment & Plan Note (Signed)

## 2023-05-21 NOTE — Progress Notes (Signed)
 [place], CMA,acting as a Neurosurgeon for Susanna Epley, FNP.,have documented all relevant documentation on the behalf of Susanna Epley, FNP,as directed by  Susanna Epley, FNP while in the presence of Susanna Epley, FNP.  Subjective:    Patient ID: Karen Cameron , female    DOB: 1965-04-14 , 58 y.o.   MRN: [id number]  Chief Complaint  Patient presents with   Annual Exam    Patient presents today for a physical. Patient reports compliance with her meds. Patient denies having sob,chest pain or headaches. Patient is followed by Delberta Fee for her GYN care. Patient doesn't have any questions or concerns at this time.     HPI  Patient is a post-menopausal woman with a history of hypertension here for her annual HM. She reports having a persistent cough for years that has not responded to various treatments. The patient has tried multiple specialists to include GI and ENT and medications without success. She also has high blood pressure despite maintaining a healthy lifestyle with regular exercise 3-4 times per week, including swimming, pickleball, yoga, and step exercises. The patient is currently taking HCTZ for blood pressure management. She denies any recent changes in symptoms or new health concerns.  She has seen her Gyn - Ivin Marrow PA She has seen hand specialist for her ganglion cyst that was drained but back.        Past Medical History:  Diagnosis Date   Allergy     Encounter for annual health examination 05/21/2023   Hypertension    Nausea & vomiting    SAB (spontaneous abortion)    X2     Family History  Problem Relation Age of Onset   Diabetes Mother    Cancer Father        HEAD,NECK, THROAT   Diabetes Maternal Aunt    Diabetes Maternal Uncle    Diabetes Paternal Grandmother    Allergic rhinitis Neg Hx    Asthma Neg Hx    Eczema Neg Hx    Urticaria Neg Hx      Current Outpatient Medications:    hydrochlorothiazide  (HYDRODIURIL ) 25 MG tablet, Take 1  tablet (25 mg total) by mouth daily., Disp: 90 tablet, Rfl: 2   levocetirizine (XYZAL ) 5 MG tablet, Take 1 tablet (5 mg total) by mouth every evening., Disp: 90 tablet, Rfl: 2   ondansetron  (ZOFRAN ) 4 MG tablet, Take 1 tablet (4 mg total) by mouth every 8 (eight) hours as needed for nausea or vomiting., Disp: 20 tablet, Rfl: 0   valACYclovir  (VALTREX ) 500 MG tablet, Take 1 tablet (500 mg total) by mouth 2 (two) times daily for 5 days as needed, Disp: 30 tablet, Rfl: 3  Current Facility-Administered Medications:    lidocaine  (XYLOCAINE ) 2 % (with pres) injection 60 mg, 3 mL, Other, Once, Frutoso Jing, Ezzie Holstein, MD   lidocaine  (XYLOCAINE ) 2 % jelly 1 Application, 1 Application, Urethral, Once, Arma Lamp, MD   Allergies  Allergen Reactions   Cat Dander    Dust Mite Extract    Other       The patient states she uses post menopausal status for birth control. No LMP recorded (lmp unknown). Patient is postmenopausal.. . Negative for: breast discharge, breast lump(s), breast pain and breast self exam. Associated symptoms include abnormal vaginal bleeding. Pertinent negatives include abnormal bleeding (hematology), anxiety, decreased libido, depression, difficulty falling sleep, dyspareunia, history of infertility, nocturia, sexual dysfunction, sleep disturbances, urinary incontinence, urinary urgency, vaginal discharge and vaginal itching. Diet: low  carb. The patient states her exercise level is vigorous - various activities 3-4 days a week  The patient's tobacco use is:  Social History   Tobacco Use  Smoking Status Never  Smokeless Tobacco Never  . She has been exposed to passive smoke. The patient's alcohol use is:  Social History   Substance and Sexual Activity  Alcohol Use Yes   Comment: Socially  Additional information: Last pap 03/14/2021, next one scheduled for 03/14/2024.    Review of Systems  Constitutional: Negative.   HENT: Negative.    Eyes: Negative.   Respiratory:  Negative.    Cardiovascular: Negative.   Gastrointestinal: Negative.   Endocrine: Negative.   Genitourinary: Negative.   Musculoskeletal: Negative.   Skin: Negative.   Allergic/Immunologic: Negative.   Neurological: Negative.   Hematological: Negative.   Psychiatric/Behavioral: Negative.       Today's Vitals   05/21/23 0838 05/21/23 0941  BP: (!) 120/90 120/88  Pulse: 92   Temp: 98.5 F (36.9 C)   TempSrc: Oral   Weight: 132 lb (59.9 kg)   Height: 5' 1.4" (1.56 m)   PainSc: 0-No pain    Body mass index is 24.62 kg/m.  Wt Readings from Last 3 Encounters:  05/21/23 132 lb (59.9 kg)  09/18/22 135 lb (61.2 kg)  06/06/22 135 lb (61.2 kg)     Objective:  Physical Exam Vitals and nursing note reviewed.  Constitutional:      General: She is not in acute distress.    Appearance: Normal appearance. She is well-developed.  HENT:     Head: Normocephalic and atraumatic.  Eyes:     Pupils: Pupils are equal, round, and reactive to light.  Cardiovascular:     Rate and Rhythm: Normal rate and regular rhythm.     Pulses: Normal pulses.     Heart sounds: Normal heart sounds. No murmur heard. Pulmonary:     Effort: Pulmonary effort is normal. No respiratory distress.     Breath sounds: Normal breath sounds. No wheezing.  Musculoskeletal:        General: Normal range of motion.  Skin:    General: Skin is warm and dry.     Capillary Refill: Capillary refill takes less than 2 seconds.     Comments: Right wrist with palpable ganglion cyst to right medial wrist on radial side anteriorly  Neurological:     General: No focal deficit present.     Mental Status: She is alert and oriented to person, place, and time.     Cranial Nerves: No cranial nerve deficit.     Motor: No weakness.  Psychiatric:        Mood and Affect: Mood normal.         Assessment And Plan:     Encounter for annual health examination Assessment & Plan: Behavior modifications discussed and diet history  reviewed.   Pt will continue to exercise regularly and modify diet with low GI, plant based foods and decrease intake of processed foods.  Recommend intake of daily multivitamin, Vitamin D, and calcium.  Recommend mammogram and colonoscopy for preventive screenings, as well as recommend immunizations that include influenza, TDAP, and Shingles    Essential hypertension Assessment & Plan: Patient has a history of hypertension currently managed with HCTZ. Blood pressure remains slightly elevated despite a healthy lifestyle. Plan tp add magnesium  glycinate supplement, to be taken in the evening. Start with the lowest available dose (200-250mg ). Continue current HCTZ regimen. Monitor blood pressure at home  and report if consistently elevated. We will f/u in 3 months to see if the magnesium  has been effective.  Orders: -     EKG 12-Lead -     POCT URINALYSIS DIP (CLINITEK) -     Microalbumin / creatinine urine ratio -     CMP14+EGFR  Elevated LDL cholesterol level Assessment & Plan: Cholesterol levels more increased at last visit, encouraged to eat a low fat diet with more fiber. Will recheck levels today  Orders: -     Lipid panel  Chronic cough Assessment & Plan: Patient reports a long-standing cough unresponsive to various treatments. Differential diagnoses include chronic post-nasal drip, gastroesophageal reflux disease (GERD), asthma, and other pulmonary conditions. Plan: Refer to pulmonology for further evaluation and management.    Orders: -     Pulmonary Visit  Other long term (current) drug therapy -     CBC with Differential/Platelet  Ganglion cyst of wrist, right Assessment & Plan: Has had drained by hand specialist but is now back. Will be going back for f/u.      Return for 1 year physical, 6 month bp check; 3 months BP f/u . Patient was given opportunity to ask questions. Patient verbalized understanding of the plan and was able to repeat key elements of the plan.  All questions were answered to their satisfaction.   Susanna Epley, FNP  I, Susanna Epley, FNP, have reviewed all documentation for this visit. The documentation on 05/21/23 for the exam, diagnosis, procedures, and orders are all accurate and complete.

## 2023-05-22 LAB — MICROALBUMIN / CREATININE URINE RATIO
Creatinine, Urine: 101.1 mg/dL
Microalb/Creat Ratio: 7 mg/g{creat} (ref 0–29)
Microalbumin, Urine: 6.7 ug/mL

## 2023-05-24 ENCOUNTER — Other Ambulatory Visit (HOSPITAL_COMMUNITY): Payer: Self-pay

## 2023-05-25 ENCOUNTER — Other Ambulatory Visit (HOSPITAL_COMMUNITY): Payer: Self-pay

## 2023-05-25 MED ORDER — VALACYCLOVIR HCL 500 MG PO TABS
500.0000 mg | ORAL_TABLET | Freq: Two times a day (BID) | ORAL | 11 refills | Status: AC
  Filled 2023-05-25 – 2023-10-24 (×3): qty 30, 15d supply, fill #0
  Filled 2023-11-21: qty 30, 15d supply, fill #1
  Filled 2023-12-18: qty 30, 15d supply, fill #2

## 2023-06-06 ENCOUNTER — Ambulatory Visit: Payer: Self-pay | Admitting: Nurse Practitioner

## 2023-06-07 DIAGNOSIS — M67441 Ganglion, right hand: Secondary | ICD-10-CM | POA: Diagnosis not present

## 2023-08-19 ENCOUNTER — Inpatient Hospital Stay (HOSPITAL_COMMUNITY)
Admission: EM | Admit: 2023-08-19 | Discharge: 2023-08-25 | DRG: 270 | Disposition: A | Discharge status: Home / Self Care | Attending: Internal Medicine | Admitting: Internal Medicine

## 2023-08-19 ENCOUNTER — Inpatient Hospital Stay (HOSPITAL_COMMUNITY)
Admission: EM | Disposition: A | Discharge status: Home / Self Care | Payer: Self-pay | Source: Home / Self Care | Attending: Cardiology

## 2023-08-19 ENCOUNTER — Encounter (HOSPITAL_COMMUNITY): Payer: Self-pay

## 2023-08-19 ENCOUNTER — Emergency Department (HOSPITAL_COMMUNITY)

## 2023-08-19 ENCOUNTER — Other Ambulatory Visit: Payer: Self-pay

## 2023-08-19 ENCOUNTER — Inpatient Hospital Stay (HOSPITAL_COMMUNITY)

## 2023-08-19 DIAGNOSIS — R079 Chest pain, unspecified: Principal | ICD-10-CM

## 2023-08-19 DIAGNOSIS — Z9109 Other allergy status, other than to drugs and biological substances: Secondary | ICD-10-CM | POA: Diagnosis not present

## 2023-08-19 DIAGNOSIS — I493 Ventricular premature depolarization: Secondary | ICD-10-CM | POA: Diagnosis not present

## 2023-08-19 DIAGNOSIS — I2102 ST elevation (STEMI) myocardial infarction involving left anterior descending coronary artery: Secondary | ICD-10-CM | POA: Diagnosis not present

## 2023-08-19 DIAGNOSIS — Z452 Encounter for adjustment and management of vascular access device: Secondary | ICD-10-CM | POA: Diagnosis not present

## 2023-08-19 DIAGNOSIS — K7200 Acute and subacute hepatic failure without coma: Secondary | ICD-10-CM | POA: Diagnosis present

## 2023-08-19 DIAGNOSIS — R000 Tachycardia, unspecified: Secondary | ICD-10-CM | POA: Diagnosis not present

## 2023-08-19 DIAGNOSIS — I255 Ischemic cardiomyopathy: Secondary | ICD-10-CM | POA: Diagnosis not present

## 2023-08-19 DIAGNOSIS — I213 ST elevation (STEMI) myocardial infarction of unspecified site: Secondary | ICD-10-CM

## 2023-08-19 DIAGNOSIS — Z79899 Other long term (current) drug therapy: Secondary | ICD-10-CM

## 2023-08-19 DIAGNOSIS — R918 Other nonspecific abnormal finding of lung field: Secondary | ICD-10-CM | POA: Diagnosis not present

## 2023-08-19 DIAGNOSIS — R570 Cardiogenic shock: Secondary | ICD-10-CM | POA: Diagnosis present

## 2023-08-19 DIAGNOSIS — Z833 Family history of diabetes mellitus: Secondary | ICD-10-CM

## 2023-08-19 DIAGNOSIS — I5021 Acute systolic (congestive) heart failure: Secondary | ICD-10-CM | POA: Diagnosis not present

## 2023-08-19 DIAGNOSIS — N179 Acute kidney failure, unspecified: Secondary | ICD-10-CM | POA: Diagnosis not present

## 2023-08-19 DIAGNOSIS — I2510 Atherosclerotic heart disease of native coronary artery without angina pectoris: Secondary | ICD-10-CM

## 2023-08-19 DIAGNOSIS — I10 Essential (primary) hypertension: Secondary | ICD-10-CM | POA: Diagnosis present

## 2023-08-19 DIAGNOSIS — R1110 Vomiting, unspecified: Secondary | ICD-10-CM | POA: Diagnosis not present

## 2023-08-19 DIAGNOSIS — Z955 Presence of coronary angioplasty implant and graft: Secondary | ICD-10-CM

## 2023-08-19 DIAGNOSIS — I252 Old myocardial infarction: Secondary | ICD-10-CM | POA: Insufficient documentation

## 2023-08-19 DIAGNOSIS — E785 Hyperlipidemia, unspecified: Secondary | ICD-10-CM | POA: Diagnosis not present

## 2023-08-19 DIAGNOSIS — Z7984 Long term (current) use of oral hypoglycemic drugs: Secondary | ICD-10-CM | POA: Diagnosis not present

## 2023-08-19 DIAGNOSIS — I2109 ST elevation (STEMI) myocardial infarction involving other coronary artery of anterior wall: Secondary | ICD-10-CM | POA: Diagnosis not present

## 2023-08-19 DIAGNOSIS — R0789 Other chest pain: Secondary | ICD-10-CM | POA: Diagnosis not present

## 2023-08-19 DIAGNOSIS — I472 Ventricular tachycardia, unspecified: Secondary | ICD-10-CM | POA: Diagnosis present

## 2023-08-19 DIAGNOSIS — R0989 Other specified symptoms and signs involving the circulatory and respiratory systems: Secondary | ICD-10-CM | POA: Diagnosis not present

## 2023-08-19 HISTORY — PX: IABP INSERTION: CATH118242

## 2023-08-19 HISTORY — DX: Stress incontinence (female) (male): N39.3

## 2023-08-19 HISTORY — PX: CORONARY THROMBECTOMY: CATH118304

## 2023-08-19 HISTORY — DX: Depression, unspecified: F32.A

## 2023-08-19 HISTORY — PX: CORONARY/GRAFT ACUTE MI REVASCULARIZATION: CATH118305

## 2023-08-19 HISTORY — PX: RIGHT HEART CATH: CATH118263

## 2023-08-19 HISTORY — DX: Anxiety disorder, unspecified: F41.9

## 2023-08-19 LAB — PROTIME-INR
INR: 1.1 (ref 0.8–1.2)
Prothrombin Time: 14.8 s (ref 11.4–15.2)

## 2023-08-19 LAB — I-STAT CG4 LACTIC ACID, ED: Lactic Acid, Venous: 5.7 mmol/L (ref 0.5–1.9)

## 2023-08-19 LAB — CG4 I-STAT (LACTIC ACID)
Lactic Acid, Venous: 2.3 mmol/L (ref 0.5–1.9)
Lactic Acid, Venous: 1.5 mmol/L (ref 0.5–1.9)
Lactic Acid, Venous: 3.1 mmol/L (ref 0.5–1.9)
Lactic Acid, Venous: 2.9 mmol/L (ref 0.5–1.9)
Lactic Acid, Venous: 2.5 mmol/L (ref 0.5–1.9)

## 2023-08-19 LAB — POCT I-STAT EG7
Acid-base deficit: 1 mmol/L (ref 0.0–2.0)
Acid-base deficit: 3 mmol/L — ABNORMAL HIGH (ref 0.0–2.0)
Bicarbonate: 21.7 mmol/L (ref 20.0–28.0)
Bicarbonate: 24.7 mmol/L (ref 20.0–28.0)
Calcium, Ion: 1.07 mmol/L — ABNORMAL LOW (ref 1.15–1.40)
Calcium, Ion: 1.09 mmol/L — ABNORMAL LOW (ref 1.15–1.40)
HCT: 42 % (ref 36.0–46.0)
HCT: 42 % (ref 36.0–46.0)
Hemoglobin: 14.3 g/dL (ref 12.0–15.0)
Hemoglobin: 14.3 g/dL (ref 12.0–15.0)
O2 Saturation: 58 %
O2 Saturation: 92 %
Potassium: 3.2 mmol/L — ABNORMAL LOW (ref 3.5–5.1)
Potassium: 3.3 mmol/L — ABNORMAL LOW (ref 3.5–5.1)
Sodium: 131 mmol/L — ABNORMAL LOW (ref 135–145)
Sodium: 132 mmol/L — ABNORMAL LOW (ref 135–145)
TCO2: 23 mmol/L (ref 22–32)
TCO2: 26 mmol/L (ref 22–32)
pCO2, Ven: 36 mmHg — ABNORMAL LOW (ref 44–60)
pCO2, Ven: 42.3 mmHg — ABNORMAL LOW (ref 44–60)
pH, Ven: 7.374 (ref 7.25–7.43)
pH, Ven: 7.387 (ref 7.25–7.43)
pO2, Ven: 31 mmHg — CL (ref 32–45)
pO2, Ven: 65 mmHg — ABNORMAL HIGH (ref 32–45)

## 2023-08-19 LAB — BASIC METABOLIC PANEL WITH GFR
Anion gap: 17 — ABNORMAL HIGH (ref 5–15)
Anion gap: 12 (ref 5–15)
BUN: 7 mg/dL (ref 6–20)
BUN: 10 mg/dL (ref 6–20)
CO2: 22 mmol/L (ref 22–32)
CO2: 24 mmol/L (ref 22–32)
Calcium: 9.4 mg/dL (ref 8.9–10.3)
Calcium: 8.6 mg/dL — ABNORMAL LOW (ref 8.9–10.3)
Chloride: 92 mmol/L — ABNORMAL LOW (ref 98–111)
Chloride: 95 mmol/L — ABNORMAL LOW (ref 98–111)
Creatinine, Ser: 1.18 mg/dL — ABNORMAL HIGH (ref 0.44–1.00)
Creatinine, Ser: 1.14 mg/dL — ABNORMAL HIGH (ref 0.44–1.00)
GFR, Estimated: 55 mL/min — ABNORMAL LOW (ref >60)
GFR, Estimated: 57 mL/min — ABNORMAL LOW (ref >60)
Glucose, Bld: 172 mg/dL — ABNORMAL HIGH (ref 70–99)
Glucose, Bld: 160 mg/dL — ABNORMAL HIGH (ref 70–99)
Potassium: 3 mmol/L — ABNORMAL LOW (ref 3.5–5.1)
Potassium: 3.5 mmol/L (ref 3.5–5.1)
Sodium: 131 mmol/L — ABNORMAL LOW (ref 135–145)
Sodium: 131 mmol/L — ABNORMAL LOW (ref 135–145)

## 2023-08-19 LAB — I-STAT CHEM 8, ED
BUN: 11 mg/dL (ref 6–20)
Calcium, Ion: 1.08 mmol/L — ABNORMAL LOW (ref 1.15–1.40)
Chloride: 92 mmol/L — ABNORMAL LOW (ref 98–111)
Creatinine, Ser: 1 mg/dL (ref 0.44–1.00)
Glucose, Bld: 170 mg/dL — ABNORMAL HIGH (ref 70–99)
HCT: 49 % — ABNORMAL HIGH (ref 36.0–46.0)
Hemoglobin: 16.7 g/dL — ABNORMAL HIGH (ref 12.0–15.0)
Potassium: 3 mmol/L — ABNORMAL LOW (ref 3.5–5.1)
Sodium: 134 mmol/L — ABNORMAL LOW (ref 135–145)
TCO2: 26 mmol/L (ref 22–32)

## 2023-08-19 LAB — CBC
HCT: 45.3 % (ref 36.0–46.0)
Hemoglobin: 15.1 g/dL — ABNORMAL HIGH (ref 12.0–15.0)
MCH: 29.4 pg (ref 26.0–34.0)
MCHC: 33.3 g/dL (ref 30.0–36.0)
MCV: 88.1 fL (ref 80.0–100.0)
Platelets: 444 K/uL — ABNORMAL HIGH (ref 150–400)
RBC: 5.14 MIL/uL — ABNORMAL HIGH (ref 3.87–5.11)
RDW: 12.5 % (ref 11.5–15.5)
WBC: 10.4 K/uL (ref 4.0–10.5)
nRBC: 0 % (ref 0.0–0.2)

## 2023-08-19 LAB — TROPONIN I (HIGH SENSITIVITY): Troponin I (High Sensitivity): >24000 ng/L (ref <18)

## 2023-08-19 LAB — HEMOGLOBIN A1C
Hgb A1c MFr Bld: 5.6 % (ref 4.8–5.6)
Mean Plasma Glucose: 114.02 mg/dL

## 2023-08-19 LAB — COOXEMETRY PANEL
Carboxyhemoglobin: 1.6 % — ABNORMAL HIGH (ref 0.5–1.5)
Methemoglobin: <0.7 % (ref 0.0–1.5)
O2 Saturation: 63.2 %
Total hemoglobin: 14.8 g/dL (ref 12.0–16.0)

## 2023-08-19 LAB — APTT: aPTT: >200 s (ref 24–36)

## 2023-08-19 LAB — POCT ACTIVATED CLOTTING TIME
Activated Clotting Time: 372 s
Activated Clotting Time: 435 s

## 2023-08-19 LAB — HEPATIC FUNCTION PANEL
ALT: 55 U/L — ABNORMAL HIGH (ref 0–44)
AST: 751 U/L — ABNORMAL HIGH (ref 15–41)
Albumin: 3.6 g/dL (ref 3.5–5.0)
Alkaline Phosphatase: 80 U/L (ref 38–126)
Bilirubin, Direct: 0.1 mg/dL (ref 0.0–0.2)
Indirect Bilirubin: 1 mg/dL — ABNORMAL HIGH (ref 0.3–0.9)
Total Bilirubin: 1.1 mg/dL (ref 0.0–1.2)
Total Protein: 7.1 g/dL (ref 6.5–8.1)

## 2023-08-19 LAB — LIPID PANEL
Cholesterol: 233 mg/dL — ABNORMAL HIGH (ref 0–200)
HDL: 100 mg/dL (ref >40)
LDL Cholesterol: 124 mg/dL — ABNORMAL HIGH (ref 0–99)
Total CHOL/HDL Ratio: 2.3 ratio
Triglycerides: 44 mg/dL (ref <150)
VLDL: 9 mg/dL (ref 0–40)

## 2023-08-19 LAB — MAGNESIUM: Magnesium: 1.9 mg/dL (ref 1.7–2.4)

## 2023-08-19 LAB — MRSA NEXT GEN BY PCR, NASAL: MRSA by PCR Next Gen: NOT DETECTED

## 2023-08-19 LAB — HEPARIN LEVEL (UNFRACTIONATED): Heparin Unfractionated: 0.61 [IU]/mL (ref 0.30–0.70)

## 2023-08-19 LAB — GLUCOSE, CAPILLARY: Glucose-Capillary: 154 mg/dL — ABNORMAL HIGH (ref 70–99)

## 2023-08-19 SURGERY — CORONARY/GRAFT ACUTE MI REVASCULARIZATION
Anesthesia: LOCAL

## 2023-08-19 MED ORDER — NITROGLYCERIN 1 MG/10 ML FOR IR/CATH LAB
INTRA_ARTERIAL | Status: DC | PRN
  Administered 2023-08-19: 200 ug via INTRACORONARY

## 2023-08-19 MED ORDER — TICAGRELOR 90 MG PO TABS
ORAL_TABLET | ORAL | Status: DC | PRN
  Administered 2023-08-19: 180 mg via ORAL

## 2023-08-19 MED ORDER — CHLORHEXIDINE GLUCONATE CLOTH 2 % EX PADS
6.0000 | MEDICATED_PAD | Freq: Every day | CUTANEOUS | Status: DC
  Administered 2023-08-19 – 2023-08-25 (×7): 6 via TOPICAL

## 2023-08-19 MED ORDER — POTASSIUM CHLORIDE 10 MEQ/100ML IV SOLN
10.0000 meq | INTRAVENOUS | Status: AC
  Administered 2023-08-19 (×2): 10 meq via INTRAVENOUS
  Filled 2023-08-19: qty 100

## 2023-08-19 MED ORDER — FENTANYL CITRATE (PF) 100 MCG/2ML IJ SOLN
INTRAMUSCULAR | Status: AC
  Filled 2023-08-19: qty 2

## 2023-08-19 MED ORDER — ATORVASTATIN CALCIUM 80 MG PO TABS
80.0000 mg | ORAL_TABLET | Freq: Every day | ORAL | Status: DC
  Administered 2023-08-20 – 2023-08-25 (×6): 80 mg via ORAL
  Filled 2023-08-19 (×6): qty 1

## 2023-08-19 MED ORDER — HEPARIN SODIUM (PORCINE) 1000 UNIT/ML IJ SOLN
INTRAMUSCULAR | Status: AC
  Filled 2023-08-19: qty 10

## 2023-08-19 MED ORDER — LIDOCAINE HCL (PF) 1 % IJ SOLN
INTRAMUSCULAR | Status: AC
  Filled 2023-08-19: qty 30

## 2023-08-19 MED ORDER — ASPIRIN 81 MG PO CHEW
81.0000 mg | CHEWABLE_TABLET | Freq: Every day | ORAL | Status: DC
  Administered 2023-08-20 – 2023-08-25 (×6): 81 mg via ORAL
  Filled 2023-08-19 (×6): qty 1

## 2023-08-19 MED ORDER — HEPARIN SODIUM (PORCINE) 5000 UNIT/ML IJ SOLN
4000.0000 [IU] | Freq: Once | INTRAMUSCULAR | Status: AC
  Administered 2023-08-19: 4000 [IU] via INTRAVENOUS

## 2023-08-19 MED ORDER — PROCHLORPERAZINE EDISYLATE 10 MG/2ML IJ SOLN
10.0000 mg | INTRAMUSCULAR | Status: DC | PRN
  Administered 2023-08-19 – 2023-08-25 (×9): 10 mg via INTRAVENOUS
  Filled 2023-08-19 (×14): qty 2

## 2023-08-19 MED ORDER — ASPIRIN 81 MG PO CHEW
324.0000 mg | CHEWABLE_TABLET | Freq: Once | ORAL | Status: AC
  Administered 2023-08-19: 324 mg via ORAL
  Filled 2023-08-19: qty 4

## 2023-08-19 MED ORDER — MIDAZOLAM HCL 2 MG/2ML IJ SOLN
INTRAMUSCULAR | Status: DC | PRN
  Administered 2023-08-19: 1 mg via INTRAVENOUS

## 2023-08-19 MED ORDER — ACETAMINOPHEN 325 MG PO TABS
650.0000 mg | ORAL_TABLET | ORAL | Status: DC | PRN
  Administered 2023-08-21: 650 mg via ORAL
  Filled 2023-08-19: qty 2

## 2023-08-19 MED ORDER — FENTANYL CITRATE (PF) 100 MCG/2ML IJ SOLN
INTRAMUSCULAR | Status: DC | PRN
  Administered 2023-08-19: 25 ug via INTRAVENOUS

## 2023-08-19 MED ORDER — VERAPAMIL HCL 2.5 MG/ML IV SOLN
INTRAVENOUS | Status: DC | PRN
  Administered 2023-08-19: 10 mL via INTRA_ARTERIAL

## 2023-08-19 MED ORDER — ONDANSETRON HCL 4 MG/2ML IJ SOLN
INTRAMUSCULAR | Status: DC | PRN
  Administered 2023-08-19: 4 mg via INTRAVENOUS

## 2023-08-19 MED ORDER — TICAGRELOR 90 MG PO TABS
ORAL_TABLET | ORAL | Status: AC
  Filled 2023-08-19: qty 2

## 2023-08-19 MED ORDER — ADENOSINE (DIAGNOSTIC) FOR INTRACORONARY USE
INTRAVENOUS | Status: DC | PRN
  Administered 2023-08-19 (×2): 60 ug via INTRACORONARY

## 2023-08-19 MED ORDER — LOSARTAN POTASSIUM 25 MG PO TABS
25.0000 mg | ORAL_TABLET | Freq: Every day | ORAL | Status: DC
  Administered 2023-08-19: 25 mg via ORAL
  Filled 2023-08-19: qty 1

## 2023-08-19 MED ORDER — CANGRELOR TETRASODIUM 50 MG IV SOLR
INTRAVENOUS | Status: AC
  Filled 2023-08-19: qty 50

## 2023-08-19 MED ORDER — ONDANSETRON HCL 4 MG/2ML IJ SOLN
4.0000 mg | Freq: Four times a day (QID) | INTRAMUSCULAR | Status: DC | PRN
  Administered 2023-08-19 – 2023-08-24 (×8): 4 mg via INTRAVENOUS
  Filled 2023-08-19 (×10): qty 2

## 2023-08-19 MED ORDER — MORPHINE SULFATE (PF) 2 MG/ML IV SOLN
2.0000 mg | Freq: Once | INTRAVENOUS | Status: AC
  Administered 2023-08-19: 2 mg via INTRAVENOUS
  Filled 2023-08-19: qty 1

## 2023-08-19 MED ORDER — ONDANSETRON HCL 4 MG/2ML IJ SOLN
4.0000 mg | Freq: Once | INTRAMUSCULAR | Status: AC
  Administered 2023-08-19: 4 mg via INTRAVENOUS
  Filled 2023-08-19: qty 2

## 2023-08-19 MED ORDER — SODIUM CHLORIDE 0.9 % IV SOLN
INTRAVENOUS | Status: AC | PRN
  Administered 2023-08-19: 10 mL/h via INTRAVENOUS

## 2023-08-19 MED ORDER — POTASSIUM CHLORIDE CRYS ER 20 MEQ PO TBCR
40.0000 meq | EXTENDED_RELEASE_TABLET | Freq: Once | ORAL | Status: AC
  Administered 2023-08-19: 40 meq via ORAL
  Filled 2023-08-19: qty 2

## 2023-08-19 MED ORDER — PHENYLEPHRINE 80 MCG/ML (10ML) SYRINGE FOR IV PUSH (FOR BLOOD PRESSURE SUPPORT)
PREFILLED_SYRINGE | INTRAVENOUS | Status: AC
Start: 2023-08-19 — End: 2023-08-20
  Filled 2023-08-19: qty 10

## 2023-08-19 MED ORDER — SODIUM CHLORIDE 0.9 % IV SOLN
INTRAVENOUS | Status: AC | PRN
  Administered 2023-08-19: 4 ug/kg/min via INTRAVENOUS

## 2023-08-19 MED ORDER — MORPHINE SULFATE (PF) 2 MG/ML IV SOLN
2.0000 mg | Freq: Once | INTRAVENOUS | Status: AC
  Administered 2023-08-19: 2 mg via INTRAVENOUS

## 2023-08-19 MED ORDER — SODIUM CHLORIDE 0.9 % IV SOLN: 250.0000 mL | INTRAVENOUS | Status: AC | PRN

## 2023-08-19 MED ORDER — MIDAZOLAM HCL 2 MG/2ML IJ SOLN
INTRAMUSCULAR | Status: AC
  Filled 2023-08-19: qty 2

## 2023-08-19 MED ORDER — POTASSIUM CHLORIDE 10 MEQ/100ML IV SOLN
10.0000 meq | Freq: Once | INTRAVENOUS | Status: AC
  Administered 2023-08-19: 10 meq via INTRAVENOUS
  Filled 2023-08-19: qty 100

## 2023-08-19 MED ORDER — VERAPAMIL HCL 2.5 MG/ML IV SOLN
INTRAVENOUS | Status: AC
  Filled 2023-08-19: qty 2

## 2023-08-19 MED ORDER — TICAGRELOR 90 MG PO TABS
90.0000 mg | ORAL_TABLET | Freq: Two times a day (BID) | ORAL | Status: DC
  Administered 2023-08-19 – 2023-08-25 (×12): 90 mg via ORAL
  Filled 2023-08-19 (×13): qty 1

## 2023-08-19 MED ORDER — ONDANSETRON HCL 4 MG/2ML IJ SOLN
INTRAMUSCULAR | Status: AC
  Filled 2023-08-19: qty 2

## 2023-08-19 MED ORDER — NITROPRUSSIDE SODIUM-NACL 20-0.9 MG/100ML-% IV SOLN
0.0000 ug/kg/min | INTRAVENOUS | Status: DC
  Administered 2023-08-19: 0.3 ug/kg/min via INTRAVENOUS
  Administered 2023-08-20: 0.6 ug/kg/min via INTRAVENOUS
  Administered 2023-08-20: 0.4 ug/kg/min via INTRAVENOUS
  Filled 2023-08-19 (×3): qty 100

## 2023-08-19 MED ORDER — HEPARIN (PORCINE) 25000 UT/250ML-% IV SOLN
850.0000 [IU]/h | INTRAVENOUS | Status: DC
  Administered 2023-08-19: 600 [IU]/h via INTRAVENOUS
  Administered 2023-08-21: 500 [IU]/h via INTRAVENOUS
  Filled 2023-08-19 (×2): qty 250

## 2023-08-19 MED ORDER — LIDOCAINE HCL (PF) 1 % IJ SOLN
INTRAMUSCULAR | Status: DC | PRN
  Administered 2023-08-19: 2 mL via INTRADERMAL
  Administered 2023-08-19: 5 mL via INTRADERMAL

## 2023-08-19 MED ORDER — CANGRELOR BOLUS VIA INFUSION
INTRAVENOUS | Status: DC | PRN
  Administered 2023-08-19: 1770 ug via INTRAVENOUS

## 2023-08-19 MED ORDER — SODIUM CHLORIDE 0.9% FLUSH: 3.0000 mL | INTRAVENOUS | Status: DC | PRN

## 2023-08-19 MED ORDER — ADENOSINE 12 MG/4ML IV SOLN
INTRAVENOUS | Status: AC
  Filled 2023-08-19: qty 4

## 2023-08-19 MED ORDER — MORPHINE SULFATE (PF) 2 MG/ML IV SOLN
2.0000 mg | Freq: Once | INTRAVENOUS | Status: DC
  Filled 2023-08-19: qty 1

## 2023-08-19 MED ORDER — MAGNESIUM SULFATE 2 GM/50ML IV SOLN
2.0000 g | Freq: Once | INTRAVENOUS | Status: AC
  Administered 2023-08-19: 2 g via INTRAVENOUS
  Filled 2023-08-19: qty 50

## 2023-08-19 MED ORDER — NITROGLYCERIN 1 MG/10 ML FOR IR/CATH LAB
INTRA_ARTERIAL | Status: AC
  Filled 2023-08-19: qty 10

## 2023-08-19 MED ORDER — NITROPRUSSIDE SODIUM-NACL 20-0.9 MG/100ML-% IV SOLN
0.0000 ug/kg/min | INTRAVENOUS | Status: DC
  Filled 2023-08-19: qty 100

## 2023-08-19 MED ORDER — HEPARIN (PORCINE) IN NACL 1000-0.9 UT/500ML-% IV SOLN
INTRAVENOUS | Status: DC | PRN
Start: 2023-08-19 — End: 2023-08-19
  Administered 2023-08-19 (×2): 500 mL

## 2023-08-19 MED ORDER — IOHEXOL 350 MG/ML SOLN
INTRAVENOUS | Status: DC | PRN
Start: 2023-08-19 — End: 2023-08-19
  Administered 2023-08-19: 115 mL

## 2023-08-19 MED ORDER — SODIUM CHLORIDE 0.9% FLUSH
3.0000 mL | Freq: Two times a day (BID) | INTRAVENOUS | Status: DC
  Administered 2023-08-19 – 2023-08-21 (×5): 3 mL via INTRAVENOUS

## 2023-08-19 SURGICAL SUPPLY — 22 items
BALLOON EMERGE MR 2.5X12 (BALLOONS) IMPLANT
BALLOON IABP SENS PLUS 7.5F40C (BALLOONS) IMPLANT
BALLOON IABP SENS PLUS 8F 50CC (BALLOONS) IMPLANT
CATH EXTRAC PRONTO 5.5F 138CM (CATHETERS) IMPLANT
CATH INFINITI AMBI 5FR TG (CATHETERS) IMPLANT
CATH SWAN GANZ 7F STRAIGHT (CATHETERS) IMPLANT
CATH VISTA GUIDE 6FR XB3 MULPK (CATHETERS) IMPLANT
CATH VISTA GUIDE 6FR XBLD 3.5 (CATHETERS) IMPLANT
DEVICE RAD COMP TR BAND LRG (VASCULAR PRODUCTS) IMPLANT
GLIDESHEATH SLEND SS 6F .021 (SHEATH) IMPLANT
GUIDEWIRE INQWIRE 1.5J.035X260 (WIRE) IMPLANT
KIT ENCORE 26 ADVANTAGE (KITS) IMPLANT
KIT HEMO VALVE WATCHDOG (MISCELLANEOUS) IMPLANT
KIT MICROPUNCTURE NIT STIFF (SHEATH) IMPLANT
PACK CARDIAC CATHETERIZATION (CUSTOM PROCEDURE TRAY) ×1 IMPLANT
SET ATX-X65L (MISCELLANEOUS) IMPLANT
SHEATH PINNACLE 8F 10CM (SHEATH) IMPLANT
SHEATH PROBE COVER 6X72 (BAG) IMPLANT
STENT ONYX FRONTIER 3.5X12 (Permanent Stent) IMPLANT
TUBING CIL FLEX 10 FLL-RA (TUBING) IMPLANT
WIRE MICROINTRODUCER 60CM (WIRE) IMPLANT
WIRE RUNTHROUGH .014X180CM (WIRE) IMPLANT

## 2023-08-19 NOTE — Progress Notes (Signed)
 Orthopedic Tech Progress Note Patient Details:  Karen Cameron 08/15/1965 [id number]  Patient ID: [name], female   DOB: December 12, 1965, 58 y.o.   MRN: [id number] I spoke with the RN and they said the pt has the knee immobilizer. Chandra Dorn JINNY 08/19/2023, 8:19 PM

## 2023-08-19 NOTE — Progress Notes (Signed)
 Advanced Heart Failure Rounding Note  Cardiologist: None  Chief Complaint: Chest pain Subjective:    Patient with no significant cardiac history besides hypertension who presented to the hospital after developing chest discomfort at a BBQ party yesterday evening. The pain persisted and she presented to the ED today, EKG showing anterolateral STEMI with active chest discomfort.  Called to the lab where cath showed acutely occluded LAD with significant thrombotic lesion. Patient underwent thrombectomy, IABP placement around the time and given second STEMI I performed RHC. Lactic acid initially elevated and cleared by the end of the case. Patient was augmenting well, though tachycardic, and was able to follow commands and answer questions.  At the end of the case, I discussed the plan with son and husband at bedside. 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    Objective:   Weight Range: 58.7 kg Body mass index is 23.67 kg/m.   Vital Signs:   Temp:  [98 F (36.7 C)] 98 F (36.7 C) (08/17 0924) Pulse Rate:  [0-139] 0 (08/17 1240) Resp:  [18-39] 26 (08/17 1215) BP: (120-151)/(76-119) 147/86 (08/17 1225) SpO2:  [82 %-100 %] 97 % (08/17 1220) Weight:  [58.7 kg-59 kg] 58.7 kg (08/17 1300)    Weight change: Filed Weights   08/19/23 0924 08/19/23 1300  Weight: 59 kg 58.7 kg    Intake/Output:  No intake or output data in the 24 hours ending 08/19/23 1418    Physical Exam    GENERAL: NAD, ill appearing PULM:  Normal work of breathing, CTAB CARDIAC:  JVP: flat         tachycardic rate with regular rhythm. No murmurs, rubs or gallops.  IABP at 1:1.  No edema. Warm and well perfused extremities. ABDOMEN: Soft, non-tender, non-distended. NEUROLOGIC: Patient is oriented x3 with no focal or lateralizing neurologic deficits.    Telemetry   Sinus tachy in the 120s  Medications:     Scheduled Medications:  Chlorhexidine  Gluconate Cloth  6 each Topical Daily     Infusions:  heparin  600 Units/hr (08/19/23 1329)    PRN Medications:     Patient Profile   Pantera NAYELI CALVERT is a 58 y.o. female with a history of HTN who presented with late presentation of anterior STEMI, subsequently developing cardiogenic shock requiring IABP placement.   Assessment/Plan   Cardiogenic shock: Due to late presentation of anterior STEMI, severely reduced EF on LV gram.  - IABP already in place at 1:1, tachycardia improving.  - Her SVR is severely elevated at near 3000, given worsening lactic acid after the cath lab will plan for IV nipride  with hopeful quick transition tomorrow to ARB/ARNI - Titrate nipride  to MAP <70 - Can hopefully wean IABP in the next 24 hours - Serial lactic acid overnight - Low threshold for escalation of support if worsening  Anterior STEMI: Prior history of HTN, minimal other risk factors. S/p successful PCI, complicated by shock as above. - Continue aspirin , ticagrelor , initially on cangrelor  - Atorvastatin  - Echo ordered - Heparin  gtt while on IABP  HLD: LDL 124. - Statin as above  CRITICAL CARE Performed by: Morene JINNY Brownie   Total critical care time: 90 minutes  Critical care time was exclusive of separately billable procedures and treating other patients.  Critical care was necessary to treat or prevent imminent or life-threatening deterioration.  Critical care was time spent personally by me on the following activities: development of treatment plan with patient and/or surrogate as well  as nursing, discussions with consultants, evaluation of patient's response to treatment, examination of patient, obtaining history from patient or surrogate, ordering and performing treatments and interventions, ordering and review of laboratory studies, ordering and review of radiographic studies, pulse oximetry and re-evaluation of patient's condition.    Length of Stay: 0  Morene JINNY Brownie, MD  08/19/2023, 2:18 PM  Advanced  Heart Failure Team Pager [phone] (M-F; 7a - 5p)  Please contact [facility] for night-coverage after hours (5p -7a ) and weekends on [email or web address]

## 2023-08-19 NOTE — ED Provider Notes (Signed)
 [place] EMERGENCY DEPARTMENT AT [facility] Provider Note   CSN: [id number] Arrival date & time: 08/19/23  9081     Patient presents with: Chest Pain and Emesis   Karen Cameron is a 58 y.o. female.   58 year old female with prior medical history as detailed below presents for evaluation.  Patient complains of persistent left-sided chest discomfort and pain.  Symptoms began yesterday evening around 9 or 10:00 at night.  She reports associated nausea and vomiting.  She was able to sleep after taking Phenergan suppository.  Pain persisted this morning and she came to the ED for evaluation.  She denies prior cardiac history.  Initial EKG is concerning for acute ischemia.  Code STEMI called.  Patient with history of hypertension.  No prior cardiac workup reported.  The history is provided by the patient, the spouse and medical records.       Prior to Admission medications   Medication Sig Start Date End Date Taking? Authorizing Provider  hydrochlorothiazide  (HYDRODIURIL ) 25 MG tablet Take 1 tablet (25 mg total) by mouth daily. 03/07/23   Moore, Janece, FNP  levocetirizine (XYZAL ) 5 MG tablet Take 1 tablet (5 mg total) by mouth every evening. 01/26/21   Lavell Bari LABOR, FNP  ondansetron  (ZOFRAN ) 4 MG tablet Take 1 tablet (4 mg total) by mouth every 8 (eight) hours as needed for nausea or vomiting. 01/26/21   Lavell Bari A, FNP    Allergies: Cat dander, Dust mite extract, and Other    Review of Systems  All other systems reviewed and are negative.   Updated Vital Signs BP (!) 149/117   Pulse (!) 129   Temp 98 F (36.7 C) (Oral)   Resp (!) 30   Ht 5' 2 (1.575 m)   Wt 59 kg   LMP  (LMP Unknown)   SpO2 100%   BMI 23.78 kg/m   Physical Exam Vitals and nursing note reviewed.  Constitutional:      General: She is not in acute distress.    Appearance: Normal appearance. She is well-developed.     Comments: Alert, appears uncomfortable  HENT:     Head:  Normocephalic and atraumatic.  Eyes:     Conjunctiva/sclera: Conjunctivae normal.     Pupils: Pupils are equal, round, and reactive to light.  Cardiovascular:     Rate and Rhythm: Normal rate and regular rhythm.     Heart sounds: Normal heart sounds.  Pulmonary:     Effort: Pulmonary effort is normal. No respiratory distress.     Breath sounds: Normal breath sounds.  Abdominal:     General: There is no distension.     Palpations: Abdomen is soft.     Tenderness: There is no abdominal tenderness.  Musculoskeletal:        General: No deformity. Normal range of motion.     Cervical back: Normal range of motion and neck supple.  Skin:    General: Skin is warm and dry.  Neurological:     General: No focal deficit present.     Mental Status: She is alert and oriented to person, place, and time.     (all labs ordered are listed, but only abnormal results are displayed) Labs Reviewed  CBC - Abnormal; Notable for the following components:      Result Value   RBC 5.14 (*)    Hemoglobin 15.1 (*)    Platelets 444 (*)    All other components within normal limits  I-STAT  CHEM 8, ED - Abnormal; Notable for the following components:   Sodium 134 (*)    Potassium 3.0 (*)    Chloride 92 (*)    Glucose, Bld 170 (*)    Calcium , Ion 1.08 (*)    Hemoglobin 16.7 (*)    HCT 49.0 (*)    All other components within normal limits  I-STAT CG4 LACTIC ACID, ED - Abnormal; Notable for the following components:   Lactic Acid, Venous 5.7 (*)    All other components within normal limits  BASIC METABOLIC PANEL WITH GFR  TROPONIN I (HIGH SENSITIVITY)    EKG:  EKG Interpretation Date/Time:  Sunday August 19 2023 09:29:56 EDT Ventricular Rate:  132 PR Interval:  138 QRS Duration:  96 QT Interval:  298 QTC Calculation: 441 R Axis:   -69  Text Interpretation: Critical Test Result: STEMI AGE AND GENDER SPECIFIC ECG ANALYSIS Sinus tachycardia Left anterior fascicular block Minimal voltage criteria  for LVH, may be normal variant ( Cornell product ) Anteroseptal infarct , possibly acute Inferolateral injury pattern ** ** ACUTE MI / STEMI ** ** Abnormal ECG No previous ECGs available Confirmed by Laurice Coy [phone]) on 08/19/2023 9:49:20 AM Radiology: [facility] Chest Port 1 View Result Date: 08/19/2023 CLINICAL DATA:  Chest pain and vomiting since yesterday EXAM: PORTABLE CHEST 1 VIEW COMPARISON:  None Available. FINDINGS: Single frontal view of the chest demonstrates an unremarkable cardiac silhouette. There are bilateral interstitial and ground-glass opacities, with asymmetric prominence on the left compared to the right. No effusion or pneumothorax. No acute bony abnormalities. IMPRESSION: 1. Bilateral interstitial and ground-glass opacities, which could reflect sequela of multifocal atypical or viral pneumonia versus asymmetric edema. Widespread hemorrhage or sequela of significant aspiration event could give a similar appearance. Electronically Signed   By: Ozell Daring M.D.   On: 08/19/2023 10:05     Procedures   Medications Ordered in the ED  potassium chloride  10 mEq in 100 mL IVPB (10 mEq Intravenous New Bag/Given 08/19/23 0954)  aspirin  chewable tablet 324 mg (324 mg Oral Given 08/19/23 0944)  morphine  (PF) 2 MG/ML injection 2 mg (2 mg Intravenous Given 08/19/23 0945)  ondansetron  (ZOFRAN ) injection 4 mg (4 mg Intravenous Given 08/19/23 0944)  heparin  injection 4,000 Units (4,000 Units Intravenous Given 08/19/23 0946)  morphine  (PF) 2 MG/ML injection 2 mg (2 mg Intravenous Given 08/19/23 1007)                                    Medical Decision Making Patient presents with chest pain.  Described symptoms are concerning for acute ischemia.  Initial EKG is concerning for acute ischemia.  Code STEMI called.  Patient given 81 mg x 4 aspirin .  Patient given 4000mg  heparin .  A total of 4 mg of morphine  administered for pain.  Dr. Ladona, cardiology, at bedside.  Patient taken emergently to  Cath Lab.  Amount and/or Complexity of Data Reviewed Labs: ordered. Radiology: ordered.  Risk OTC drugs. Prescription drug management. Decision regarding hospitalization.    CRITICAL CARE Performed by: Coy JAYSON Laurice   Total critical care time: 45 minutes  Critical care time was exclusive of separately billable procedures and treating other patients.  Critical care was necessary to treat or prevent imminent or life-threatening deterioration.  Critical care was time spent personally by me on the following activities: development of treatment plan with patient and/or surrogate as well as nursing, discussions  with consultants, evaluation of patient's response to treatment, examination of patient, obtaining history from patient or surrogate, ordering and performing treatments and interventions, ordering and review of laboratory studies, ordering and review of radiographic studies, pulse oximetry and re-evaluation of patient's condition.       Final diagnoses:  Chest pain, unspecified type  ST elevation myocardial infarction (STEMI), unspecified artery [facility])    ED Discharge Orders     None          Laurice Maude BROCKS, MD 08/19/23 [phone]

## 2023-08-19 NOTE — ED Notes (Signed)
Messick MD at bedside 

## 2023-08-19 NOTE — ED Triage Notes (Signed)
 Pt states she was at a cookout yesterday and started having chest pain and vomiting. Both have continued since then. Pt denies shortness of breath or fever. Pt has taken phenergan suppository w/some relief.

## 2023-08-19 NOTE — H&P (Signed)
 CARDIOLOGY ADMIT NOTE     Patient ID: Karen Cameron MRN: [id number] DOB/AGE: [birth year] 58 y.o.  Admit date: 08/19/2023.   Primary Physician:  Georgina Speaks, FNP  Patient ID: Karen Cameron, female    DOB: 1965-12-04, 58 y.o.   MRN: [id number]  Chief Complaint  Patient presents with   Chest Pain   Emesis   HPI:    Karen Cameron  is a 58 y.o. African-American female patient with no cardiac history, history of hypertension, non-smoker started having chest discomfort after a barbecue party yesterday evening associated with nausea, stayed home all night, finally came to the emergency room with her husband, EKG revealing extensive anterolateral STEMI.  Her husband is present at the bedside.  Still having ongoing chest discomfort.  No other symptoms.  There is no family history of premature artery disease, there is no history of illicit drug use.  Past Medical History:  Diagnosis Date   Acute ST elevation myocardial infarction (STEMI) of anterolateral wall (HCC) 08/19/2023   Allergy     Encounter for annual health examination 05/21/2023   Hypertension    Nausea & vomiting    SAB (spontaneous abortion)    X2   Past Surgical History:  Procedure Laterality Date   CHOLECYSTECTOMY     DILATION AND CURETTAGE OF UTERUS     TUBAL LIGATION     Social History   Tobacco Use   Smoking status: Never   Smokeless tobacco: Never  Substance Use Topics   Alcohol use: Yes    Comment: Socially   Family History  Problem Relation Age of Onset   Diabetes Mother    Cancer Father        HEAD,NECK, THROAT   Diabetes Maternal Aunt    Diabetes Maternal Uncle    Diabetes Paternal Grandmother    Allergic rhinitis Neg Hx    Asthma Neg Hx    Eczema Neg Hx    Urticaria Neg Hx     ROS  Review of Systems  Cardiovascular:  Positive for chest pain. Negative for dyspnea on exertion and leg swelling.  Gastrointestinal:  Positive for nausea and vomiting.   Objective      08/19/2023   10:00  AM 08/19/2023    9:55 AM 08/19/2023    9:49 AM  Vitals with BMI  Systolic 149 147   Diastolic 117 113   Pulse 129 128 132    Physical Exam Neck:     Vascular: No carotid bruit or JVD.  Cardiovascular:     Rate and Rhythm: Normal rate and regular rhythm.     Pulses: Intact distal pulses.     Heart sounds: No murmur heard.    Gallop present. S4 sounds present.  Pulmonary:     Effort: Pulmonary effort is normal.     Breath sounds: Normal breath sounds.  Abdominal:     General: Bowel sounds are normal.     Palpations: Abdomen is soft.  Musculoskeletal:     Right lower leg: No edema.     Left lower leg: No edema.    Laboratory examination:    Recent Labs    09/18/22 0946 08/19/23 0948  NA 132* 134*  K 4.0 3.0*  CL 92* 92*  CO2 25  --   GLUCOSE 84 170*  BUN 10 11  CREATININE 0.95 1.00  CALCIUM  9.5  --    estimated creatinine clearance is 50.3 mL/min (by C-G formula based on SCr of 1 mg/dL).  Latest Ref Rng & Units 08/19/2023    9:48 AM 09/18/2022    9:46 AM 05/16/2022   10:26 AM  CMP  Glucose 70 - 99 mg/dL 829  84  78   BUN 6 - 20 mg/dL 11  10  10    Creatinine 0.44 - 1.00 mg/dL 8.99  9.04  8.94   Sodium 135 - 145 mmol/L 134  132  132   Potassium 3.5 - 5.1 mmol/L 3.0  4.0  4.6   Chloride 98 - 111 mmol/L 92  92  92   CO2 20 - 29 mmol/L  25  24   Calcium  8.7 - 10.2 mg/dL  9.5  89.9   Total Protein 6.0 - 8.5 g/dL   7.8   Total Bilirubin 0.0 - 1.2 mg/dL   0.7   Alkaline Phos 44 - 121 IU/L   88   AST 0 - 40 IU/L   26   ALT 0 - 32 IU/L   15       Latest Ref Rng & Units 08/19/2023    9:48 AM 08/19/2023    9:30 AM 05/16/2022   10:26 AM  CBC  WBC 4.0 - 10.5 K/uL  10.4  5.2   Hemoglobin 12.0 - 15.0 g/dL 83.2  84.8  86.0   Hematocrit 36.0 - 46.0 % 49.0  45.3  42.7   Platelets 150 - 400 K/uL  444  343    Lipid Panel     Component Value Date/Time   CHOL 240 (H) 05/16/2022 1026   TRIG 48 05/16/2022 1026   HDL 93 05/16/2022 1026   CHOLHDL 2.6 05/16/2022 1026    LDLCALC 139 (H) 05/16/2022 1026   HEMOGLOBIN A1C Lab Results  Component Value Date   HGBA1C 5.3 05/05/2021   TSH No results for input(s): TSH in the last 8760 hours. BNP (last 3 results) No results for input(s): BNP in the last 8760 hours. Cardiac Panel (last 3 results) No results for input(s): CKTOTAL, CKMB, TROPONINIHS, RELINDX in the last 72 hours.   Medications and allergies   Allergies  Allergen Reactions   Cat Dander    Dust Mite Extract    Other      Prior to Admission medications   Medication Sig Start Date End Date Taking? Authorizing Provider  hydrochlorothiazide  (HYDRODIURIL ) 25 MG tablet Take 1 tablet (25 mg total) by mouth daily. 03/07/23   Moore, Janece, FNP  levocetirizine (XYZAL ) 5 MG tablet Take 1 tablet (5 mg total) by mouth every evening. 01/26/21   Lavell Bari LABOR, FNP  ondansetron  (ZOFRAN ) 4 MG tablet Take 1 tablet (4 mg total) by mouth every 8 (eight) hours as needed for nausea or vomiting. 01/26/21   Lavell Bari LABOR, FNP     Radiology:  [facility] Chest Port 1 View Result Date: 08/19/2023 CLINICAL DATA:  Chest pain and vomiting since yesterday EXAM: PORTABLE CHEST 1 VIEW COMPARISON:  None Available. FINDINGS: Single frontal view of the chest demonstrates an unremarkable cardiac silhouette. There are bilateral interstitial and ground-glass opacities, with asymmetric prominence on the left compared to the right. No effusion or pneumothorax. No acute bony abnormalities. IMPRESSION: 1. Bilateral interstitial and ground-glass opacities, which could reflect sequela of multifocal atypical or viral pneumonia versus asymmetric edema. Widespread hemorrhage or sequela of significant aspiration event could give a similar appearance. Electronically Signed   By: Ozell Daring M.D.   On: 08/19/2023 10:05    Cardiac Studies:   EKG 08/19/2023: Sinus tachycardia at rate of  132 bpm, LAFB, extensive anterolateral STEMI.  Assessment   1.  Acute anterolateral  STEMI  Recommendations:   Patient presented late with chest discomfort and nausea that started yesterday evening, she still has ongoing chest discomfort with marked ST elevation in the anterolateral leads.  Will proceed directly with cardiac catheterization on emergent basis.  Patient is full code, discussed regarding CPR and need for intubation.  Patient is aware of the risks of cardiac catheterization as well.  Husband is present and all questions answered.    Gordy Bergamo, MD, [facility] 08/19/2023, 10:17 AM [facility] [address] [place], [place] [address] Phone: [phone]. Fax:  [phone]

## 2023-08-19 NOTE — Progress Notes (Signed)
 PHARMACY - ANTICOAGULATION CONSULT NOTE  Pharmacy Consult for heparin  Indication: IABP  No Known Allergies   Patient Measurements: Height: 5' 2 (157.5 cm) Weight: 58.7 kg (129 lb 6.6 oz) IBW/kg (Calculated) : 50.1 HEPARIN  DW (KG): 59  Vital Signs: Temp: 99.7 F (37.6 C) (08/17 2015) Temp Source: Core (08/17 1600) BP: 101/51 (08/17 2000) Pulse Rate: 162 (08/17 2015)  Labs: Recent Labs    08/19/23 0930 08/19/23 0948 08/19/23 1139 08/19/23 1143 08/19/23 1309 08/19/23 2009  HGB 15.1* 16.7* 14.3 14.3  --   --   HCT 45.3 49.0* 42.0 42.0  --   --   PLT 444*  --   --   --   --   --   APTT  --   --   --   --  >200*  --   LABPROT  --   --   --   --  14.8  --   INR  --   --   --   --  1.1  --   HEPARINUNFRC  --   --   --   --   --  0.61  CREATININE 1.18* 1.00  --   --  1.14*  --   TROPONINIHS >24,000*  --   --   --   --   --     Estimated Creatinine Clearance: 44.1 mL/min (A) (by C-G formula based on SCr of 1.14 mg/dL (H)).   Medical History: Past Medical History:  Diagnosis Date   Acute ST elevation myocardial infarction (STEMI) of anterolateral wall (HCC) 08/19/2023   Allergy     Encounter for annual health examination 05/21/2023   Hypertension    Nausea & vomiting    SAB (spontaneous abortion)    X2    Medications:  Scheduled:   [START ON 08/20/2023] aspirin   81 mg Oral Daily   [START ON 08/20/2023] atorvastatin   80 mg Oral Daily   Chlorhexidine  Gluconate Cloth  6 each Topical Daily   losartan   25 mg Oral Daily   phenylephrine        sodium chloride  flush  3 mL Intravenous Q12H   ticagrelor   90 mg Oral BID    Assessment: 69 yof presenting with STEMI - finding occlusion in proxLAD now with DES, IABP placed to assist with perfusion. No AC PTA.  Heparin  level slightly elevated above goal range at 0.61, bleeding at fem site but radial looks ok.  Goal of Therapy:  Heparin  level 0.2-0.5 units/ml Monitor platelets by anticoagulation protocol: Yes   Plan:   Reduce heparin  to 500 units/h Repeat heparin  level with am labs  Ozell Jamaica, PharmD, BCPS, [facility] Clinical Pharmacist [phone] Please check AMION for all [facility] Pharmacy numbers 08/19/2023

## 2023-08-19 NOTE — Progress Notes (Signed)
 PHARMACY - ANTICOAGULATION CONSULT NOTE  Pharmacy Consult for heparin  Indication: IABP  Allergies  Allergen Reactions   Cat Dander    Dust Mite Extract    Other     Patient Measurements: Height: 5' 2 (157.5 cm) Weight: 59 kg (130 lb) IBW/kg (Calculated) : 50.1 HEPARIN  DW (KG): 59  Vital Signs: Temp: 98 F (36.7 C) (08/17 0924) Temp Source: Oral (08/17 0924) BP: 150/82 (08/17 1149) Pulse Rate: 129 (08/17 1000)  Labs: Recent Labs    08/19/23 0930 08/19/23 0948 08/19/23 1139 08/19/23 1143  HGB 15.1* 16.7* 14.3 14.3  HCT 45.3 49.0* 42.0 42.0  PLT 444*  --   --   --   CREATININE 1.18* 1.00  --   --   TROPONINIHS >24,000*  --   --   --     Estimated Creatinine Clearance: 50.3 mL/min (by C-G formula based on SCr of 1 mg/dL).   Medical History: Past Medical History:  Diagnosis Date   Acute ST elevation myocardial infarction (STEMI) of anterolateral wall (HCC) 08/19/2023   Allergy     Encounter for annual health examination 05/21/2023   Hypertension    Nausea & vomiting    SAB (spontaneous abortion)    X2    Medications:  Scheduled:   nitroGLYCERIN         Assessment: 39 yof presenting with STEMI - finding occlusion in proxLAD now with DES, IABP placed to assist with perfusion. No AC PTA.  Hgb 14.3, plt 435. Trop >24k. Received cangrelor  during procedure due to nausea but transitioned now to brilinta . No s/sx of bleeding.   Goal of Therapy:  Heparin  level 0.2-0.5 units/ml Monitor platelets by anticoagulation protocol: Yes   Plan:  Start heparin  infusion at 600 units/hr Order heparin  level in 6 hours  Monitor daily HL, CBC, and for s/sx of bleeding   Thank you for allowing pharmacy to participate in this patient's care,  Suzen Sour, PharmD, BCCCP Clinical Pharmacist  Phone: [phone] 08/19/2023 12:10 PM  Please check AMION for all [facility] Pharmacy phone numbers After 10:00 PM, call Main Pharmacy [phone]

## 2023-08-20 ENCOUNTER — Encounter (HOSPITAL_COMMUNITY): Payer: Self-pay | Admitting: Cardiology

## 2023-08-20 ENCOUNTER — Other Ambulatory Visit (HOSPITAL_COMMUNITY): Payer: Self-pay

## 2023-08-20 ENCOUNTER — Inpatient Hospital Stay (HOSPITAL_COMMUNITY)

## 2023-08-20 ENCOUNTER — Telehealth (HOSPITAL_COMMUNITY): Payer: Self-pay | Admitting: Pharmacy Technician

## 2023-08-20 DIAGNOSIS — R000 Tachycardia, unspecified: Secondary | ICD-10-CM

## 2023-08-20 DIAGNOSIS — E785 Hyperlipidemia, unspecified: Secondary | ICD-10-CM

## 2023-08-20 DIAGNOSIS — I10 Essential (primary) hypertension: Secondary | ICD-10-CM

## 2023-08-20 DIAGNOSIS — R570 Cardiogenic shock: Secondary | ICD-10-CM

## 2023-08-20 DIAGNOSIS — I2109 ST elevation (STEMI) myocardial infarction involving other coronary artery of anterior wall: Principal | ICD-10-CM

## 2023-08-20 LAB — COMPREHENSIVE METABOLIC PANEL WITH GFR
ALT: 46 U/L — ABNORMAL HIGH (ref 0–44)
AST: 472 U/L — ABNORMAL HIGH (ref 15–41)
Albumin: 3.2 g/dL — ABNORMAL LOW (ref 3.5–5.0)
Alkaline Phosphatase: 62 U/L (ref 38–126)
Anion gap: 9 (ref 5–15)
BUN: 16 mg/dL (ref 6–20)
CO2: 24 mmol/L (ref 22–32)
Calcium: 8.4 mg/dL — ABNORMAL LOW (ref 8.9–10.3)
Chloride: 100 mmol/L (ref 98–111)
Creatinine, Ser: 1.29 mg/dL — ABNORMAL HIGH (ref 0.44–1.00)
GFR, Estimated: 49 mL/min — ABNORMAL LOW (ref >60)
Glucose, Bld: 156 mg/dL — ABNORMAL HIGH (ref 70–99)
Potassium: 3.9 mmol/L (ref 3.5–5.1)
Sodium: 133 mmol/L — ABNORMAL LOW (ref 135–145)
Total Bilirubin: 1.1 mg/dL (ref 0.0–1.2)
Total Protein: 6.2 g/dL — ABNORMAL LOW (ref 6.5–8.1)

## 2023-08-20 LAB — COOXEMETRY PANEL
Carboxyhemoglobin: 1.2 % (ref 0.5–1.5)
Carboxyhemoglobin: 1.7 % — ABNORMAL HIGH (ref 0.5–1.5)
Carboxyhemoglobin: 1.8 % — ABNORMAL HIGH (ref 0.5–1.5)
Methemoglobin: <0.7 % (ref 0.0–1.5)
Methemoglobin: <0.7 % (ref 0.0–1.5)
Methemoglobin: <0.7 % (ref 0.0–1.5)
O2 Saturation: 42.9 %
O2 Saturation: 68.9 %
O2 Saturation: 64.9 %
Total hemoglobin: 11.5 g/dL — ABNORMAL LOW (ref 12.0–16.0)
Total hemoglobin: 11.7 g/dL — ABNORMAL LOW (ref 12.0–16.0)
Total hemoglobin: 11.1 g/dL — ABNORMAL LOW (ref 12.0–16.0)

## 2023-08-20 LAB — CBC
HCT: 36.3 % (ref 36.0–46.0)
Hemoglobin: 12 g/dL (ref 12.0–15.0)
MCH: 29.6 pg (ref 26.0–34.0)
MCHC: 33.1 g/dL (ref 30.0–36.0)
MCV: 89.6 fL (ref 80.0–100.0)
Platelets: 326 K/uL (ref 150–400)
RBC: 4.05 MIL/uL (ref 3.87–5.11)
RDW: 12.9 % (ref 11.5–15.5)
WBC: 16.8 K/uL — ABNORMAL HIGH (ref 4.0–10.5)
nRBC: 0 % (ref 0.0–0.2)

## 2023-08-20 LAB — CG4 I-STAT (LACTIC ACID)
Lactic Acid, Venous: 1.9 mmol/L (ref 0.5–1.9)
Lactic Acid, Venous: 1.8 mmol/L (ref 0.5–1.9)
Lactic Acid, Venous: 2 mmol/L (ref 0.5–1.9)
Lactic Acid, Venous: 1 mmol/L (ref 0.5–1.9)
Lactic Acid, Venous: 0.9 mmol/L (ref 0.5–1.9)

## 2023-08-20 LAB — ECHOCARDIOGRAM COMPLETE
Calc EF: 46.6 %
Height: 62 in
S' Lateral: 1.9 cm
Single Plane A2C EF: 43.8 %
Single Plane A4C EF: 47.1 %
Weight: 2070.56 [oz_av]

## 2023-08-20 LAB — HEPARIN LEVEL (UNFRACTIONATED): Heparin Unfractionated: 0.22 [IU]/mL — ABNORMAL LOW (ref 0.30–0.70)

## 2023-08-20 LAB — GLUCOSE, CAPILLARY: Glucose-Capillary: 149 mg/dL — ABNORMAL HIGH (ref 70–99)

## 2023-08-20 MED ORDER — DOBUTAMINE-DEXTROSE 4-5 MG/ML-% IV SOLN
2.0000 ug/kg/min | INTRAVENOUS | Status: DC
  Administered 2023-08-20: 2.5 ug/kg/min via INTRAVENOUS
  Filled 2023-08-20: qty 250

## 2023-08-20 MED ORDER — DIGOXIN 125 MCG PO TABS
0.1250 mg | ORAL_TABLET | Freq: Every day | ORAL | Status: DC
  Administered 2023-08-20 – 2023-08-25 (×6): 0.125 mg via ORAL
  Filled 2023-08-20 (×6): qty 1

## 2023-08-20 MED ORDER — LIDOCAINE HCL (PF) 1 % IJ SOLN
INTRAMUSCULAR | Status: AC
  Filled 2023-08-20: qty 5

## 2023-08-20 MED ORDER — POTASSIUM CHLORIDE 20 MEQ PO PACK
20.0000 meq | PACK | Freq: Once | ORAL | Status: AC
  Administered 2023-08-20: 20 meq via ORAL
  Filled 2023-08-20: qty 1

## 2023-08-20 MED ORDER — GUAIFENESIN-DM 100-10 MG/5ML PO SYRP
5.0000 mL | ORAL_SOLUTION | ORAL | Status: DC | PRN
  Administered 2023-08-20 – 2023-08-24 (×5): 5 mL via ORAL
  Filled 2023-08-20 (×6): qty 5

## 2023-08-20 MED ORDER — HYDRALAZINE HCL 20 MG/ML IJ SOLN
20.0000 mg | Freq: Once | INTRAMUSCULAR | Status: AC
  Administered 2023-08-20: 20 mg via INTRAVENOUS
  Filled 2023-08-20: qty 1

## 2023-08-20 MED ORDER — SACUBITRIL-VALSARTAN 24-26 MG PO TABS
1.0000 | ORAL_TABLET | Freq: Two times a day (BID) | ORAL | Status: DC
  Administered 2023-08-20 – 2023-08-25 (×9): 1 via ORAL
  Filled 2023-08-20 (×12): qty 1

## 2023-08-20 MED ORDER — POTASSIUM CHLORIDE CRYS ER 20 MEQ PO TBCR
20.0000 meq | EXTENDED_RELEASE_TABLET | Freq: Once | ORAL | Status: DC
  Filled 2023-08-20: qty 1

## 2023-08-20 MED ORDER — PERFLUTREN LIPID MICROSPHERE
1.0000 mL | INTRAVENOUS | Status: AC | PRN
  Administered 2023-08-20: 2 mL via INTRAVENOUS

## 2023-08-20 MED ORDER — SPIRONOLACTONE 12.5 MG HALF TABLET
12.5000 mg | ORAL_TABLET | Freq: Every day | ORAL | Status: DC
  Administered 2023-08-20 – 2023-08-23 (×4): 12.5 mg via ORAL
  Filled 2023-08-20 (×4): qty 1

## 2023-08-20 NOTE — Progress Notes (Addendum)
 Advanced Heart Failure Rounding Note  Cardiologist: None  Chief Complaint: Chest pain  Patient Profile   Karen Cameron is a 58 y.o. female with a history of HTN who presented with late presentation of anterior STEMI, subsequently developing cardiogenic shock requiring IABP placement.   Cardiac Studies   LHC: 1V CAD, 100% pLAD, s/p PCI + DES, LVEDP 22, EF 25%   RHC:  HEMODYNAMICS: RA:       2 mmHg (mean) RV:       33/1, 2 mmHg PA:       33/14 mmHg (20 mean) PCWP: 12 mmHg (mean)                                      Estimated Fick CO/CI   3.03L/min, 1.9L/min/m2         Thermodilution CO/CI   2.78L/min, 1.75L/min/m2                                      TPG  8  mmHg                                                 PVR  2.8 Bradburn Units  PAPi  9.5       IMPRESSION: Late presenting anterior MI complicated by MI shock s/p IABP placement Low filling pressures Normal PA pressure, elevated PVR a function of reduced cardiac output Severely reduced cardiac output by TD Severely elevated SVR of 2964 dynes/sec*cm-5  2D Echo: pending   Subjective:    Continues w/ IABP at 1:1. On Nipride  gtt @ 0.7 mcg/kg/min + heparin  gtt.   Stable w/o CP. Denies dyspnea.   RN reports IABP groin site was oozy overnight, multiple dressing changes. She denies leg pain, numbness/tingling.   MAP 74   Swan  #s CVP 4 PAP 25/13 CO 4.02 CI 2.1  PAPi 3  SVR 1393   Lactic acid 3.1>>2.9>>2.5>>1.9>>1.8   Awaiting 2D Echo   Objective:   Weight Range: 58.7 kg Body mass index is 23.67 kg/m.   Vital Signs:   Temp:  [98 F (36.7 C)-100 F (37.8 C)] 99.7 F (37.6 C) (08/18 0700) Pulse Rate:  [0-300] 159 (08/18 0700) Resp:  [11-39] 27 (08/18 0700) BP: (95-151)/(51-119) 101/61 (08/18 0700) SpO2:  [82 %-100 %] 94 % (08/18 0700) Weight:  [58.7 kg-59 kg] 58.7 kg (08/17 1300) Last BM Date : 08/19/23  Weight change: Filed Weights   08/19/23 0924 08/19/23 1300  Weight: 59 kg 58.7 kg     Intake/Output:   Intake/Output Summary (Last 24 hours) at 08/20/2023 9271 Last data filed at 08/20/2023 0700 Gross per 24 hour  Intake 706.92 ml  Output 735 ml  Net -28.08 ml      Physical Exam    GENERAL: fatigued appearing, no respiratory difficulty  Lungs- clear anteriorly  CARDIAC:  JVP: 5 cm         Regular rhythm, tachy rate, + IABP sounds. No LEE. +IABP site right groin bandaged   ABDOMEN: Soft, non-tender, non-distended.  EXTREMITIES: Warm and well perfused.  NEUROLOGIC: No obvious FND  Telemetry   Sinus tach 120s, personally review   Medications:  Scheduled Medications:  aspirin   81 mg Oral Daily   atorvastatin   80 mg Oral Daily   Chlorhexidine  Gluconate Cloth  6 each Topical Daily   potassium chloride   20 mEq Oral Once   sodium chloride  flush  3 mL Intravenous Q12H   ticagrelor   90 mg Oral BID    Infusions:  sodium chloride      heparin  500 Units/hr (08/20/23 0700)   nitroPRUSSide 0.7 mcg/kg/min (08/20/23 0700)    PRN Medications: sodium chloride , acetaminophen , ondansetron  (ZOFRAN ) IV, prochlorperazine , sodium chloride  flush     Assessment/Plan   1. Cardiogenic shock: Due to late presentation of anterior STEMI, severely reduced EF on LV gram.  - IABP at 1:1, remains tachycardiac. CI 2.1 D/w Dr. Zenaida, will turn IABP down to 1:2   - remains on IV nipride  @ 7 mcg/kg/min for severely elevated SVR at time of cath (3000). SVR down to 1393 today. MAP 74. SCr 1.3 (b/l ~1)  - Will work on transition to Entresto  24-26 mg bid + spiro 12.5 mg and wean off nipride  - start digoxin  0.125 mg  - obtain complete 2D Echo today  - Can hopefully wean off IABP in the next 24 hours  2. Anterior STEMI/CAD: Prior history of HTN, minimal other risk factors. Cath w/ 1V occlusive disease, 100%, pLAD. S/p successful PCI, complicated by shock as above. - CP free  - Continue aspirin  + ticagrelor , initially on cangrelor  - Atorvastatin  80 mg daily  - no ? blocker w/  shock  - Echo ordered - Heparin  gtt while on IABP  3. HLD: LDL 124. - Statin as above - LDL Goal < 55   4. Shock Liver - improving - AST 751>>472 - ALT 55>>46  - continue to trend   5. Hypertension  - wean nipride  and start HF GDMT per above   6. AKI  - baseline SCr ~1.0  - SCr 1.3 in setting of CS - follow BMP   CRITICAL CARE Performed by: Caffie Shed   Total critical care time: 15 minutes  Critical care time was exclusive of separately billable procedures and treating other patients.  Critical care was necessary to treat or prevent imminent or life-threatening deterioration.  Critical care was time spent personally by me on the following activities: development of treatment plan with patient and/or surrogate as well as nursing, discussions with consultants, evaluation of patient's response to treatment, examination of patient, obtaining history from patient or surrogate, ordering and performing treatments and interventions, ordering and review of laboratory studies, ordering and review of radiographic studies, pulse oximetry and re-evaluation of patient's condition.    Length of Stay: 1  Brittainy Simmons, PA-C  08/20/2023, 7:28 AM  Advanced Heart Failure Team Pager [phone] (M-F; 7a - 5p)  Please contact [facility] for night-coverage after hours (5p -7a ) and weekends on [email or web address]  Agree with above.   Remains on IABP now down to 1:2. On Nipride  0.7 Hemodynamics improved. Denies CP or SOB  Echo being done currently   Swan numbers reviewed personally  General: Lying in bed No resp difficulty HEENT: normal Neck: supple. no JVD.  Cor: PMI nondisplaced. Regular tachy. No rubs, gallops or murmurs. Lungs: clear Abdomen: soft, nontender, nondistended. No hepatosplenomegaly. No bruits or masses. Good bowel sounds. Extremities: no cyanosis, clubbing, rash, edema IABP ok Neuro: alert & orientedx3, cranial nerves grossly intact. moves all 4 extremities w/o  difficulty. Affect pleasant  She has had a very large LAD infarct with lat presentation. Hemodynamics remain tenuous  but improving. Titrate GDMT. Wean IABP followed by nipride . Continue heparin .  Transplant would be bail out option but hope she will not need.   CRITICAL CARE Performed by: Cherrie Sieving  Total critical care time: 35 minutes  Critical care time was exclusive of separately billable procedures and treating other patients.  Critical care was necessary to treat or prevent imminent or life-threatening deterioration.  Critical care was time spent personally by me (independent of midlevel providers or residents) on the following activities: development of treatment plan with patient and/or surrogate as well as nursing, discussions with consultants, evaluation of patient's response to treatment, examination of patient, obtaining history from patient or surrogate, ordering and performing treatments and interventions, ordering and review of laboratory studies, ordering and review of radiographic studies, pulse oximetry and re-evaluation of patient's condition.  Sieving Cherrie, MD  9:07 AM

## 2023-08-20 NOTE — Progress Notes (Signed)
 PHARMACY - ANTICOAGULATION CONSULT NOTE  Pharmacy Consult for heparin  Indication: IABP  No Known Allergies   Patient Measurements: Height: 5' 2 (157.5 cm) Weight: 58.7 kg (129 lb 6.6 oz) IBW/kg (Calculated) : 50.1 HEPARIN  DW (KG): 59  Vital Signs: Temp: 99.7 F (37.6 C) (08/18 0700) BP: 101/61 (08/18 0700) Pulse Rate: 159 (08/18 0700)  Labs: Recent Labs    08/19/23 0930 08/19/23 0948 08/19/23 1139 08/19/23 1143 08/19/23 1309 08/19/23 2009 08/20/23 0424  HGB 15.1* 16.7* 14.3 14.3  --   --  12.0  HCT 45.3 49.0* 42.0 42.0  --   --  36.3  PLT 444*  --   --   --   --   --  326  APTT  --   --   --   --  >200*  --   --   LABPROT  --   --   --   --  14.8  --   --   INR  --   --   --   --  1.1  --   --   HEPARINUNFRC  --   --   --   --   --  0.61 0.22*  CREATININE 1.18* 1.00  --   --  1.14*  --  1.29*  TROPONINIHS >24,000*  --   --   --   --   --   --     Estimated Creatinine Clearance: 39 mL/min (A) (by C-G formula based on SCr of 1.29 mg/dL (H)).   Medical History: Past Medical History:  Diagnosis Date   Acute ST elevation myocardial infarction (STEMI) of anterolateral wall (HCC) 08/19/2023   Allergy     Encounter for annual health examination 05/21/2023   Hypertension    Nausea & vomiting    SAB (spontaneous abortion)    X2    Medications:  Scheduled:   aspirin   81 mg Oral Daily   atorvastatin   80 mg Oral Daily   Chlorhexidine  Gluconate Cloth  6 each Topical Daily   potassium chloride   20 mEq Oral Once   sodium chloride  flush  3 mL Intravenous Q12H   ticagrelor   90 mg Oral BID    Assessment: 64 yof presenting with STEMI - finding occlusion in proxLAD now with DES, IABP placed to assist with perfusion. No AC PTA.  Heparin  level 0.22 is therapeutic with heparin  running at 500 units/hr. Hgb (12.0) and PLTs (326) are slightly downtrending, but stable. Per RN, some oozing from femoral site of IABP, had to change dressing 4 times yesterday. Will continue to  monitor closely.     Goal of Therapy:  Heparin  level 0.2-0.5 units/ml Monitor platelets by anticoagulation protocol: Yes   Plan:  Continue heparin  at 500 units/h Monitor daily heparin  level, CBC, and signs/symptoms of bleeding  Thank you for allowing pharmacy to be a part of this patient's care.   Nidia Schaffer, PharmD PGY2 Cardiology Pharmacy Resident  Please check AMION for all [facility] Pharmacy phone numbers After 10:00 PM, call Main Pharmacy [phone] 08/20/2023 7:55 AM

## 2023-08-20 NOTE — Progress Notes (Signed)
  Patient seen multiple times throughout late afternoon and evening.   MAPs dropped so nipride  stopped.   Hemodynamics with low output CI 1.8. and SVR 1650.   Switched to DBA 2.5  Co-ox improved and lactate cleared.   However patient became hypertensive and tachycardic.  I switched IABP to 1:2, gave hydralazine  and turned DBA down to 2  Will continue to follow closely for need for advanced therapies/MCS   I updated her husband at the bedside  Additional cct throughout the evening = 90 mins  Toribio Fuel, MD  11:16 PM

## 2023-08-20 NOTE — Telephone Encounter (Addendum)
 Patient Product/process development scientist completed.    The patient is insured through [facility]. Patient has [employer], may use a copay card, and/or apply for patient assistance if available.    Ran test claim for ticagrelor  (Brilinta ) 90 mg and the current 30 day co-pay is $5.00.  Ran test claim for sacubitril -valsartan  (Entresto ) 24-26 mg and the current 30 day co-pay is $5.00.  This test claim was processed through [facility]- copay amounts may vary at other pharmacies due to pharmacy/plan contracts, or as the patient moves through the different stages of their insurance plan.     Reyes Sharps, CPHT Pharmacy Technician III Certified Patient Advocate [facility] Pharmacy Patient Advocate Team Direct Number: [phone]  Fax: [phone]

## 2023-08-20 NOTE — Plan of Care (Signed)
  Problem: Education: Goal: Knowledge of General Education information will improve Description: Including pain rating scale, medication(s)/side effects and non-pharmacologic comfort measures Outcome: Progressing   Problem: Health Behavior/Discharge Planning: Goal: Ability to manage health-related needs will improve Outcome: Progressing   Problem: Safety: Goal: Ability to remain free from injury will improve Outcome: Progressing   Problem: Skin Integrity: Goal: Risk for impaired skin integrity will decrease Outcome: Progressing   Problem: Education: Goal: Understanding of CV disease, CV risk reduction, and recovery process will improve Outcome: Progressing   Problem: Cardiovascular: Goal: Vascular access site(s) Level 0-1 will be maintained Outcome: Progressing   Problem: Education: Goal: Knowledge of General Education information will improve Description: Including pain rating scale, medication(s)/side effects and non-pharmacologic comfort measures Outcome: Progressing   Problem: Health Behavior/Discharge Planning: Goal: Ability to manage health-related needs will improve Outcome: Progressing   Problem: Safety: Goal: Ability to remain free from injury will improve Outcome: Progressing   Problem: Skin Integrity: Goal: Risk for impaired skin integrity will decrease Outcome: Progressing   Problem: Education: Goal: Understanding of CV disease, CV risk reduction, and recovery process will improve Outcome: Progressing   Problem: Cardiovascular: Goal: Vascular access site(s) Level 0-1 will be maintained Outcome: Progressing   Problem: Activity: Goal: Risk for activity intolerance will decrease Outcome: Not Progressing   Problem: Nutrition: Goal: Adequate nutrition will be maintained Outcome: Not Progressing   Problem: Elimination: Goal: Will not experience complications related to urinary retention Outcome: Not Progressing

## 2023-08-20 NOTE — Progress Notes (Signed)
 Co-ox w/ IABP at 1:2 43%. IABP increased back to 1:1  Pt now off nipride  gtt. MAPs marginal, ~65. Echo EF 25-30%, apex AK, no thrombus. RV ok. No MR. Small pericardial effusion   Repeat Swan #s w/ IABP 1:1  CVP 7 PAP 33/17 (240 PCWP 9  CO 2.91 CI 1.83 SVR 1649  Start DBA 2.5 and follow swan hemodynamics. Will increase to 5 mcg if additional support needed.   Caffie Shed, PA-C

## 2023-08-20 NOTE — TOC Initial Note (Signed)
 Transition of Care [facility]) - Initial/Assessment Note    Patient Details  Name: Karen Cameron MRN: [id number] Date of Birth: [birth year]  Transition of Care [facility]) CM/SW Contact:    Justina Delcia Czar, RN Phone Number: [phone] 08/20/2023, 4:07 PM  Clinical Narrative:                 Spoke to pt's husband at bedside. Discussed FMLA/STD with husband.  Husband has FMLA paperwork, and will call to arrange. Provided pt's husband with attending contact information to complete claim.  Will continue to follow for dc needs.   Expected Discharge Plan: Home/Self Care Barriers to Discharge: Continued Medical Work up   Patient Goals and CMS Choice      Expected Discharge Plan and Services   Discharge Planning Services: CM Consult     Prior Living Arrangements/Services     Activities of Daily Living   ADL Screening (condition at time of admission) Independently performs ADLs?: Yes (appropriate for developmental age) Is the patient deaf or have difficulty hearing?: No Does the patient have difficulty seeing, even when wearing glasses/contacts?: No Does the patient have difficulty concentrating, remembering, or making decisions?: No  Permission Sought/Granted   Emotional Assessment   Attitude/Demeanor/Rapport: Lethargic   Admission diagnosis:  ST elevation myocardial infarction (STEMI), unspecified artery (HCC) [I21.3] Chest pain, unspecified type [R07.9] Acute ST elevation myocardial infarction (STEMI) of anterior wall (HCC) [I21.09] Patient Active Problem List   Diagnosis Date Noted   Acute ST elevation myocardial infarction (STEMI) of anterolateral wall (HCC) 08/19/2023   Acute ST elevation myocardial infarction (STEMI) of anterior wall (HCC) 08/19/2023   Encounter for annual health examination 05/21/2023   Ganglion cyst of wrist, right 09/18/2022   COVID-19 vaccination declined 09/18/2022   Essential hypertension 11/08/2021   Elevated LDL cholesterol level  11/08/2021   Other allergic rhinitis 05/09/2018   Chronic cough 05/08/2018   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 [address], [address] [place] [address] Phone: [phone] Fax: [phone]     Social Drivers of Health (SDOH) Social History: SDOH Screenings   Food Insecurity: No Food Insecurity (08/20/2023)  Housing: Low Risk  (08/20/2023)  Transportation Needs: No Transportation Needs (08/20/2023)  Utilities: Not At Risk (08/20/2023)  Depression (PHQ2-9): Low Risk  (05/21/2023)  Tobacco Use: Low Risk  (08/19/2023)   SDOH Interventions:     Readmission Risk Interventions     No data to display

## 2023-08-21 ENCOUNTER — Encounter (HOSPITAL_COMMUNITY): Payer: Self-pay | Admitting: Cardiology

## 2023-08-21 LAB — BASIC METABOLIC PANEL WITH GFR
Anion gap: 7 (ref 5–15)
BUN: 17 mg/dL (ref 6–20)
CO2: 23 mmol/L (ref 22–32)
Calcium: 8.3 mg/dL — ABNORMAL LOW (ref 8.9–10.3)
Chloride: 102 mmol/L (ref 98–111)
Creatinine, Ser: 1.24 mg/dL — ABNORMAL HIGH (ref 0.44–1.00)
GFR, Estimated: 51 mL/min — ABNORMAL LOW (ref >60)
Glucose, Bld: 116 mg/dL — ABNORMAL HIGH (ref 70–99)
Potassium: 3.8 mmol/L (ref 3.5–5.1)
Sodium: 132 mmol/L — ABNORMAL LOW (ref 135–145)

## 2023-08-21 LAB — CBC
HCT: 31.8 % — ABNORMAL LOW (ref 36.0–46.0)
Hemoglobin: 10.5 g/dL — ABNORMAL LOW (ref 12.0–15.0)
MCH: 29.5 pg (ref 26.0–34.0)
MCHC: 33 g/dL (ref 30.0–36.0)
MCV: 89.3 fL (ref 80.0–100.0)
Platelets: 218 K/uL (ref 150–400)
RBC: 3.56 MIL/uL — ABNORMAL LOW (ref 3.87–5.11)
RDW: 13 % (ref 11.5–15.5)
WBC: 14 K/uL — ABNORMAL HIGH (ref 4.0–10.5)
nRBC: 0 % (ref 0.0–0.2)

## 2023-08-21 LAB — HEPARIN LEVEL (UNFRACTIONATED)
Heparin Unfractionated: <0.1 [IU]/mL — ABNORMAL LOW (ref 0.30–0.70)
Heparin Unfractionated: <0.1 [IU]/mL — ABNORMAL LOW (ref 0.30–0.70)
Heparin Unfractionated: 0.1 [IU]/mL — ABNORMAL LOW (ref 0.30–0.70)

## 2023-08-21 LAB — COOXEMETRY PANEL
Carboxyhemoglobin: 0.5 % (ref 0.5–1.5)
Carboxyhemoglobin: 1.6 % — ABNORMAL HIGH (ref 0.5–1.5)
Methemoglobin: <0.7 % (ref 0.0–1.5)
Methemoglobin: <0.7 % (ref 0.0–1.5)
O2 Saturation: 79 %
O2 Saturation: 60.7 %
Total hemoglobin: 10.9 g/dL — ABNORMAL LOW (ref 12.0–16.0)
Total hemoglobin: 9.7 g/dL — ABNORMAL LOW (ref 12.0–16.0)

## 2023-08-21 LAB — MAGNESIUM: Magnesium: 2.4 mg/dL (ref 1.7–2.4)

## 2023-08-21 LAB — CG4 I-STAT (LACTIC ACID): Lactic Acid, Venous: 1 mmol/L (ref 0.5–1.9)

## 2023-08-21 LAB — LIPOPROTEIN A (LPA): Lipoprotein (a): 165.8 nmol/L — ABNORMAL HIGH (ref <75.0)

## 2023-08-21 MED ORDER — LACTATED RINGERS IV BOLUS
500.0000 mL | Freq: Once | INTRAVENOUS | Status: AC
  Administered 2023-08-21: 500 mL via INTRAVENOUS

## 2023-08-21 MED ORDER — IVABRADINE 2.5 MG HALF TABLET
2.5000 mg | ORAL_TABLET | Freq: Two times a day (BID) | ORAL | Status: DC
  Administered 2023-08-21 – 2023-08-22 (×3): 2.5 mg via ORAL
  Filled 2023-08-21 (×3): qty 1

## 2023-08-21 MED ORDER — ENSURE PLUS HIGH PROTEIN PO LIQD
237.0000 mL | Freq: Two times a day (BID) | ORAL | Status: DC
  Administered 2023-08-21 – 2023-08-24 (×3): 237 mL via ORAL

## 2023-08-21 MED ORDER — SODIUM CHLORIDE 0.9% FLUSH: 10.0000 mL | INTRAVENOUS | Status: DC | PRN

## 2023-08-21 MED ORDER — LACTATED RINGERS IV BOLUS
1000.0000 mL | Freq: Once | INTRAVENOUS | Status: AC
  Administered 2023-08-21: 1000 mL via INTRAVENOUS

## 2023-08-21 MED ORDER — SODIUM CHLORIDE 0.9% IV SOLUTION: INTRAVENOUS | Status: DC | PRN

## 2023-08-21 MED ORDER — SODIUM CHLORIDE 0.9 % IV SOLN: INTRAVENOUS | Status: AC

## 2023-08-21 MED ORDER — POTASSIUM CHLORIDE 20 MEQ PO PACK: 20.0000 meq | PACK | Freq: Once | ORAL | Status: DC

## 2023-08-21 MED ORDER — POTASSIUM CHLORIDE 10 MEQ/100ML IV SOLN
10.0000 meq | INTRAVENOUS | Status: AC
  Administered 2023-08-21 (×2): 10 meq via INTRAVENOUS
  Filled 2023-08-21 (×2): qty 100

## 2023-08-21 MED ORDER — ORAL CARE MOUTH RINSE: 15.0000 mL | OROMUCOSAL | Status: DC | PRN

## 2023-08-21 MED ORDER — SODIUM CHLORIDE 0.9% FLUSH
10.0000 mL | Freq: Two times a day (BID) | INTRAVENOUS | Status: DC
  Administered 2023-08-22 – 2023-08-25 (×7): 10 mL

## 2023-08-21 NOTE — Progress Notes (Signed)
 PHARMACY - ANTICOAGULATION CONSULT NOTE  Pharmacy Consult for heparin  Indication: IABP  No Known Allergies   Patient Measurements: Height: 5' 2 (157.5 cm) Weight: 58.8 kg (129 lb 10.1 oz) IBW/kg (Calculated) : 50.1 HEPARIN  DW (KG): 59  Vital Signs: Temp: 99.7 F (37.6 C) (08/19 1600) Temp Source: Core (08/19 1600) BP: 90/67 (08/19 1600) Pulse Rate: 208 (08/19 0600)  Labs: Recent Labs    08/19/23 0930 08/19/23 0948 08/19/23 1143 08/19/23 1309 08/19/23 2009 08/20/23 0424 08/21/23 0404 08/21/23 0801 08/21/23 1457  HGB 15.1*   < > 14.3  --   --  12.0 10.5*  --   --   HCT 45.3   < > 42.0  --   --  36.3 31.8*  --   --   PLT 444*  --   --   --   --  326 218  --   --   APTT  --   --   --  >200*  --   --   --   --   --   LABPROT  --   --   --  14.8  --   --   --   --   --   INR  --   --   --  1.1  --   --   --   --   --   HEPARINUNFRC  --   --   --   --    < > 0.22* <0.10* <0.10* 0.10*  CREATININE 1.18*   < >  --  1.14*  --  1.29* 1.24*  --   --   TROPONINIHS >24,000*  --   --   --   --   --   --   --   --    < > = values in this interval not displayed.    Estimated Creatinine Clearance: 40.5 mL/min (A) (by C-G formula based on SCr of 1.24 mg/dL (H)).  Assessment: 73 yof presenting with STEMI - finding occlusion in proxLAD now with DES, IABP placed to assist with perfusion. No AC PTA.  Heparin  level continues subtherapeutic (0.1) on infusion at 650 units/hr. No issues with line or bleeding reported per RN.  Goal of Therapy:  Heparin  level 0.2-0.5 units/ml Monitor platelets by anticoagulation protocol: Yes   Plan:  Increase heparin  to 750 units/hr Check 6 hour heparin  level  Vito Ralph, PharmD, BCPS Please see amion for complete clinical pharmacist phone list 08/21/2023 4:21 PM

## 2023-08-21 NOTE — Progress Notes (Signed)
 PHARMACY - ANTICOAGULATION CONSULT NOTE  Pharmacy Consult for heparin  Indication: IABP  No Known Allergies   Patient Measurements: Height: 5' 2 (157.5 cm) Weight: 58.8 kg (129 lb 10.1 oz) IBW/kg (Calculated) : 50.1 HEPARIN  DW (KG): 59  Vital Signs: Temp: 99 F (37.2 C) (08/19 0809) Temp Source: Core (08/19 0800) BP: 112/90 (08/19 0800) Pulse Rate: 203 (08/19 0809)  Labs: Recent Labs    08/19/23 0930 08/19/23 0948 08/19/23 1143 08/19/23 1309 08/19/23 2009 08/20/23 0424 08/21/23 0404 08/21/23 0801  HGB 15.1*   < > 14.3  --   --  12.0 10.5*  --   HCT 45.3   < > 42.0  --   --  36.3 31.8*  --   PLT 444*  --   --   --   --  326 218  --   APTT  --   --   --  >200*  --   --   --   --   LABPROT  --   --   --  14.8  --   --   --   --   INR  --   --   --  1.1  --   --   --   --   HEPARINUNFRC  --   --   --   --    < > 0.22* <0.10* <0.10*  CREATININE 1.18*   < >  --  1.14*  --  1.29* 1.24*  --   TROPONINIHS >24,000*  --   --   --   --   --   --   --    < > = values in this interval not displayed.    Estimated Creatinine Clearance: 40.5 mL/min (A) (by C-G formula based on SCr of 1.24 mg/dL (H)).   Medical History: Past Medical History:  Diagnosis Date   Acute ST elevation myocardial infarction (STEMI) of anterolateral wall (HCC) 08/19/2023   Allergy     Encounter for annual health examination 05/21/2023   Hypertension    Nausea & vomiting    SAB (spontaneous abortion)    X2    Medications:  Scheduled:   aspirin   81 mg Oral Daily   atorvastatin   80 mg Oral Daily   Chlorhexidine  Gluconate Cloth  6 each Topical Daily   digoxin   0.125 mg Oral Daily   feeding supplement  237 mL Oral BID BM   ivabradine   2.5 mg Oral BID WC   sacubitril -valsartan   1 tablet Oral BID   sodium chloride  flush  3 mL Intravenous Q12H   spironolactone   12.5 mg Oral Daily   ticagrelor   90 mg Oral BID    Assessment: 36 yof presenting with STEMI - finding occlusion in proxLAD now with DES,  IABP placed to assist with perfusion. No AC PTA.  Heparin  level < 0.1 is subtherapeutic with heparin  running at 500 units/hr. Hgb (10.5) and PLTs (218) are are slightly downtrending. Per RN, no report of pauses, issues with the line, or signs of bleeding.   Ordered repeat heparin  level which also returned at < 0.1.   Goal of Therapy:  Heparin  level 0.2-0.5 units/ml Monitor platelets by anticoagulation protocol: Yes   Plan:  Increase heparin  to 650 units/hr Check 6 hour heparin  level Monitor daily heparin  level, CBC, and signs/symptoms of bleeding  Thank you for allowing pharmacy to be a part of this patient's care.   Nidia Schaffer, PharmD PGY2 Cardiology Pharmacy Resident  Please check AMION for  all [facility] Pharmacy phone numbers After 10:00 PM, call Main Pharmacy [phone] 08/21/2023 8:36 AM

## 2023-08-21 NOTE — Progress Notes (Addendum)
 Advanced Heart Failure Rounding Note  Cardiologist: None  Chief Complaint: Chest pain  Patient Profile   Karen Cameron is a 58 y.o. female with a history of HTN who presented with late presentation of anterior STEMI, subsequently developing cardiogenic shock requiring IABP placement.   Cardiac Studies   LHC: 1V CAD, 100% pLAD, s/p PCI + DES, LVEDP 22, EF 25%  RHC:  HEMODYNAMICS: RA:       2 mmHg (mean) RV:       33/1, 2 mmHg PA:       33/14 mmHg (20 mean) PCWP: 12 mmHg (mean)                                      Estimated Fick CO/CI   3.03L/min, 1.9L/min/m2         Thermodilution CO/CI   2.78L/min, 1.75L/min/m2                                      TPG  8  mmHg                                                 PVR  2.8 Woelfel Units  PAPi  9.5       IMPRESSION: Late presenting anterior MI complicated by MI shock s/p IABP placement Low filling pressures Normal PA pressure, elevated PVR a function of reduced cardiac output Severely reduced cardiac output by TD Severely elevated SVR of 2964 dynes/sec*cm-5  2D Echo: EF 25-30%, RV ok. Apex AK. No LV thrombus    Subjective:    Continues w/ IABP at 1:2 + DBA 2. Heparin  gtt. IABP groin site stable w/ 2+ DP.  Co-ox 79%.    Swan  #s CVP 1-2 PAP 30/15 CO 4.7 CI 2.9 SVR 1400  MAP 80   Lactic acid cleared, 1.0   Tachycardic, sinus 120s at rest.   Denies CP. No dyspnea. Looks depressed. Not eating and drinking much.   Objective:   Weight Range: 58.8 kg Body mass index is 23.71 kg/m.   Vital Signs:   Temp:  [99.1 F (37.3 C)-100.2 F (37.9 C)] 99.9 F (37.7 C) (08/19 0700) Pulse Rate:  [63-284] 99 (08/19 0700) Resp:  [11-31] 22 (08/19 0700) BP: (70-140)/(42-104) 92/71 (08/19 0700) SpO2:  [90 %-100 %] 100 % (08/19 0700) Weight:  [58.8 kg] 58.8 kg (08/19 0500) Last BM Date : 08/19/23  Weight change: Filed Weights   08/19/23 0924 08/19/23 1300 08/21/23 0500  Weight: 59 kg 58.7 kg 58.8 kg     Intake/Output:   Intake/Output Summary (Last 24 hours) at 08/21/2023 0723 Last data filed at 08/21/2023 0700 Gross per 24 hour  Intake 343.21 ml  Output 421 ml  Net -77.79 ml      Physical Exam    CVP 1-2 GENERAL: fatigued, depressed appearing Lungs- clear  CARDIAC:  JVP: not elevated, Reg rhythm, tachy rate, + IABP sounds   ABDOMEN: Soft, non-tender, non-distended.  EXTREMITIES: Warm and well perfused. Rt groin IABP stable. No oozing  NEUROLOGIC: No obvious FND GU: + foley   Telemetry   Sinus tach 120s-140s personally review   Medications:     Scheduled  Medications:  aspirin   81 mg Oral Daily   atorvastatin   80 mg Oral Daily   Chlorhexidine  Gluconate Cloth  6 each Topical Daily   digoxin   0.125 mg Oral Daily   sacubitril -valsartan   1 tablet Oral BID   sodium chloride  flush  3 mL Intravenous Q12H   spironolactone   12.5 mg Oral Daily   ticagrelor   90 mg Oral BID    Infusions:  DOBUTamine  2 mcg/kg/min (08/21/23 0700)   heparin  500 Units/hr (08/21/23 0700)   potassium chloride  100 mL/hr at 08/21/23 0700    PRN Medications: acetaminophen , guaiFENesin -dextromethorphan , ondansetron  (ZOFRAN ) IV, prochlorperazine , sodium chloride  flush     Assessment/Plan   1. Cardiogenic shock: Due to late presentation of anterior STEMI, severely reduced EF on LV gram. Echo EF 25-30%, RV ok Apex AK, no LV thrombus  - IABP at 1:2, remains tachycardiac. CI 2.9, Co-ox 79%. SVR 1400  - CVP 1-2  - give volume back, 500 cc LR bolus x 1 - continue Entresto  24-26 mg bid - continue spiro 12.5 mg daily  - continue digoxin  0.125 mg  - start corlanor  2.5 mg bid  - reassess at noon. If hemodynamics stable, will try weaning IABP further   2. Anterior STEMI/CAD: Prior history of HTN, minimal other risk factors. Cath w/ 1V occlusive disease, 100%, pLAD. S/p successful PCI, complicated by shock as above. - CP free  - Continue aspirin  + ticagrelor  - Atorvastatin  80 mg daily  - no ?  blocker w/ shock  - Heparin  gtt while on IABP  3. HLD: LDL 124. - Statin as above - LDL Goal < 55   4. Shock Liver - improving - AST 751>>472 - ALT 55>>46  - continue to trend   5. Hypertension  - MAPs 80s - continue HF GDMT titration per above    6. AKI  - baseline SCr ~1.0  - SCr 1.3 in setting of CS, stable today  - follow BMP   CRITICAL CARE Performed by: Caffie Shed   Total critical care time: 15 minutes  Critical care time was exclusive of separately billable procedures and treating other patients.  Critical care was necessary to treat or prevent imminent or life-threatening deterioration.  Critical care was time spent personally by me on the following activities: development of treatment plan with patient and/or surrogate as well as nursing, discussions with consultants, evaluation of patient's response to treatment, examination of patient, obtaining history from patient or surrogate, ordering and performing treatments and interventions, ordering and review of laboratory studies, ordering and review of radiographic studies, pulse oximetry and re-evaluation of patient's condition.   Length of Stay: 2  Caffie Shed, PA-C  08/21/2023, 7:23 AM  Advanced Heart Failure Team Pager [phone] (M-F; 7a - 5p)  Please contact [facility] for night-coverage after hours (5p -7a ) and weekends on [email or web address]  Agree with above  Remains on IABP 1:2 and DBA 2. Hemodynamics improved but still tachycardiac. CVP low.   General:  Lying in bed No resp difficulty HEENT: normal Neck: supple. no JVD. Carotids 2+ bilat; no bruits. No lymphadenopathy or thryomegaly appreciated. Cor: Reg tachy Lungs: clear Abdomen: soft, nontender, nondistended. No hepatosplenomegaly. No bruits or masses. Good bowel sounds. Extremities: no cyanosis, clubbing, rash, edema RFA IABP and RFV swan  Neuro: alert & orientedx3, cranial nerves grossly intact. moves all 4 extremities w/o difficulty.  Affect pleasant  Remains very tenuous. Will continue to adjust regimen. Given 500 cc LR back. Start ivabradine .   We discussed that  if unable to wean IABP may need to consider slower wean with Impella 5.5  CRITICAL CARE Performed by: Khadar Monger  Total critical care time: 45 minutes  Critical care time was exclusive of separately billable procedures and treating other patients.  Critical care was necessary to treat or prevent imminent or life-threatening deterioration.  Critical care was time spent personally by me (independent of midlevel providers or residents) on the following activities: development of treatment plan with patient and/or surrogate as well as nursing, discussions with consultants, evaluation of patient's response to treatment, examination of patient, obtaining history from patient or surrogate, ordering and performing treatments and interventions, ordering and review of laboratory studies, ordering and review of radiographic studies, pulse oximetry and re-evaluation of patient's condition.  Toribio Fuel, MD  9:42 AM

## 2023-08-22 ENCOUNTER — Telehealth (HOSPITAL_COMMUNITY): Payer: Self-pay | Admitting: Pharmacy Technician

## 2023-08-22 ENCOUNTER — Other Ambulatory Visit (HOSPITAL_COMMUNITY): Payer: Self-pay

## 2023-08-22 ENCOUNTER — Other Ambulatory Visit: Payer: Self-pay

## 2023-08-22 LAB — CBC
HCT: 29.6 % — ABNORMAL LOW (ref 36.0–46.0)
Hemoglobin: 9.6 g/dL — ABNORMAL LOW (ref 12.0–15.0)
MCH: 29.6 pg (ref 26.0–34.0)
MCHC: 32.4 g/dL (ref 30.0–36.0)
MCV: 91.4 fL (ref 80.0–100.0)
Platelets: 212 K/uL (ref 150–400)
RBC: 3.24 MIL/uL — ABNORMAL LOW (ref 3.87–5.11)
RDW: 13.1 % (ref 11.5–15.5)
WBC: 12.4 K/uL — ABNORMAL HIGH (ref 4.0–10.5)
nRBC: 0 % (ref 0.0–0.2)

## 2023-08-22 LAB — BASIC METABOLIC PANEL WITH GFR
Anion gap: 8 (ref 5–15)
BUN: 15 mg/dL (ref 6–20)
CO2: 22 mmol/L (ref 22–32)
Calcium: 7.9 mg/dL — ABNORMAL LOW (ref 8.9–10.3)
Chloride: 106 mmol/L (ref 98–111)
Creatinine, Ser: 1.03 mg/dL — ABNORMAL HIGH (ref 0.44–1.00)
GFR, Estimated: >60 mL/min (ref >60)
Glucose, Bld: 92 mg/dL (ref 70–99)
Potassium: 3.8 mmol/L (ref 3.5–5.1)
Sodium: 136 mmol/L (ref 135–145)

## 2023-08-22 LAB — COOXEMETRY PANEL
Carboxyhemoglobin: 1.7 % — ABNORMAL HIGH (ref 0.5–1.5)
Carboxyhemoglobin: 2.2 % — ABNORMAL HIGH (ref 0.5–1.5)
Methemoglobin: <0.7 % (ref 0.0–1.5)
Methemoglobin: 0.7 % (ref 0.0–1.5)
O2 Saturation: 63.9 %
O2 Saturation: 61.4 %
Total hemoglobin: 9.7 g/dL — ABNORMAL LOW (ref 12.0–16.0)
Total hemoglobin: 10.6 g/dL — ABNORMAL LOW (ref 12.0–16.0)

## 2023-08-22 LAB — MAGNESIUM: Magnesium: 2.2 mg/dL (ref 1.7–2.4)

## 2023-08-22 LAB — HEPARIN LEVEL (UNFRACTIONATED): Heparin Unfractionated: 0.18 [IU]/mL — ABNORMAL LOW (ref 0.30–0.70)

## 2023-08-22 MED ORDER — NOREPINEPHRINE 4 MG/250ML-% IV SOLN: 0.0000 ug/min | INTRAVENOUS | Status: DC

## 2023-08-22 MED ORDER — HYDRALAZINE HCL 10 MG PO TABS
10.0000 mg | ORAL_TABLET | Freq: Once | ORAL | Status: AC
  Administered 2023-08-22: 10 mg via ORAL
  Filled 2023-08-22: qty 1

## 2023-08-22 MED ORDER — POTASSIUM CHLORIDE 10 MEQ/100ML IV SOLN: 10.0000 meq | INTRAVENOUS | Status: DC

## 2023-08-22 MED ORDER — EPINEPHRINE HCL 5 MG/250ML IV SOLN IN NS: 0.5000 ug/min | INTRAVENOUS | Status: DC

## 2023-08-22 MED ORDER — SODIUM CHLORIDE 0.9% FLUSH
10.0000 mL | Freq: Two times a day (BID) | INTRAVENOUS | Status: DC
  Administered 2023-08-22 – 2023-08-25 (×7): 10 mL

## 2023-08-22 MED ORDER — POTASSIUM CHLORIDE 10 MEQ/50ML IV SOLN
10.0000 meq | INTRAVENOUS | Status: AC
  Administered 2023-08-22: 10 meq via INTRAVENOUS
  Filled 2023-08-22: qty 50

## 2023-08-22 MED ORDER — IVABRADINE HCL 5 MG PO TABS
5.0000 mg | ORAL_TABLET | Freq: Two times a day (BID) | ORAL | Status: DC
  Administered 2023-08-22 – 2023-08-25 (×6): 5 mg via ORAL
  Filled 2023-08-22 (×10): qty 1

## 2023-08-22 MED ORDER — SODIUM CHLORIDE 0.9% FLUSH: 10.0000 mL | INTRAVENOUS | Status: DC | PRN

## 2023-08-22 MED ORDER — POTASSIUM CHLORIDE 20 MEQ PO PACK
20.0000 meq | PACK | Freq: Once | ORAL | Status: AC
  Administered 2023-08-22: 20 meq via ORAL
  Filled 2023-08-22: qty 1

## 2023-08-22 NOTE — Progress Notes (Signed)
 Peripherally Inserted Central Catheter Placement  The IV Nurse has discussed with the patient and/or persons authorized to consent for the patient, the purpose of this procedure and the potential benefits and risks involved with this procedure.  The benefits include less needle sticks, lab draws from the catheter, and the patient may be discharged home with the catheter. Risks include, but not limited to, infection, bleeding, blood clot (thrombus formation), and puncture of an artery; nerve damage and irregular heartbeat and possibility to perform a PICC exchange if needed/ordered by physician.  Alternatives to this procedure were also discussed.  Bard Power PICC patient education guide, fact sheet on infection prevention and patient information card has been provided to patient /or left at bedside.    PICC Placement Documentation  PICC Triple Lumen 08/22/23 Right Brachial 35 cm 0 cm (Active)  Indication for Insertion or Continuance of Line Vasoactive infusions 08/22/23 1149  Exposed Catheter (cm) 0 cm 08/22/23 1149  Site Assessment Clean, Dry, Intact 08/22/23 1149  Lumen #1 Status Flushed;Saline locked;Blood return noted 08/22/23 1149  Lumen #2 Status Flushed;Saline locked;Blood return noted 08/22/23 1149  Lumen #3 Status Flushed;Saline locked;Blood return noted 08/22/23 1149  Dressing Type Transparent;Securing device 08/22/23 1149  Dressing Status Antimicrobial disc/dressing in place;Clean, Dry, Intact 08/22/23 1149  Line Care Connections checked and tightened 08/22/23 1149  Line Adjustment (NICU/IV Team Only) No 08/22/23 1149  Dressing Intervention New dressing;Adhesive placed at insertion site (IV team only) 08/22/23 1149  Dressing Change Due 08/29/23 08/22/23 1149    Husband signed consent at bedside   Renaee Neville Skillern 08/22/2023, 11:50 AM

## 2023-08-22 NOTE — Telephone Encounter (Signed)
 Patient Product/process development scientist completed.    The patient is insured through [facility]. Patient has [employer], may use a copay card, and/or apply for patient assistance if available.    Ran test claim for ivabradine  (Corlanor ) 5 mg and the current 30 day co-pay is $5.00.   This test claim was processed through [facility]- copay amounts may vary at other pharmacies due to pharmacy/plan contracts, or as the patient moves through the different stages of their insurance plan.     Reyes Sharps, CPHT Pharmacy Technician III Certified Patient Advocate [facility] Patient Advocate Team Direct Number: [phone]  Fax: [phone]

## 2023-08-22 NOTE — Progress Notes (Signed)
 PHARMACY - ANTICOAGULATION CONSULT NOTE  Pharmacy Consult for heparin  Indication: IABP  No Known Allergies   Patient Measurements: Height: 5' 2 (157.5 cm) Weight: 58.8 kg (129 lb 10.1 oz) IBW/kg (Calculated) : 50.1 HEPARIN  DW (KG): 59  Vital Signs: Temp: 99.3 F (37.4 C) (08/20 0200) Temp Source: Core (08/19 2015) BP: 143/89 (08/20 0200) Pulse Rate: 68 (08/20 0200)  Labs: Recent Labs    08/19/23 0930 08/19/23 0948 08/19/23 1143 08/19/23 1309 08/19/23 2009 08/20/23 0424 08/21/23 0404 08/21/23 0801 08/21/23 1457 08/22/23 0159  HGB 15.1*   < > 14.3  --   --  12.0 10.5*  --   --   --   HCT 45.3   < > 42.0  --   --  36.3 31.8*  --   --   --   PLT 444*  --   --   --   --  326 218  --   --   --   APTT  --   --   --  >200*  --   --   --   --   --   --   LABPROT  --   --   --  14.8  --   --   --   --   --   --   INR  --   --   --  1.1  --   --   --   --   --   --   HEPARINUNFRC  --   --   --   --    < > 0.22* <0.10* <0.10* 0.10* 0.18*  CREATININE 1.18*   < >  --  1.14*  --  1.29* 1.24*  --   --   --   TROPONINIHS >24,000*  --   --   --   --   --   --   --   --   --    < > = values in this interval not displayed.    Estimated Creatinine Clearance: 40.5 mL/min (A) (by C-G formula based on SCr of 1.24 mg/dL (H)).  Assessment: 54 yof presenting with STEMI - finding occlusion in proxLAD now with DES, IABP placed to assist with perfusion. No AC PTA.  Heparin  level continues subtherapeutic (0.1) on infusion at 650 units/hr. No issues with line or bleeding reported per RN.  8/20 AM - heparin  level 0.18 below goal on 750 units/hr. No issues / bleeding per RN.  Goal of Therapy:  Heparin  level 0.2-0.5 units/ml Monitor platelets by anticoagulation protocol: Yes   Plan:  Increase heparin  gtt to 850 units/hr Check 6 hour heparin  level  Maurilio Fila, PharmD Clinical Pharmacist 08/22/2023  2:56 AM

## 2023-08-22 NOTE — TOC Progression Note (Addendum)
 Transition of Care [facility]) - Progression Note    Patient Details  Name: Karen Cameron MRN: [id number] Date of Birth: February 14, 1965  [facility]) CM/SW Contact  Denitra Donaghey M, RN Phone Number: 08/22/2023, 10:50am  Clinical Narrative:    Met with spouse at bedside to continue discussions regarding FMLA/STD.  He states that he has contacted [employer] and plans to call the [employer] later today.  He is appreciative of my visit; Inpatient Case Manager will offer support as needed.   Expected Discharge Plan: Home/Self Care Barriers to Discharge: Continued Medical Work up               Expected Discharge Plan and Services   Discharge Planning Services: CM Consult                                           Social Drivers of Health (SDOH) Interventions SDOH Screenings   Food Insecurity: No Food Insecurity (08/20/2023)  Housing: Low Risk  (08/20/2023)  Transportation Needs: No Transportation Needs (08/20/2023)  Utilities: Not At Risk (08/20/2023)  Depression (PHQ2-9): Low Risk  (05/21/2023)  Tobacco Use: Low Risk  (08/19/2023)    Readmission Risk Interventions     No data to display         Mliss MICAEL Fass, RN, BSN  Trauma/Neuro ICU Case Manager [phone]

## 2023-08-22 NOTE — Progress Notes (Signed)
 9Fr IABP pulled @ 9:30. Manual pressure for 45 mins. Hemostasis achieved. NO bleeding, no hematoma, no swelling, site level 0. Gauze and Tegaderm applied to site. PT +3. Explained bedrest rules to pt

## 2023-08-22 NOTE — Progress Notes (Addendum)
 Advanced Heart Failure Rounding Note  Cardiologist: None  Chief Complaint: Chest pain  Patient Profile   Karen Cameron is a 58 y.o. female with a history of HTN who presented with late presentation of anterior STEMI, subsequently developing cardiogenic shock requiring IABP placement.   Cardiac Studies   LHC: 1V CAD, 100% pLAD, s/p PCI + DES, LVEDP 22, EF 25%  RHC:  HEMODYNAMICS: RA:       2 mmHg (mean) RV:       33/1, 2 mmHg PA:       33/14 mmHg (20 mean) PCWP: 12 mmHg (mean)                                      Estimated Fick CO/CI   3.03L/min, 1.9L/min/m2         Thermodilution CO/CI   2.78L/min, 1.75L/min/m2                                      TPG  8  mmHg                                                 PVR  2.8 Kruck Units  PAPi  9.5       IMPRESSION: Late presenting anterior MI complicated by MI shock s/p IABP placement Low filling pressures Normal PA pressure, elevated PVR a function of reduced cardiac output Severely reduced cardiac output by TD Severely elevated SVR of 2964 dynes/sec*cm-5  2D Echo: EF 25-30%, RV ok. Apex AK. No LV thrombus    Subjective:    IABP weanned down to 1:2 yesterday and DBA stopped. Got total of 1500 fluid bolues.   IABP continues at 1:2. Co-ox 64%.   MAP 78. Remains tachy, sinus 110s   Swan  #s CVP 6 PAP 32/14 CO 4.63 CI 2.91 SVR 1244  SCr 1.3>>1.0   Still feels tired but denies CP and dyspnea. No pain at IABP femoral site. Denies LE numbness/tingling.   Husband present at bedside.    Objective:   Weight Range: 58.8 kg Body mass index is 23.71 kg/m.   Vital Signs:   Temp:  [99 F (37.2 C)-100.2 F (37.9 C)] 99.5 F (37.5 C) (08/20 0715) Pulse Rate:  [53-205] 189 (08/20 0715) Resp:  [0-44] 34 (08/20 0715) BP: (85-171)/(55-101) 118/70 (08/20 0700) SpO2:  [91 %-100 %] 95 % (08/20 0715) Last BM Date : 08/19/23  Weight change: Filed Weights   08/19/23 0924 08/19/23 1300 08/21/23 0500  Weight: 59 kg  58.7 kg 58.8 kg    Intake/Output:   Intake/Output Summary (Last 24 hours) at 08/22/2023 0719 Last data filed at 08/22/2023 0700 Gross per 24 hour  Intake 2360.79 ml  Output 583 ml  Net 1777.79 ml      Physical Exam   CVP 6  GENERAL: fatigued appearing, NAD Lungs- clear  CARDIAC:  JVP: 6 cm          Normal regular rhythm, tachy rate. No MRG. Pulses 2+. No LE edema  ABDOMEN: Soft, non-tender, non-distended.  EXTREMITIES: Warm and well perfused. RFA IABP stable  NEUROLOGIC: No obvious FND GU: + foley    Telemetry  Sinus tach 110s,  personally review   Medications:     Scheduled Medications:  aspirin   81 mg Oral Daily   atorvastatin   80 mg Oral Daily   Chlorhexidine  Gluconate Cloth  6 each Topical Daily   digoxin   0.125 mg Oral Daily   feeding supplement  237 mL Oral BID BM   ivabradine   2.5 mg Oral BID WC   sacubitril -valsartan   1 tablet Oral BID   sodium chloride  flush  10-40 mL Intracatheter Q12H   spironolactone   12.5 mg Oral Daily   ticagrelor   90 mg Oral BID    Infusions:  sodium chloride  10 mL/hr at 08/22/23 0700   heparin  850 Units/hr (08/22/23 0700)   potassium chloride       PRN Medications: sodium chloride , acetaminophen , guaiFENesin -dextromethorphan , ondansetron  (ZOFRAN ) IV, mouth rinse, prochlorperazine , sodium chloride  flush     Assessment/Plan   1. Cardiogenic shock: Due to late presentation of anterior STEMI, severely reduced EF on LV gram. Echo EF 25-30%, RV ok Apex AK, no LV thrombus  - IABP at 1:2. Now off DBA. Co-ox 64%, CI 2.9, CVP 6, PAP 32/14, SVR 1244 - trial IABP wean to 1:3 and push GDMT - continue Entresto  24-26 mg bid - continue spiro 12.5 mg daily  - continue digoxin  0.125 mg  - increase corlanor  to 5 mg bid  - re-shoot Swan #s post IABP wean. If hemodynamics ok, will plan to pull pump today  - no diuretics or SGLT2i currently w/ low CVP   2. Anterior STEMI/CAD: Prior history of HTN, minimal other risk factors. Cath w/ 1V  occlusive disease, 100%, pLAD. S/p successful PCI, complicated by shock as above. - stable w/o CP  - Continue aspirin  + ticagrelor  - Atorvastatin  80 mg daily  - no ? blocker w/ shock  - Heparin  gtt while on IABP  3. HLD: LDL 124 - Statin as above - LDL Goal < 55   4. Shock Liver - improving - AST 751>>472 - ALT 55>>46  - continue to trend   5. Hypertension  - MAPs upper 70s-80s  - continue HF GDMT titration per above    6. AKI  - improving  - baseline SCr ~1.0  - SCr 1.3 >>1.03 today  - follow BMP   CRITICAL CARE Performed by: Caffie Shed   Total critical care time: 15 minutes  Critical care time was exclusive of separately billable procedures and treating other patients.  Critical care was necessary to treat or prevent imminent or life-threatening deterioration.  Critical care was time spent personally by me on the following activities: development of treatment plan with patient and/or surrogate as well as nursing, discussions with consultants, evaluation of patient's response to treatment, examination of patient, obtaining history from patient or surrogate, ordering and performing treatments and interventions, ordering and review of laboratory studies, ordering and review of radiographic studies, pulse oximetry and re-evaluation of patient's condition.  Length of Stay: [address] Shed RIGGERS  08/22/2023, 7:19 AM  Advanced Heart Failure Team Pager [phone] (M-F; 7a - 5p)  Please contact [facility] for night-coverage after hours (5p -7a ) and weekends on [email or web address]  Agree with above.   Now on IABP 1:2. Off DBA. Hemodynamics improved with IVF yesterday.   Denies CP or SOB. On heparin . No bleeding  Remains tachycardic.   General:  Lying in bed. No resp difficulty HEENT: normal Neck: supple. JVP 6-7  Cor: PReg tachy Lungs: clear Abdomen: soft, nontender, nondistended. No hepatosplenomegaly. No bruits or masses.  Good bowel sounds. Extremities: no  cyanosis, clubbing, rash, edema RFA IABP RFV swan Neuro: alert & orientedx3, cranial nerves grossly intact. moves all 4 extremities w/o difficulty. Affect pleasant  Hemodynamics improved with IVF. I paused IABP and hemodynamics actually improved. Will hold heparin . Pull IABP when ACT < 150s Keep swan to make sure stable after IABP out. Once stable can pull swan. Place PICC.   CRITICAL CARE Performed by: Cherrie Sieving  Total critical care time: 40 minutes  Critical care time was exclusive of separately billable procedures and treating other patients.  Critical care was necessary to treat or prevent imminent or life-threatening deterioration.  Critical care was time spent personally by me (independent of midlevel providers or residents) on the following activities: development of treatment plan with patient and/or surrogate as well as nursing, discussions with consultants, evaluation of patient's response to treatment, examination of patient, obtaining history from patient or surrogate, ordering and performing treatments and interventions, ordering and review of laboratory studies, ordering and review of radiographic studies, pulse oximetry and re-evaluation of patient's condition.  Sieving Cherrie, MD  1:02 PM

## 2023-08-23 ENCOUNTER — Ambulatory Visit: Admitting: Internal Medicine

## 2023-08-23 LAB — CBC
HCT: 30.9 % — ABNORMAL LOW (ref 36.0–46.0)
Hemoglobin: 10.2 g/dL — ABNORMAL LOW (ref 12.0–15.0)
MCH: 30 pg (ref 26.0–34.0)
MCHC: 33 g/dL (ref 30.0–36.0)
MCV: 90.9 fL (ref 80.0–100.0)
Platelets: 260 K/uL (ref 150–400)
RBC: 3.4 MIL/uL — ABNORMAL LOW (ref 3.87–5.11)
RDW: 12.9 % (ref 11.5–15.5)
WBC: 10.9 K/uL — ABNORMAL HIGH (ref 4.0–10.5)
nRBC: 0.2 % (ref 0.0–0.2)

## 2023-08-23 LAB — BASIC METABOLIC PANEL WITH GFR
Anion gap: 8 (ref 5–15)
BUN: 9 mg/dL (ref 6–20)
CO2: 23 mmol/L (ref 22–32)
Calcium: 8.1 mg/dL — ABNORMAL LOW (ref 8.9–10.3)
Chloride: 102 mmol/L (ref 98–111)
Creatinine, Ser: 0.86 mg/dL (ref 0.44–1.00)
GFR, Estimated: >60 mL/min (ref >60)
Glucose, Bld: 104 mg/dL — ABNORMAL HIGH (ref 70–99)
Potassium: 3.8 mmol/L (ref 3.5–5.1)
Sodium: 133 mmol/L — ABNORMAL LOW (ref 135–145)

## 2023-08-23 LAB — MAGNESIUM: Magnesium: 2 mg/dL (ref 1.7–2.4)

## 2023-08-23 LAB — COOXEMETRY PANEL
Carboxyhemoglobin: 1.1 % (ref 0.5–1.5)
Methemoglobin: <0.7 % (ref 0.0–1.5)
O2 Saturation: 56.5 %
Total hemoglobin: 11.1 g/dL — ABNORMAL LOW (ref 12.0–16.0)

## 2023-08-23 LAB — POCT ACTIVATED CLOTTING TIME: Activated Clotting Time: 129 s

## 2023-08-23 MED ORDER — METOPROLOL SUCCINATE ER 25 MG PO TB24
25.0000 mg | ORAL_TABLET | Freq: Every day | ORAL | Status: DC
  Administered 2023-08-23 – 2023-08-25 (×3): 25 mg via ORAL
  Filled 2023-08-23 (×3): qty 1

## 2023-08-23 MED ORDER — ENOXAPARIN SODIUM 40 MG/0.4ML IJ SOSY
40.0000 mg | PREFILLED_SYRINGE | INTRAMUSCULAR | Status: DC
  Administered 2023-08-23 – 2023-08-25 (×3): 40 mg via SUBCUTANEOUS
  Filled 2023-08-23 (×3): qty 0.4

## 2023-08-23 MED ORDER — POTASSIUM CHLORIDE 20 MEQ PO PACK
20.0000 meq | PACK | Freq: Once | ORAL | Status: AC
  Administered 2023-08-23: 20 meq via ORAL
  Filled 2023-08-23: qty 1

## 2023-08-23 MED FILL — Verapamil HCl IV Soln 2.5 MG/ML: INTRAVENOUS | Qty: 2 | Status: AC

## 2023-08-23 NOTE — Progress Notes (Addendum)
 CARDIAC REHAB PHASE I   PRE:  Rate/Rhythm: 114 ST  BP:  Sitting: 114/82      SpO2: 94 RA  MODE:  Ambulation: 170 ft    POST:  Rate/Rhythm: 121 ST  BP:  Sitting: 107/70      SpO2: 99 RA  Pt ambulated with standby assist using RW in the hallway. Pt c/o tiredness but otherwise asymptomatic. Pt was helped to chair and left with call button in reach. Will return for another walk if time allows.   Pt was educated on STEMI, stent card, stent location, Antiplatelet and ASA use, wt restrictions, no baths/daily wash-ups, s/s of infection, ex guidelines, s/s to stop exercising, NTG use and calling 911, heart healthy diet, risk factors and CRPII. Pt received MI book and materials on exercise, diet, and CRPII. Will refer to [facility].   Pt is interested in CR at [facility] MS, ACSM-CEP  08/23/2023 9:49 AM

## 2023-08-23 NOTE — Discharge Summary (Signed)
 Advanced Heart Failure Team  Discharge Summary   Patient ID: Karen Cameron MRN: [id number], DOB/AGE: May 05, 1965 58 y.o. Admit date: 08/19/2023 D/C date:     08/25/2023    HF MD: Dr. Zenaida   Primary Discharge Diagnoses:  CAD with Late Presenting Anterior STEMI, CAD s/p PCI to LAD Acute MI Cardiogenic Shock  Acute Systolic Heart Failure, Ischemic Cardiomyopathy  NSVT  HLD   Hospital Course:   58 y/o female w/ h/o HTN but no other cardiac history who presented to the ED on 08/19/23 w/ delayed presentation MI. Had developed CP 10 hrs prior to ED presentation. EKG showed anterolateral STEMI. Given ongoing active CP, she was taken for emergent cath which showed severe 1V CAD w/ acutely occluded LAD with significant thrombotic lesion, treated w/ thrombectomy and DES.  LVEDP 22, EF severely reduced by LV gram, at 25%. She developed CS w/ hypotension in the cath lab and a lactic acid of 3.8, requiring placement of an IABP. Subsequent RHC demonstrated low filling pressures and severely reduced cardiac output by TD and severely elevated SVR (RA 2, PA 33/14, PCW 12, TD CI 1.75, PAPi 9.5, SVR 2964 dynes/sec*cm-5). She was started on Nipride  gtt and admitted to the CCU where she was continued on IABP + chemical inotrope support w/ DBA. Also required IVF boluses w/ improvement in hemodynamics. Formal complete 2D Echo showed EF 25-30%, RV ok. Apex AK. No LV thrombus. She was started on oral GDMT and able to wean off Nipride  gtt followed by DBA. On 8/20, IABP was removed. GDMT further titrated. Able to tolerate low dose 4 pillar GDMT. DAPT w/ ASA + Brilinta  for DES. Atorvastatin  80 mg started for HLD (LDL 124)  Given brief runs of NSVT, LifeVest was arranged at discharge   She is stable today. Tolerating meds. Co-ox 64% LifeVest being placed. Wil refer for outpatient CR   Diagnostic Studies/Procedures   Cardiac Catheterization 08/19/23: Hemodynamic data: LV: 119/3, EDP 21 mmHg.  Ao 114/79, mean 95 mmHg.   There is no pressure gradient across aortic valve.   Angiographic data: LM: Large-caliber vessel, smooth and normal. LAD: It is occluded in the proximal segment.  Gives origin to small diagonals. LCx: Gives origin to large OM1 and OM 2 with secondary branch, small OM 3.  Smooth and normal. RCA: Dominant vessel, large PDA and moderate-sized PL branches.  Smooth and normal. LV: Entire anterior, anterolateral and apical akinesis, LVEF around 25%.   Intervention data: Successful intra-aortic balloon pump supported PTCA and stenting of the mid LAD, stenosis reduced from 100% to 0% with implantation of a 3.5 x 12 mm Synergy XD DES at 16 atmospheric pressure for 30 seconds x 2.  TIMI 0 to TIMI-3 flow.  Distal slow flow evident due to elevated EDP.  IABP secured in place.        RHC 08/19/23 HEMODYNAMICS: RA:       2 mmHg (mean) RV:       33/1, 2 mmHg PA:       33/14 mmHg (20 mean) PCWP: 12 mmHg (mean)                                      Estimated Fick CO/CI   3.03L/min, 1.9L/min/m2         Thermodilution CO/CI   2.78L/min, 1.75L/min/m2  TPG  8  mmHg                                                 PVR  2.8 Arredondo Units  PAPi  9.5       IMPRESSION: Late presenting anterior MI complicated by MI shock s/p IABP placement Low filling pressures Normal PA pressure, elevated PVR a function of reduced cardiac output Severely reduced cardiac output by TD Severely elevated SVR of 2964 dynes/sec*cm-5  2D Echo 08/20/23 1. Left ventricular ejection fraction, by estimation, is 25 to 30%. The  left ventricle has severely decreased function. The left ventricle  demonstrates regional wall motion abnormalities with akinesis of the mid  to apical anterior, anteroseptal, and  inferoseptal walls, akinesis of the apical inferior and apical lateral  walls, akinesis of the true apex. No LV thrombus noted. Consistent with  large LAD territory infarction. There is mild  concentric left ventricular  hypertrophy. Left ventricular  diastolic parameters are indeterminate.   2. Right ventricular systolic function is normal. The right ventricular  size is normal. There is normal pulmonary artery systolic pressure. The  estimated right ventricular systolic pressure is 30.5 mmHg.   3. A small pericardial effusion is present.   4. The mitral valve is normal in structure. No evidence of mitral valve  regurgitation. No evidence of mitral stenosis.   5. The aortic valve is tricuspid. There is mild calcification of the  aortic valve. Aortic valve regurgitation is not visualized. No aortic  stenosis is present.   6. IABP in descending thoracic aorta.   7. The inferior vena cava is normal in size with <50% respiratory  variability, suggesting right atrial pressure of 8 mmHg.    Discharge Vitals: Blood pressure 100/71, pulse (!) 102, temperature 98.3 F (36.8 C), temperature source Oral, resp. rate (!) 28, height 5' 2 (1.575 m), weight 59.6 kg, SpO2 97%. General:  Well appearing. No resp difficulty HEENT: normal Neck: supple. no JVD. Carotids 2+ bilat; no bruits. No lymphadenopathy or thryomegaly appreciated. Cor: PMI nondisplaced. Regular rate & rhythm. No rubs, gallops or murmurs. Lungs: clear Abdomen: soft, nontender, nondistended. No hepatosplenomegaly. No bruits or masses. Good bowel sounds. Extremities: no cyanosis, clubbing, rash, edema Neuro: alert & orientedx3, cranial nerves grossly intact. moves all 4 extremities w/o difficulty. Affect pleasant    Labs: Lab Results  Component Value Date   WBC 10.7 (H) 08/25/2023   HGB 9.9 (L) 08/25/2023   HCT 30.6 (L) 08/25/2023   MCV 91.6 08/25/2023   PLT 322 08/25/2023    Recent Labs  Lab 08/24/23 0510 08/25/23 0440  NA 134* 133*  K 3.6 3.7  CL 105 108  CO2 24 21*  BUN 10 9  CREATININE 0.98 0.91  CALCIUM  7.9* 7.7*  PROT 5.2*  --   BILITOT 0.8  --   ALKPHOS 47  --   ALT 22  --   AST 47*  --    GLUCOSE 108* 77   Lab Results  Component Value Date   CHOL 233 (H) 08/19/2023   HDL 100 08/19/2023   LDLCALC 124 (H) 08/19/2023   TRIG 44 08/19/2023   BNP (last 3 results) No results for input(s): BNP in the last 8760 hours.  ProBNP (last 3 results) No results for input(s): PROBNP in the last 8760 hours.    Discharge  Medications   Allergies as of 08/25/2023   No Known Allergies      Medication List     STOP taking these medications    hydrochlorothiazide  25 MG tablet Commonly known as: HYDRODIURIL    promethazine 25 MG suppository Commonly known as: PHENERGAN       TAKE these medications    aspirin  81 MG chewable tablet Chew 1 tablet (81 mg total) by mouth daily.   atorvastatin  80 MG tablet Commonly known as: LIPITOR  Take 1 tablet (80 mg total) by mouth daily.   digoxin  0.125 MG tablet Commonly known as: LANOXIN  Take 1 tablet (0.125 mg total) by mouth daily.   empagliflozin  10 MG Tabs tablet Commonly known as: JARDIANCE  Take 1 tablet (10 mg total) by mouth daily.   ivabradine  5 MG Tabs tablet Commonly known as: CORLANOR  Take 1 tablet (5 mg total) by mouth 2 (two) times daily with a meal.   metoprolol  succinate 25 MG 24 hr tablet Commonly known as: TOPROL -XL Take 1 tablet (25 mg total) by mouth daily.   nitroGLYCERIN  0.4 MG SL tablet Commonly known as: Nitrostat  Place 1 tablet (0.4 mg total) under the tongue every 5 (five) minutes as needed for chest pain.   sacubitril -valsartan  24-26 MG Commonly known as: ENTRESTO  Take 1 tablet by mouth 2 (two) times daily.   spironolactone  25 MG tablet Commonly known as: ALDACTONE  Take 1 tablet (25 mg total) by mouth daily.   ticagrelor  90 MG Tabs tablet Commonly known as: BRILINTA  Take 1 tablet (90 mg total) by mouth 2 (two) times daily.               Durable Medical Equipment  (From admission, onward)           Start     Ordered   08/25/23 0945  For home use only DME 4 wheeled  rolling walker with seat  Once       Question:  Patient needs a walker to treat with the following condition  Answer:  Weakness   08/25/23 0945   08/24/23 1806  For home use only DME 3 n 1  Once        08/24/23 1805   08/23/23 1246  For home use only DME Vest life vest  Once       Question Answer Comment  Indication: MI with an EF less than or equal to 35%   Life Vest Setting: VT Heart Rate Threshold - Defaut 150 BPM   Length of need: 3 months   Start date: 08/24/23      08/23/23 1246            Disposition   The patient will be discharged in stable condition to home. Discharge Instructions     Amb Referral to Cardiac Rehabilitation   Complete by: As directed    Diagnosis:  Coronary Stents STEMI     After initial evaluation and assessments completed: Virtual Based Care may be provided alone or in conjunction with Phase 2 Cardiac Rehab based on patient barriers.: Yes   Intensive Cardiac Rehabilitation (ICR) [facility] location only OR Traditional Cardiac Rehabilitation (TCR) *If criteria for ICR are not met will enroll in TCR [facility] only): Yes       Follow-up Information     Zenaida Morene PARAS, MD Follow up.   Specialty: Cardiology Contact information: [address], [address] [place] [address] [phone]         [facility] Follow  up.   Specialty: Rehabilitation Why: Physical therapy. Office will call to arrange follow up after hospital discharge. Contact information: [address] [place]  [phone]                  Duration of Discharge Encounter: Greater than 40 minutes   Bonney Toribio Cherrie ROLLA 08/25/2023, 11:57 AM

## 2023-08-23 NOTE — TOC Progression Note (Addendum)
 Transition of Care [facility]) - Progression Note    Patient Details  Name: Karen Cameron MRN: [id number] Date of Birth: 1965/07/23  Transition of Care [facility]) CM/SW Contact  Justina Delcia Czar, RN Phone Number: [phone] 08/23/2023, 12:44 PM  Clinical Narrative:    Contacted [employer] rep, Alm for [employer] for home. Faxed referral to [employer].  Spoke to [employer] rep, Alm, pt was approved for [facility], will fit this evening close to dc. Updated attending.  Expected Discharge Plan: Home/Self Care Barriers to Discharge: Continued Medical Work up    Expected Discharge Plan and Services   Discharge Planning Services: CM Consult       Social Drivers of Health (SDOH) Interventions SDOH Screenings   Food Insecurity: No Food Insecurity (08/20/2023)  Housing: Low Risk  (08/20/2023)  Transportation Needs: No Transportation Needs (08/20/2023)  Utilities: Not At Risk (08/20/2023)  Depression (PHQ2-9): Low Risk  (05/21/2023)  Tobacco Use: Low Risk  (08/19/2023)    Readmission Risk Interventions     No data to display

## 2023-08-23 NOTE — Plan of Care (Signed)
  Problem: Education: Goal: Knowledge of General Education information will improve Description: Including pain rating scale, medication(s)/side effects and non-pharmacologic comfort measures Outcome: Progressing   Problem: Health Behavior/Discharge Planning: Goal: Ability to manage health-related needs will improve Outcome: Progressing   Problem: Clinical Measurements: Goal: Ability to maintain clinical measurements within normal limits will improve Outcome: Progressing Goal: Will remain free from infection Outcome: Progressing Goal: Diagnostic test results will improve Outcome: Progressing Goal: Respiratory complications will improve Outcome: Progressing Goal: Cardiovascular complication will be avoided Outcome: Progressing   Problem: Activity: Goal: Risk for activity intolerance will decrease Outcome: Progressing   Problem: Nutrition: Goal: Adequate nutrition will be maintained Outcome: Progressing   Problem: Coping: Goal: Level of anxiety will decrease Outcome: Progressing   Problem: Elimination: Goal: Will not experience complications related to bowel motility Outcome: Progressing Goal: Will not experience complications related to urinary retention Outcome: Progressing   Problem: Pain Managment: Goal: General experience of comfort will improve and/or be controlled Outcome: Progressing   Problem: Safety: Goal: Ability to remain free from injury will improve Outcome: Progressing   Problem: Skin Integrity: Goal: Risk for impaired skin integrity will decrease Outcome: Progressing   Problem: Education: Goal: Understanding of CV disease, CV risk reduction, and recovery process will improve Outcome: Progressing Goal: Individualized Educational Video(s) Outcome: Progressing   Problem: Activity: Goal: Ability to return to baseline activity level will improve Outcome: Progressing   Problem: Cardiovascular: Goal: Ability to achieve and maintain adequate  cardiovascular perfusion will improve Outcome: Progressing Goal: Vascular access site(s) Level 0-1 will be maintained Outcome: Progressing   Problem: Health Behavior/Discharge Planning: Goal: Ability to safely manage health-related needs after discharge will improve Outcome: Progressing   Problem: Cardiac: Goal: Ability to achieve and maintain adequate cardiopulmonary perfusion will improve Outcome: Progressing Goal: Vascular access site(s) Level 0-1 will be maintained Outcome: Progressing   Problem: Fluid Volume: Goal: Ability to achieve a balanced intake and output will improve Outcome: Progressing   Problem: Physical Regulation: Goal: Complications related to the disease process, condition or treatment will be avoided or minimized Outcome: Progressing   Problem: Respiratory: Goal: Will regain and/or maintain adequate ventilation Outcome: Progressing

## 2023-08-23 NOTE — Progress Notes (Addendum)
 Advanced Heart Failure Rounding Note  Cardiologist: None  Chief Complaint: Chest pain  Patient Profile   Karen Cameron is a 58 y.o. female with a history of HTN who presented with late presentation of anterior STEMI, subsequently developing cardiogenic shock requiring IABP placement.   Cardiac Studies   LHC: 1V CAD, 100% pLAD, s/p PCI + DES, LVEDP 22, EF 25%  RHC:  HEMODYNAMICS: RA:       2 mmHg (mean) RV:       33/1, 2 mmHg PA:       33/14 mmHg (20 mean) PCWP: 12 mmHg (mean)                                      Estimated Fick CO/CI   3.03L/min, 1.9L/min/m2         Thermodilution CO/CI   2.78L/min, 1.75L/min/m2                                      TPG  8  mmHg                                                 PVR  2.8 Negron Units  PAPi  9.5       IMPRESSION: Late presenting anterior MI complicated by MI shock s/p IABP placement Low filling pressures Normal PA pressure, elevated PVR a function of reduced cardiac output Severely reduced cardiac output by TD Severely elevated SVR of 2964 dynes/sec*cm-5  2D Echo: EF 25-30%, RV ok. Apex AK. No LV thrombus    Subjective:    8/19: DBA stopped  8/20: IABP removed. Swan removed, PICC placed    Co-ox 57% today, FICK CI 2.15. CVP 6-7   AKI resolved. SCr 1.3>>0.86   Remains tachycardic, 110s. Brief runs of NSVT overnight.  SBPs 90s-110s  Sitting up in bed. Appetite still not great but has been trying to eat. Complains of nausea. Denies CP. No dyspnea.  Rt groin (IABP access site) stable.     Objective:   Weight Range: 58.8 kg Body mass index is 23.71 kg/m.   Vital Signs:   Temp:  [98.6 F (37 C)-100.2 F (37.9 C)] 98.6 F (37 C) (08/21 0330) Pulse Rate:  [101-237] 109 (08/21 0400) Resp:  [20-46] 46 (08/21 0400) BP: (94-136)/(65-102) 112/81 (08/21 0400) SpO2:  [91 %-100 %] 91 % (08/21 0400) Last BM Date : 08/19/23 (PTA)  Weight change: Filed Weights   08/19/23 0924 08/19/23 1300 08/21/23 0500  Weight:  59 kg 58.7 kg 58.8 kg    Intake/Output:   Intake/Output Summary (Last 24 hours) at 08/23/2023 0717 Last data filed at 08/23/2023 0400 Gross per 24 hour  Intake 689.29 ml  Output 725 ml  Net -35.71 ml      Physical Exam   CVP 6-7 GENERAL: fatigued/ depressed appearing,  NAD Lungs- clear CARDIAC:  JVP: 6 cm          Normal rate with regular rhythm. No MRG. No LEE  ABDOMEN: Soft, non-tender, non-distended.  EXTREMITIES: Warm and well perfused.  NEUROLOGIC: No obvious FND GU: + foley    Telemetry   Sinus tach 110s, brief runs of NSVT 4-5  beats, personally review   Medications:     Scheduled Medications:  aspirin   81 mg Oral Daily   atorvastatin   80 mg Oral Daily   Chlorhexidine  Gluconate Cloth  6 each Topical Daily   digoxin   0.125 mg Oral Daily   feeding supplement  237 mL Oral BID BM   ivabradine   5 mg Oral BID WC   sacubitril -valsartan   1 tablet Oral BID   sodium chloride  flush  10-40 mL Intracatheter Q12H   sodium chloride  flush  10-40 mL Intracatheter Q12H   spironolactone   12.5 mg Oral Daily   ticagrelor   90 mg Oral BID    Infusions:    PRN Medications: sodium chloride , acetaminophen , guaiFENesin -dextromethorphan , ondansetron  (ZOFRAN ) IV, mouth rinse, prochlorperazine , sodium chloride  flush, sodium chloride  flush     Assessment/Plan   1. Acute Systolic Heart Failure/Cardiogenic shock: Due to late presentation of anterior STEMI, severely reduced EF on LV gram. Echo EF 25-30%, RV ok Apex AK, no LV thrombus  - DBA weaned off 8/19 - IABP removed 8/20  - Co-ox 57% off support, FICK CI 2.15  - CVP 6-7 - continue Entresto  24-26 mg bid - continue spiro 12.5 mg daily  - continue digoxin  0.125 mg  - continue corlanor  5 mg bid  - start toprol  xl 25 mg daily  - no diuretics or SGLT2i currently w/ low CVPs  - repeat bedside echo today  - start phase 1 Cardiac rehab   2. Anterior STEMI/CAD: Prior history of HTN, minimal other risk factors. Cath w/ 1V  occlusive disease, 100%, pLAD. S/p successful PCI, complicated by shock as above. - denies CP  - Continue aspirin  + ticagrelor  - Atorvastatin  80 mg daily  - brief runs of NSVT on tele. Will arrange for LifeVest at d/c   3. HLD: LDL 124 - Statin as above - LDL Goal < 55   4. Shock Liver - improving - AST 751>>472 - ALT 55>>46  - f/u levels in AM   5. Hypertension  - now controlled  - continue HF GDMT titration per above    6. AKI  - resolved  - baseline SCr ~1.0  - SCr 1.3>>0.9  - follow BMP   Pull foley. Consult CR. Ambulate today     CRITICAL CARE Performed by: Caffie Shed   Total critical care time: 15 minutes  Critical care time was exclusive of separately billable procedures and treating other patients.  Critical care was necessary to treat or prevent imminent or life-threatening deterioration.  Critical care was time spent personally by me on the following activities: development of treatment plan with patient and/or surrogate as well as nursing, discussions with consultants, evaluation of patient's response to treatment, examination of patient, obtaining history from patient or surrogate, ordering and performing treatments and interventions, ordering and review of laboratory studies, ordering and review of radiographic studies, pulse oximetry and re-evaluation of patient's condition.   Length of Stay: [address] PA-C  08/23/2023, 7:17 AM  Advanced Heart Failure Team Pager [phone] (M-F; 7a - 5p)  Please contact [facility] for night-coverage after hours (5p -7a ) and weekends on [email or web address]  Agree with above.  IABP out yesterday. Off DBA. Co-ox 57%  CVP 6-7. Remains tachy.   General:  Sitting up in bed  No resp difficulty HEENT: normal Neck: supple. JVP 7 . Carotids 2+ bilat; no bruits. No lymphadenopathy or thryomegaly appreciated. Cor: PMI nondisplaced. Regular tachy Lungs: clear Abdomen: soft, nontender, nondistended. No  hepatosplenomegaly. No bruits  or masses. Good bowel sounds. Extremities: no cyanosis, clubbing, rash, edema Neuro: alert & orientedx3, cranial nerves grossly intact. moves all 4 extremities w/o difficulty. Affect pleasant  IABP now out. Hemodynamics marginal but stable. Continue to titrate GDMT. Careful with volume status.   Ambulate.   CRITICAL CARE Performed by: Cherrie Sieving  Total critical care time: 39 minutes  Critical care time was exclusive of separately billable procedures and treating other patients.  Critical care was necessary to treat or prevent imminent or life-threatening deterioration.  Critical care was time spent personally by me (independent of midlevel providers or residents) on the following activities: development of treatment plan with patient and/or surrogate as well as nursing, discussions with consultants, evaluation of patient's response to treatment, examination of patient, obtaining history from patient or surrogate, ordering and performing treatments and interventions, ordering and review of laboratory studies, ordering and review of radiographic studies, pulse oximetry and re-evaluation of patient's condition.  Sieving Cherrie, MD  10:48 AM

## 2023-08-24 ENCOUNTER — Other Ambulatory Visit (HOSPITAL_COMMUNITY): Payer: Self-pay

## 2023-08-24 ENCOUNTER — Telehealth (HOSPITAL_COMMUNITY): Payer: Self-pay | Admitting: Pharmacy Technician

## 2023-08-24 DIAGNOSIS — I5021 Acute systolic (congestive) heart failure: Secondary | ICD-10-CM

## 2023-08-24 LAB — BASIC METABOLIC PANEL WITH GFR
Anion gap: 5 (ref 5–15)
BUN: 10 mg/dL (ref 6–20)
CO2: 24 mmol/L (ref 22–32)
Calcium: 7.9 mg/dL — ABNORMAL LOW (ref 8.9–10.3)
Chloride: 105 mmol/L (ref 98–111)
Creatinine, Ser: 0.98 mg/dL (ref 0.44–1.00)
GFR, Estimated: >60 mL/min (ref >60)
Glucose, Bld: 108 mg/dL — ABNORMAL HIGH (ref 70–99)
Potassium: 3.6 mmol/L (ref 3.5–5.1)
Sodium: 134 mmol/L — ABNORMAL LOW (ref 135–145)

## 2023-08-24 LAB — HEPATIC FUNCTION PANEL
ALT: 22 U/L (ref 0–44)
AST: 47 U/L — ABNORMAL HIGH (ref 15–41)
Albumin: 2.5 g/dL — ABNORMAL LOW (ref 3.5–5.0)
Alkaline Phosphatase: 47 U/L (ref 38–126)
Bilirubin, Direct: 0.1 mg/dL (ref 0.0–0.2)
Indirect Bilirubin: 0.7 mg/dL (ref 0.3–0.9)
Total Bilirubin: 0.8 mg/dL (ref 0.0–1.2)
Total Protein: 5.2 g/dL — ABNORMAL LOW (ref 6.5–8.1)

## 2023-08-24 LAB — COOXEMETRY PANEL
Carboxyhemoglobin: 2.2 % — ABNORMAL HIGH (ref 0.5–1.5)
Methemoglobin: <0.7 % (ref 0.0–1.5)
O2 Saturation: 65.1 %
Total hemoglobin: 9.8 g/dL — ABNORMAL LOW (ref 12.0–16.0)

## 2023-08-24 LAB — CBC
HCT: 28.9 % — ABNORMAL LOW (ref 36.0–46.0)
Hemoglobin: 9.5 g/dL — ABNORMAL LOW (ref 12.0–15.0)
MCH: 29.7 pg (ref 26.0–34.0)
MCHC: 32.9 g/dL (ref 30.0–36.0)
MCV: 90.3 fL (ref 80.0–100.0)
Platelets: 270 K/uL (ref 150–400)
RBC: 3.2 MIL/uL — ABNORMAL LOW (ref 3.87–5.11)
RDW: 12.8 % (ref 11.5–15.5)
WBC: 9.3 K/uL (ref 4.0–10.5)
nRBC: 0 % (ref 0.0–0.2)

## 2023-08-24 LAB — MAGNESIUM: Magnesium: 1.7 mg/dL (ref 1.7–2.4)

## 2023-08-24 MED ORDER — NITROGLYCERIN 0.4 MG SL SUBL
0.4000 mg | SUBLINGUAL_TABLET | SUBLINGUAL | 3 refills | Status: AC | PRN
  Filled 2023-08-24: qty 100, 1d supply, fill #0
  Filled 2023-08-24 – 2023-12-18 (×3): qty 100, 32d supply, fill #0

## 2023-08-24 MED ORDER — METOPROLOL SUCCINATE ER 25 MG PO TB24
25.0000 mg | ORAL_TABLET | Freq: Every day | ORAL | 5 refills | Status: DC
  Filled 2023-08-24 (×2): qty 30, 30d supply, fill #0
  Filled 2023-09-24: qty 30, 30d supply, fill #1
  Filled 2023-10-10 – 2023-10-24 (×2): qty 30, 30d supply, fill #0
  Filled 2023-11-21: qty 30, 30d supply, fill #1
  Filled 2023-12-18: qty 30, 30d supply, fill #2

## 2023-08-24 MED ORDER — SACUBITRIL-VALSARTAN 24-26 MG PO TABS
1.0000 | ORAL_TABLET | Freq: Two times a day (BID) | ORAL | 5 refills | Status: AC
  Filled 2023-08-24 (×2): qty 60, 30d supply, fill #0
  Filled 2023-09-24: qty 60, 30d supply, fill #1
  Filled 2023-10-10 – 2023-10-24 (×2): qty 60, 30d supply, fill #0
  Filled 2023-11-21: qty 60, 30d supply, fill #1
  Filled 2023-12-18: qty 60, 30d supply, fill #2
  Filled 2024-01-21: qty 60, 30d supply, fill #3

## 2023-08-24 MED ORDER — TICAGRELOR 90 MG PO TABS
90.0000 mg | ORAL_TABLET | Freq: Two times a day (BID) | ORAL | 5 refills | Status: AC
  Filled 2023-08-24 (×2): qty 60, 30d supply, fill #0
  Filled 2023-09-24: qty 60, 30d supply, fill #1
  Filled 2023-10-10 – 2023-10-24 (×2): qty 60, 30d supply, fill #0
  Filled 2023-11-21: qty 60, 30d supply, fill #1
  Filled 2023-12-18: qty 60, 30d supply, fill #2
  Filled 2024-01-21: qty 60, 30d supply, fill #3

## 2023-08-24 MED ORDER — IVABRADINE HCL 5 MG PO TABS
5.0000 mg | ORAL_TABLET | Freq: Two times a day (BID) | ORAL | 5 refills | Status: AC
  Filled 2023-08-24 (×2): qty 60, 30d supply, fill #0
  Filled 2023-09-24: qty 60, 30d supply, fill #1
  Filled 2023-10-10 – 2023-10-24 (×2): qty 60, 30d supply, fill #0
  Filled 2023-11-21: qty 60, 30d supply, fill #1
  Filled 2023-12-18: qty 60, 30d supply, fill #2
  Filled 2024-01-21: qty 60, 30d supply, fill #3

## 2023-08-24 MED ORDER — EMPAGLIFLOZIN 10 MG PO TABS
10.0000 mg | ORAL_TABLET | Freq: Every day | ORAL | 5 refills | Status: AC
  Filled 2023-08-24 (×2): qty 30, 30d supply, fill #0
  Filled 2023-09-24 (×2): qty 30, 30d supply, fill #1
  Filled 2023-10-10 – 2023-10-24 (×2): qty 30, 30d supply, fill #0
  Filled 2023-11-21: qty 30, 30d supply, fill #1
  Filled 2023-12-18: qty 30, 30d supply, fill #2
  Filled 2024-01-21: qty 30, 30d supply, fill #3

## 2023-08-24 MED ORDER — MAGNESIUM SULFATE 4 GM/100ML IV SOLN
4.0000 g | Freq: Once | INTRAVENOUS | Status: AC
  Administered 2023-08-24: 4 g via INTRAVENOUS
  Filled 2023-08-24: qty 100

## 2023-08-24 MED ORDER — DIGOXIN 125 MCG PO TABS
0.1250 mg | ORAL_TABLET | Freq: Every day | ORAL | 5 refills | Status: DC
  Filled 2023-08-24 (×2): qty 30, 30d supply, fill #0

## 2023-08-24 MED ORDER — EMPAGLIFLOZIN 10 MG PO TABS
10.0000 mg | ORAL_TABLET | Freq: Every day | ORAL | Status: DC
  Administered 2023-08-24 – 2023-08-25 (×2): 10 mg via ORAL
  Filled 2023-08-24 (×2): qty 1

## 2023-08-24 MED ORDER — POTASSIUM CHLORIDE 20 MEQ PO PACK
40.0000 meq | PACK | Freq: Once | ORAL | Status: AC
  Administered 2023-08-24: 40 meq via ORAL
  Filled 2023-08-24: qty 2

## 2023-08-24 MED ORDER — ATORVASTATIN CALCIUM 80 MG PO TABS
80.0000 mg | ORAL_TABLET | Freq: Every day | ORAL | 5 refills | Status: AC
  Filled 2023-08-24 (×2): qty 30, 30d supply, fill #0
  Filled 2023-09-24: qty 30, 30d supply, fill #1
  Filled 2023-10-10 – 2023-10-24 (×2): qty 30, 30d supply, fill #0
  Filled 2023-11-21: qty 30, 30d supply, fill #1
  Filled 2023-12-18: qty 30, 30d supply, fill #2
  Filled 2024-01-21: qty 30, 30d supply, fill #3

## 2023-08-24 MED ORDER — SPIRONOLACTONE 25 MG PO TABS
25.0000 mg | ORAL_TABLET | Freq: Every day | ORAL | Status: DC
  Administered 2023-08-24 – 2023-08-25 (×2): 25 mg via ORAL
  Filled 2023-08-24 (×2): qty 1

## 2023-08-24 MED ORDER — ASPIRIN 81 MG PO CHEW
81.0000 mg | CHEWABLE_TABLET | Freq: Every day | ORAL | 5 refills | Status: AC
  Filled 2023-08-24 (×2): qty 30, 30d supply, fill #0
  Filled 2023-09-24: qty 30, 30d supply, fill #1
  Filled 2023-10-10 – 2023-10-24 (×2): qty 30, 30d supply, fill #0
  Filled 2023-11-21: qty 30, 30d supply, fill #1
  Filled 2023-12-18: qty 30, 30d supply, fill #2
  Filled 2024-01-21: qty 30, 30d supply, fill #3

## 2023-08-24 MED ORDER — SPIRONOLACTONE 25 MG PO TABS
25.0000 mg | ORAL_TABLET | Freq: Every day | ORAL | 5 refills | Status: AC
  Filled 2023-08-24 (×2): qty 30, 30d supply, fill #0
  Filled 2023-09-24: qty 30, 30d supply, fill #1
  Filled 2023-10-10 – 2023-10-24 (×2): qty 30, 30d supply, fill #0
  Filled 2023-11-21: qty 30, 30d supply, fill #1
  Filled 2023-12-18: qty 30, 30d supply, fill #2
  Filled 2024-01-21: qty 30, 30d supply, fill #3

## 2023-08-24 NOTE — Evaluation (Signed)
 Physical Therapy Evaluation Patient Details Name: Karen Cameron MRN: [id number] DOB: [birth year] Today's Date: 08/24/2023  History of Present Illness  58 y.o. female presents to [facility] hospital on 08/19/2023 with chest pain and vomiting.EKG with findings of anterolateral STEMI. Cardiac cath on 8/17 with acutely occluded LAD, underwent thrombectomy and IABP placement. IABP removed 8/20. PMH includes HTN.  Clinical Impression  Pt presents to PT with deficits in strength, power, activity tolerance. Pt is able to ambulate for household distances, progressing to ambulation without UE support. Pt is encouraged to mobilize frequently in an effort to improve activity tolerance and gait quality. PT recommends outpatient PT and a BSC at the time of discharge.        If plan is discharge home, recommend the following: Assistance with cooking/housework;Assist for transportation   Can travel by private vehicle        Equipment Recommendations BSC/3in1  Recommendations for Other Services       Functional Status Assessment Patient has had a recent decline in their functional status and demonstrates the ability to make significant improvements in function in a reasonable and predictable amount of time.     Precautions / Restrictions Precautions Precautions: Fall Recall of Precautions/Restrictions: Intact Restrictions Weight Bearing Restrictions Per Provider Order: No      Mobility  Bed Mobility Overal bed mobility: Independent                  Transfers Overall transfer level: Independent Equipment used: None                    Ambulation/Gait Ambulation/Gait assistance: Supervision Gait Distance (Feet): 300 Feet Assistive device: None, 1 person hand held assist Gait Pattern/deviations: Step-through pattern Gait velocity: reduced Gait velocity interpretation: 1.31 - 2.62 ft/sec, indicative of limited community ambulator   General Gait Details: hand hold for 1st 150', no  support for final 150'. slowed step-through gait, mild increase in lateral drift initially but this appears to improve with increased distance  Stairs            Wheelchair Mobility     Tilt Bed    Modified Rankin (Stroke Patients Only)       Balance Overall balance assessment: Needs assistance Sitting-balance support: No upper extremity supported, Feet supported Sitting balance-Leahy Scale: Good     Standing balance support: No upper extremity supported, During functional activity Standing balance-Leahy Scale: Good                               Pertinent Vitals/Pain Pain Assessment Pain Assessment: No/denies pain    Home Living Family/patient expects to be discharged to:: Private residence Living Arrangements: Spouse/significant other Available Help at Discharge: Family;Available PRN/intermittently Type of Home: House Home Access: Stairs to enter Entrance Stairs-Rails: Left Entrance Stairs-Number of Steps: 2   Home Layout: Multi-level;Able to live on main level with bedroom/bathroom Home Equipment: Shower seat - built in      Prior Function Prior Level of Function : Independent/Modified Independent;Working/employed;Driving                     Extremity/Trunk Assessment   Upper Extremity Assessment Upper Extremity Assessment: Overall WFL for tasks assessed    Lower Extremity Assessment Lower Extremity Assessment: Generalized weakness    Cervical / Trunk Assessment Cervical / Trunk Assessment: Normal  Communication   Communication Communication: No apparent difficulties    Cognition Arousal: Alert Behavior  During Therapy: WFL for tasks assessed/performed   PT - Cognitive impairments: No apparent impairments                         Following commands: Intact       Cueing Cueing Techniques: Verbal cues     General Comments General comments (skin integrity, edema, etc.): VSS on RA    Exercises      Assessment/Plan    PT Assessment Patient needs continued PT services  PT Problem List Decreased activity tolerance;Decreased balance;Decreased mobility;Cardiopulmonary status limiting activity       PT Treatment Interventions Gait training;Stair training;Functional mobility training;Therapeutic exercise;Therapeutic activities;Patient/family education    PT Goals (Current goals can be found in the Care Plan section)  Acute Rehab PT Goals Patient Stated Goal: to return to independence PT Goal Formulation: With patient Time For Goal Achievement: 09/07/23 Potential to Achieve Goals: Good Additional Goals Additional Goal #1: Pt will score >19/24 on the DGI to indicate a reduced risk for falls Additional Goal #2: Pt will ambulate for >1000' without reports of DOE, demonstrating improved activity tolerance    Frequency Min 2X/week     Co-evaluation               AM-PAC PT 6 Clicks Mobility  Outcome Measure Help needed turning from your back to your side while in a flat bed without using bedrails?: None Help needed moving from lying on your back to sitting on the side of a flat bed without using bedrails?: None Help needed moving to and from a bed to a chair (including a wheelchair)?: None Help needed standing up from a chair using your arms (e.g., wheelchair or bedside chair)?: None Help needed to walk in hospital room?: A Little Help needed climbing 3-5 steps with a railing? : A Little 6 Click Score: 22    End of Session   Activity Tolerance: Patient tolerated treatment well Patient left: in bed;with call bell/phone within reach Nurse Communication: Mobility status PT Visit Diagnosis: Other abnormalities of gait and mobility (R26.89)    Time: 8390-8374 PT Time Calculation (min) (ACUTE ONLY): 16 min   Charges:   PT Evaluation $PT Eval Low Complexity: 1 Low   PT General Charges $$ ACUTE PT VISIT: 1 Visit         Bernardino JINNY Ruth, PT, DPT Acute  Rehabilitation Office [phone]   Bernardino JINNY Ruth 08/24/2023, 4:40 PM

## 2023-08-24 NOTE — Plan of Care (Signed)

## 2023-08-24 NOTE — TOC Progression Note (Signed)
 Transition of Care [facility]) - Progression Note    Patient Details  Name: Karen Cameron MRN: [id number] Date of Birth: [birth year]  [facility] CM/SW Contact  Justina Delcia Czar, RN Phone Number: [phone] 08/24/2023, 6:06 PM  Clinical Narrative:     Contacted [employer] rep, Alm and pt will be fitted this evening.  Order in for 3n1 bedside commode, Will have weekend ICM order with provider on tomorrow.   Expected Discharge Plan: Home/Self Care Barriers to Discharge: Continued Medical Work up   Expected Discharge Plan and Services   Discharge Planning Services: CM Consult            Social Drivers of Health (SDOH) Interventions SDOH Screenings   Food Insecurity: No Food Insecurity (08/20/2023)  Housing: Low Risk  (08/20/2023)  Transportation Needs: No Transportation Needs (08/20/2023)  Utilities: Not At Risk (08/20/2023)  Depression (PHQ2-9): Low Risk  (05/21/2023)  Tobacco Use: Low Risk  (08/19/2023)    Readmission Risk Interventions     No data to display

## 2023-08-24 NOTE — Telephone Encounter (Addendum)
 Patient Product/process development scientist completed.    The patient is insured through [facility]. Patient has [employer], may use a copay card, and/or apply for patient assistance if available.    Ran test claim for Jardiance  10 mg and the current 30 day co-pay is $106.20.  Ran test claim for Farxiga 10 mg and the current 30 day co-pay is $111.20.  This test claim was processed through [facility]- copay amounts may vary at other pharmacies due to pharmacy/plan contracts, or as the patient moves through the different stages of their insurance plan.     Karen Cameron, CPHT Pharmacy Technician III Certified Patient Advocate [facility] Patient Advocate Team Direct Number: [phone]  Fax: [phone]

## 2023-08-24 NOTE — Progress Notes (Signed)
 Advanced Heart Failure Rounding Note  Cardiologist: None  HF Cardiologist: Dr. Zenaida Chief Complaint: Chest pain Patient Profile   Karen Cameron is a 58 y.o. female with a history of HTN who presented with late presentation of anterior STEMI, subsequently developing cardiogenic shock requiring IABP placement.   Cardiac Studies   LHC: 1V CAD, 100% pLAD, s/p PCI + DES, LVEDP 22, EF 25%  RHC: RA 2, PA 33/14 (20), PCWP 12, CO/CI (TD) 2.78/1.75, TPG 8, PVR 2.8, PAPi 9.5 LHC: Late presenting anterior MI complicated by MI shock s/p IABP placement Low filling pressures Normal PA pressure, elevated PVR a function of reduced cardiac output Severely reduced cardiac output by TD Severely elevated SVR of 2964 dynes/sec*cm-5  2D Echo: EF 25-30%, RV ok. Apex AK. No LV thrombus    Subjective:    8/19: DBA stopped  8/20: IABP removed. Swan removed, PICC placed    Coox 65%. CVP 6. sCr normalized.  Sitting up in bed, eating breakfast. Feels well this morning, although tired, did not sleep well. No nausea this morning. No SOB. R groin site ok, no pain. Worked with CR yesterday.   Objective:    Weight Range: 60.9 kg Body mass index is 24.56 kg/m.   Vital Signs:   Temp:  [97.6 F (36.4 C)-99 F (37.2 C)] 97.6 F (36.4 C) (08/22 0400) Pulse Rate:  [91-128] 98 (08/22 0600) Resp:  [19-46] 39 (08/22 0600) BP: (91-119)/(61-89) 105/77 (08/22 0600) SpO2:  [64 %-100 %] 96 % (08/22 0600) Weight:  [60.9 kg] 60.9 kg (08/21 0745) Last BM Date : 08/23/23  Weight change: Filed Weights   08/19/23 1300 08/21/23 0500 08/23/23 0745  Weight: 58.7 kg 58.8 kg 60.9 kg   Intake/Output:  Intake/Output Summary (Last 24 hours) at 08/24/2023 0715 Last data filed at 08/24/2023 0500 Gross per 24 hour  Intake 600 ml  Output 105 ml  Net 495 ml    Physical Exam   General: Well appearing. No distress on RA Cardiac: JVP flat. S1 and S2 present. No murmurs or rub. Resp: Lung sounds clear and equal  B/L Extremities: Warm and dry.  No edema.  Neuro: Alert and oriented x3. Affect pleasant.  Telemetry   ST 90-100s, occasional PVCs (personally reviewed)  Medications:    Scheduled Medications:  aspirin   81 mg Oral Daily   atorvastatin   80 mg Oral Daily   Chlorhexidine  Gluconate Cloth  6 each Topical Daily   digoxin   0.125 mg Oral Daily   enoxaparin  (LOVENOX ) injection  40 mg Subcutaneous Q24H   feeding supplement  237 mL Oral BID BM   ivabradine   5 mg Oral BID WC   metoprolol  succinate  25 mg Oral Daily   potassium chloride   40 mEq Oral Once   sacubitril -valsartan   1 tablet Oral BID   sodium chloride  flush  10-40 mL Intracatheter Q12H   sodium chloride  flush  10-40 mL Intracatheter Q12H   spironolactone   12.5 mg Oral Daily   ticagrelor   90 mg Oral BID    Infusions:  magnesium  sulfate bolus IVPB      PRN Medications: sodium chloride , acetaminophen , guaiFENesin -dextromethorphan , ondansetron  (ZOFRAN ) IV, mouth rinse, prochlorperazine , sodium chloride  flush, sodium chloride  flush  Assessment/Plan   1. Acute Systolic Heart Failure  Cardiogenic shock: Shock resolved.Due to late presentation of anterior STEMI, severely reduced EF on LV gram. Echo EF 25-30%, RV ok Apex AK, no LV thrombus  - DBA weaned off 8/19 - IABP removed 8/20  - Co-ox  65%. CVP 6  - no need for diuretics at this time.  - start jardiance  10 mg daily - continue entresto  24-26 mg bid, BP too low to titrate - increase spiro to 25 mg daily  - continue digoxin  0.125 mg daily - continue corlanor  5 mg bid  - continue toprol  xl 25 mg daily  - repeat bedside echo today to ensure no LV thrombus - seen by CRPI, referred to PII. Will put in for PT.   2. Anterior STEMI/CAD: Prior history of HTN, minimal other risk factors. Cath w/ 1V occlusive disease, 100%, pLAD. S/p successful PCI, complicated by shock as above. - denies CP  - Continue aspirin  + ticagrelor  - Atorvastatin  80 mg daily  - occasional PVCs, short  runs of NSVT. Will arrange for LifeVest at d/c   3. HLD: LDL 124 - Statin as above - LDL Goal < 55   4. Shock Liver - improving - f/u levels in AM   5. Hypertension  - now controlled  - continue HF GDMT titration per above    6. AKI  - resolved   Transfer out of the unit today.   Length of Stay: 5  [place] Lee, NP  08/24/2023, 7:15 AM  Advanced Heart Failure Team Pager [phone] (M-F; 7a - 5p)  Please contact [facility] for night-coverage after hours (5p -7a ) and weekends on [email or web address]  Patient seen and examined with the above-signed Advanced Practice Provider and/or Housestaff. I personally reviewed laboratory data, imaging studies and relevant notes. I independently examined the patient and formulated the important aspects of the plan. I have edited the note to reflect any of my changes or salient points. I have personally discussed the plan with the patient and/or family.  Doing well off inotropes. Co-ox and CVP stable HR coming down  General: Sitting up No resp difficulty HEENT: normal Neck: supple. no JVD. Carotids 2+ bilat; no bruits. No lymphadenopathy or thryomegaly appreciated. Cor: PMI nondisplaced. Regular rate & rhythm. No rubs, gallops or murmurs. Lungs: clear Abdomen: soft, nontender, nondistended. No hepatosplenomegaly. No bruits or masses. Good bowel sounds. Extremities: no cyanosis, clubbing, rash, edema Neuro: alert & orientedx3, cranial nerves grossly intact. moves all 4 extremities w/o difficulty. Affect pleasant  Plan repeat echo today. Titrated GDMT as above. Place LifeVest. Can go to [facility].   TOC meds d/w PharmD.   Toribio Fuel, MD  9:23 AM

## 2023-08-24 NOTE — Progress Notes (Signed)
 CARDIAC REHAB PHASE I   PRE:  Rate/Rhythm: 98 SR    BP: sitting 108/77    SpO2: 96 RA  MODE:  Ambulation: 370 ft   POST:  Rate/Rhythm: 114 ST    BP: sitting 99/75, 84/54, 103/74     SpO2: 98 RA   Pt tolerated fairly well. She is generally tired and walked slowly holding to her husband. BP registered low after getting back in bed but rebounded. Denied sx.  Gave pt HF booklet and lightly discussed. She has a scale. No questions regarding education from yesterday. General flat affect.  [phone]  Aliene Aris BS, ACSM-CEP 08/24/2023 12:20 PM

## 2023-08-25 ENCOUNTER — Other Ambulatory Visit (HOSPITAL_COMMUNITY): Payer: Self-pay

## 2023-08-25 LAB — BASIC METABOLIC PANEL WITH GFR
Anion gap: 4 — ABNORMAL LOW (ref 5–15)
BUN: 9 mg/dL (ref 6–20)
CO2: 21 mmol/L — ABNORMAL LOW (ref 22–32)
Calcium: 7.7 mg/dL — ABNORMAL LOW (ref 8.9–10.3)
Chloride: 108 mmol/L (ref 98–111)
Creatinine, Ser: 0.91 mg/dL (ref 0.44–1.00)
GFR, Estimated: >60 mL/min (ref >60)
Glucose, Bld: 77 mg/dL (ref 70–99)
Potassium: 3.7 mmol/L (ref 3.5–5.1)
Sodium: 133 mmol/L — ABNORMAL LOW (ref 135–145)

## 2023-08-25 LAB — COOXEMETRY PANEL
Carboxyhemoglobin: 1.5 % (ref 0.5–1.5)
Methemoglobin: <0.7 % (ref 0.0–1.5)
O2 Saturation: 63.9 %
Total hemoglobin: 10.5 g/dL — ABNORMAL LOW (ref 12.0–16.0)

## 2023-08-25 LAB — CBC
HCT: 30.6 % — ABNORMAL LOW (ref 36.0–46.0)
Hemoglobin: 9.9 g/dL — ABNORMAL LOW (ref 12.0–15.0)
MCH: 29.6 pg (ref 26.0–34.0)
MCHC: 32.4 g/dL (ref 30.0–36.0)
MCV: 91.6 fL (ref 80.0–100.0)
Platelets: 322 K/uL (ref 150–400)
RBC: 3.34 MIL/uL — ABNORMAL LOW (ref 3.87–5.11)
RDW: 12.7 % (ref 11.5–15.5)
WBC: 10.7 K/uL — ABNORMAL HIGH (ref 4.0–10.5)
nRBC: 0 % (ref 0.0–0.2)

## 2023-08-25 LAB — MAGNESIUM: Magnesium: 2.3 mg/dL (ref 1.7–2.4)

## 2023-08-25 LAB — DIGOXIN LEVEL: Digoxin Level: 2.6 ng/mL (ref 0.8–2.0)

## 2023-08-25 MED ORDER — POTASSIUM CHLORIDE CRYS ER 20 MEQ PO TBCR
40.0000 meq | EXTENDED_RELEASE_TABLET | Freq: Once | ORAL | Status: AC
  Administered 2023-08-25: 40 meq via ORAL
  Filled 2023-08-25: qty 2

## 2023-08-25 NOTE — Care Management (Cosign Needed)
    Durable Medical Equipment  (From admission, onward)           Start     Ordered   08/25/23 0945  For home use only DME 4 wheeled rolling walker with seat  Once       Question:  Patient needs a walker to treat with the following condition  Answer:  Weakness   08/25/23 0945   08/24/23 1806  For home use only DME 3 n 1  Once        08/24/23 1805   08/23/23 1246  For home use only DME Vest life vest  Once       Question Answer Comment  Indication: MI with an EF less than or equal to 35%   Life Vest Setting: VT Heart Rate Threshold - Defaut 150 BPM   Length of need: 3 months   Start date: 08/24/23      08/23/23 1246

## 2023-08-25 NOTE — Progress Notes (Signed)
 Advanced Heart Failure Rounding Note  Cardiologist: None  HF Cardiologist: Dr. Zenaida Chief Complaint: Chest pain Patient Profile   Karen Cameron is a 58 y.o. female with a history of HTN who presented with late presentation of anterior STEMI, subsequently developing cardiogenic shock requiring IABP placement.   Cardiac Studies   LHC: 1V CAD, 100% pLAD, s/p PCI + DES, LVEDP 22, EF 25%  RHC: RA 2, PA 33/14 (20), PCWP 12, CO/CI (TD) 2.78/1.75, TPG 8, PVR 2.8, PAPi 9.5 LHC: Late presenting anterior MI complicated by MI shock s/p IABP placement Low filling pressures Normal PA pressure, elevated PVR a function of reduced cardiac output Severely reduced cardiac output by TD Severely elevated SVR of 2964 dynes/sec*cm-5  2D Echo: EF 25-30%, RV ok. Apex AK. No LV thrombus    Subjective:    8/19: DBA stopped  8/20: IABP removed. Swan removed, PICC placed    Feels good. LifeVest being placed  No CP or SOB vitals stable. Co-x 64%   Objective:    Weight Range: 59.6 kg Body mass index is 24.03 kg/m.   Vital Signs:   Temp:  [97.9 F (36.6 C)-99 F (37.2 C)] 98.3 F (36.8 C) (08/23 0734) Pulse Rate:  [84-102] 102 (08/23 0734) Resp:  [15-35] 28 (08/23 0734) BP: (84-105)/(54-77) 100/71 (08/23 0734) SpO2:  [92 %-98 %] 97 % (08/23 0734) Weight:  [59.6 kg] 59.6 kg (08/23 0500) Last BM Date : 08/24/23  Weight change: Filed Weights   08/21/23 0500 08/23/23 0745 08/25/23 0500  Weight: 58.8 kg 60.9 kg 59.6 kg   Intake/Output:  Intake/Output Summary (Last 24 hours) at 08/25/2023 1152 Last data filed at 08/24/2023 1551 Gross per 24 hour  Intake 360 ml  Output --  Net 360 ml    Physical Exam   General:  Well appearing. No resp difficulty HEENT: normal Neck: supple. no JVD. Carotids 2+ bilat; no bruits. No lymphadenopathy or thryomegaly appreciated. Cor: PMI nondisplaced. Regular rate & rhythm. No rubs, gallops or murmurs. Lungs: clear Abdomen: soft, nontender,  nondistended. No hepatosplenomegaly. No bruits or masses. Good bowel sounds. Extremities: no cyanosis, clubbing, rash, edema Neuro: alert & orientedx3, cranial nerves grossly intact. moves all 4 extremities w/o difficulty. Affect pleasant   Telemetry   Sinus 80s Personally reviewed  Medications:    Scheduled Medications:  aspirin   81 mg Oral Daily   atorvastatin   80 mg Oral Daily   Chlorhexidine  Gluconate Cloth  6 each Topical Daily   digoxin   0.125 mg Oral Daily   empagliflozin   10 mg Oral Daily   enoxaparin  (LOVENOX ) injection  40 mg Subcutaneous Q24H   feeding supplement  237 mL Oral BID BM   ivabradine   5 mg Oral BID WC   metoprolol  succinate  25 mg Oral Daily   sacubitril -valsartan   1 tablet Oral BID   sodium chloride  flush  10-40 mL Intracatheter Q12H   sodium chloride  flush  10-40 mL Intracatheter Q12H   spironolactone   25 mg Oral Daily   ticagrelor   90 mg Oral BID    Infusions:    PRN Medications: sodium chloride , acetaminophen , guaiFENesin -dextromethorphan , ondansetron  (ZOFRAN ) IV, mouth rinse, prochlorperazine , sodium chloride  flush, sodium chloride  flush  Assessment/Plan   1. Acute Systolic Heart Failure  Cardiogenic shock: Shock resolved.Due to late presentation of anterior STEMI, severely reduced EF on LV gram. Echo EF 25-30%, RV ok Apex AK, no LV thrombus  - DBA weaned off 8/19 - IABP removed 8/20  - Co-ox 64%. CVP 5 -  no need for diuretics at this time.  - Continue jardiance  10 mg daily - continue entresto  24-26 mg bid, - Continue spiro to 25 mg daily  - continue digoxin  0.125 mg daily - continue corlanor  5 mg bid  - continue toprol  xl 25 mg daily  - seen by CRPI, referred to PII.   2. Anterior STEMI/CAD: Prior history of HTN, minimal other risk factors. Cath w/ 1V occlusive disease, 100%, pLAD. S/p successful PCI, complicated by shock as above. - no further CP - Continue aspirin  + ticagrelor  - Atorvastatin  80 mg daily  - occasional PVCs, short  runs of NSVT. Will arrange for LifeVest at d/c  - Arrange outpatient CR  3. HLD: LDL 124 - Statin as above - LDL Goal < 55   4. Shock Liver - improving - f/u levels in AM   5. Hypertension  - now controlled  - continue HF GDMT titration per above    6. AKI  - resolved   Home today with close outpatient f/u .  Length of Stay: 6  Toribio Fuel, MD  08/25/2023, 11:52 AM  Advanced Heart Failure Team Pager [phone] (M-F; 7a - 5p)  Please contact [facility] for night-coverage after hours (5p -7a ) and weekends on [email or web address]  Patient seen and examined with the above-signed Advanced Practice Provider and/or Housestaff. I personally reviewed laboratory data, imaging studies and relevant notes. I independently examined the patient and formulated the important aspects of the plan. I have edited the note to reflect any of my changes or salient points. I have personally discussed the plan with the patient and/or family.  Doing well off inotropes. Co-ox and CVP stable HR coming down  General: Sitting up No resp difficulty HEENT: normal Neck: supple. no JVD. Carotids 2+ bilat; no bruits. No lymphadenopathy or thryomegaly appreciated. Cor: PMI nondisplaced. Regular rate & rhythm. No rubs, gallops or murmurs. Lungs: clear Abdomen: soft, nontender, nondistended. No hepatosplenomegaly. No bruits or masses. Good bowel sounds. Extremities: no cyanosis, clubbing, rash, edema Neuro: alert & orientedx3, cranial nerves grossly intact. moves all 4 extremities w/o difficulty. Affect pleasant  Plan repeat echo today. Titrated GDMT as above. Place LifeVest. Can go to [facility].   TOC meds d/w PharmD.   Toribio Fuel, MD  11:52 AM

## 2023-08-25 NOTE — TOC Progression Note (Signed)
 Transition of Care [facility]) - Progression Note    Patient Details  Name: Karen Cameron MRN: [id number] Date of Birth: 06-Nov-1965  [facility] of [facility] CM/SW Contact  Robynn Eileen Hoose, RN Phone Number: 08/25/2023, 9:19 AM  Clinical Narrative:   Spoke with patient at bedside, agreeable to 3:1 and expressed need for Rollator. Phone call to [facility] with Life vest to get ETA of fitting. Per Deatrice, fitting should happen sometime this morning. Discussed outpatient PT recommendations, patient preference is [facility] [address]. Outpatient PT Referral # [id number] placed and contact information placed on AVS. DME ordered through Jermaine with [employer] to be delivered to patient bedside before discharging home.     Expected Discharge Plan: Home/Self Care Barriers to Discharge: Continued Medical Work up               Expected Discharge Plan and Services   Discharge Planning Services: CM Consult                     DME Arranged: 3-N-1, Walker rolling with seat DME Agency: [employer] Date DME Agency Contacted: 08/25/23 Time DME Agency Contacted: [phone] Representative spoke with at DME Agency: [place]             Social Drivers of Health (SDOH) Interventions SDOH Screenings   Food Insecurity: No Food Insecurity (08/20/2023)  Housing: Low Risk  (08/20/2023)  Transportation Needs: No Transportation Needs (08/20/2023)  Utilities: Not At Risk (08/20/2023)  Depression (PHQ2-9): Low Risk  (05/21/2023)  Tobacco Use: Low Risk  (08/19/2023)    Readmission Risk Interventions     No data to display

## 2023-08-30 ENCOUNTER — Encounter: Payer: Self-pay | Admitting: Nurse Practitioner

## 2023-08-30 ENCOUNTER — Ambulatory Visit: Admitting: Nurse Practitioner

## 2023-08-30 VITALS — BP 130/60 | HR 94 | Temp 98.1°F | Ht 62.0 in | Wt 130.2 lb

## 2023-08-30 DIAGNOSIS — Z09 Encounter for follow-up examination after completed treatment for conditions other than malignant neoplasm: Secondary | ICD-10-CM

## 2023-08-30 DIAGNOSIS — I10 Essential (primary) hypertension: Secondary | ICD-10-CM

## 2023-08-30 DIAGNOSIS — Z2821 Immunization not carried out because of patient refusal: Secondary | ICD-10-CM | POA: Diagnosis not present

## 2023-08-30 DIAGNOSIS — E7800 Pure hypercholesterolemia, unspecified: Secondary | ICD-10-CM

## 2023-08-30 DIAGNOSIS — F4323 Adjustment disorder with mixed anxiety and depressed mood: Secondary | ICD-10-CM | POA: Diagnosis not present

## 2023-08-30 DIAGNOSIS — I2109 ST elevation (STEMI) myocardial infarction involving other coronary artery of anterior wall: Secondary | ICD-10-CM

## 2023-08-30 NOTE — Progress Notes (Signed)
 LILLETTE Kristeen JINNY Gladis, CMA,acting as a Neurosurgeon for Gaines Ada, FNP.,have documented all relevant documentation on the behalf of Gaines Ada, FNP,as directed by  Gaines Ada, FNP while in the presence of Gaines Ada, FNP.  Subjective:  Patient ID: Karen Cameron , female    DOB: 07/04/1965 , 58 y.o.   MRN: [id number]  Chief Complaint  Patient presents with   Hospitalization Follow-up    Patient presents today for a hospital follow up, patient reports compliance with medications. Patient denies any chest pain, SOB, or headaches. Patient was admitted 08/19/2023 and discharged on 08/25/2023 Patient reports today she is feeling okay she is really tired. She reports she gets really winded when walking- she would like a handicap sticker. She also wants to know if she can take her allergy  pill.       HPI  Discussed the use of AI scribe software for clinical note transcription with the patient, who gave verbal consent to proceed.  History of Present Illness Karen Cameron is a 58 year old female who presents for a hospital f/u from 8/17-8/23 with symptoms following a suspected food poisoning incident however found out she had  She is accompanied by her husband.  She experienced symptoms after attending a cookout, suspecting food poisoning as the cause. She had burning sensations and vomiting, for which she used Phenergan suppositories every six hours. Despite this, her condition did not improve, prompting her husband to advise seeking medical attention.  Upon hospital admission, she underwent an urgent intervention in the cath lab and diagnosed with a widow maker MI.  She recalls receiving morphine  and waking up with a balloon pump. Currently, she is on a life vest and reports feeling winded, especially after exertion. Her ejection fraction is 25% on ECHO.  She uses a rollator for mobility and is not driving. She is now on multiple medications.   Her family history includes heart issues in  her uncle and two aunts, while her mother has diabetes. She is on multiple medications: Corlanor , nitroglycerin , Entresto , Brilinta , aspirin , atorvastatin , Jardiance , and metoprolol .   Past Medical History:  Diagnosis Date   Acute ST elevation myocardial infarction (STEMI) of anterolateral wall (HCC) 08/19/2023   Allergy     Anxiety    Depression    Encounter for annual health examination 05/21/2023   Hypertension    Nausea & vomiting    SAB (spontaneous abortion)    X2   SUI (stress urinary incontinence, female)      Family History  Problem Relation Age of Onset   Diabetes Mother    Cancer Father        HEAD,NECK, THROAT   Diabetes Maternal Aunt    Diabetes Maternal Uncle    Diabetes Paternal Grandmother    Allergic rhinitis Neg Hx    Asthma Neg Hx    Eczema Neg Hx    Urticaria Neg Hx      Current Outpatient Medications:    aspirin  81 MG chewable tablet, Chew 1 tablet (81 mg total) by mouth daily., Disp: 30 tablet, Rfl: 5   atorvastatin  (LIPITOR ) 80 MG tablet, Take 1 tablet (80 mg total) by mouth daily., Disp: 30 tablet, Rfl: 5   empagliflozin  (JARDIANCE ) 10 MG TABS tablet, Take 1 tablet (10 mg total) by mouth daily., Disp: 30 tablet, Rfl: 5   ivabradine  (CORLANOR ) 5 MG TABS tablet, Take 1 tablet (5 mg total) by mouth 2 (two) times daily with a meal., Disp: 60 tablet, Rfl: 5  metoprolol  succinate (TOPROL -XL) 25 MG 24 hr tablet, Take 1 tablet (25 mg total) by mouth daily., Disp: 30 tablet, Rfl: 5   nitroGLYCERIN  (NITROSTAT ) 0.4 MG SL tablet, Place 1 tablet (0.4 mg total) under the tongue every 5 (five) minutes as needed for chest pain., Disp: 100 tablet, Rfl: 3   sacubitril -valsartan  (ENTRESTO ) 24-26 MG, Take 1 tablet by mouth 2 (two) times daily., Disp: 60 tablet, Rfl: 5   spironolactone  (ALDACTONE ) 25 MG tablet, Take 1 tablet (25 mg total) by mouth daily., Disp: 30 tablet, Rfl: 5   ticagrelor  (BRILINTA ) 90 MG TABS tablet, Take 1 tablet (90 mg total) by mouth 2 (two) times  daily., Disp: 60 tablet, Rfl: 5   furosemide  (LASIX ) 20 MG tablet, Take Lasix  20 mg (1 tablet) as needed for weight gain > 3 lbs overnight or > 5 lbs in one week, increased shortness of breath, or increased swelling., Disp: 30 tablet, Rfl: 3   No Known Allergies   Review of Systems  Constitutional: Negative.   HENT: Negative.    Eyes: Negative.   Respiratory: Negative.    Cardiovascular: Negative.   Gastrointestinal: Negative.   Psychiatric/Behavioral: Negative.       Today's Vitals   08/30/23 0955  BP: 130/60  Pulse: 94  Temp: 98.1 F (36.7 C)  TempSrc: Oral  Weight: 130 lb 3.2 oz (59.1 kg)  Height: 5' 2 (1.575 m)  PainSc: 0-No pain   Body mass index is 23.81 kg/m.  Wt Readings from Last 3 Encounters:  09/18/23 129 lb 13.6 oz (58.9 kg)  09/13/23 130 lb 12.8 oz (59.3 kg)  09/04/23 126 lb (57.2 kg)     Objective:  Physical Exam Vitals and nursing note reviewed.  Constitutional:      General: She is not in acute distress.    Appearance: Normal appearance. She is well-developed.  HENT:     Head: Normocephalic and atraumatic.  Eyes:     Pupils: Pupils are equal, round, and reactive to light.  Cardiovascular:     Rate and Rhythm: Normal rate and regular rhythm.     Pulses: Normal pulses.     Heart sounds: Normal heart sounds. No murmur heard.    Comments: She has on a life vest Pulmonary:     Effort: Pulmonary effort is normal. No respiratory distress.     Breath sounds: Normal breath sounds. No wheezing.  Musculoskeletal:        General: Normal range of motion.  Skin:    General: Skin is warm and dry.     Capillary Refill: Capillary refill takes less than 2 seconds.  Neurological:     General: No focal deficit present.     Mental Status: She is alert and oriented to person, place, and time.     Cranial Nerves: No cranial nerve deficit.     Motor: No weakness.  Psychiatric:        Mood and Affect: Mood normal.        Assessment And Plan:  Acute ST  elevation myocardial infarction (STEMI) of anterolateral wall (HCC) Assessment & Plan: ST-elevation myocardial infarction (STEMI), anterior wall, complicated by heart failure with reduced ejection fraction (HFrEF) with recent hospitalization Recent anterior wall STEMI with HFrEF, ejection fraction at 25%. On multiple cardiac medications. Using LifeVest, referred to cardiac rehab. Restricted from driving, using rollator for mobility. - Continue current cardiac medications. - Use nitroglycerin  as needed for angina. - Attend cardiac rehab. - Follow up with cardiologist on Tuesday. -  Use rollator for mobility. - Avoid driving until further notice per Cardiology instructions.  - TCM Performed. A member of the clinical team spoke with the patient upon dischare. Discharge summary was reviewed in full detail during the visit. Meds reconciled and compared to discharge meds. Medication list is updated and reviewed with the patient.  Greater than 50% face to face time was spent in counseling an coordination of care.  All questions were answered to the satisfaction of the patient.      Essential hypertension Assessment & Plan: Blood pressure is controlled.    Situational mixed anxiety and depressive disorder Assessment & Plan: Depression screening is 6 related to her current situation. She has good family support. She is having intermittent episodes of anxiety due to recent diagnosis of MI   COVID-19 vaccination declined  Elevated LDL cholesterol level Assessment & Plan: On atorvastatin  as part of cardiac regimen to manage elevated LDL. - Continue atorvastatin  as prescribed.     Return for keep same next.  Patient was given opportunity to ask questions. Patient verbalized understanding of the plan and was able to repeat key elements of the plan. All questions were answered to their satisfaction.    LILLETTE Gaines Ada, FNP, have reviewed all documentation for this visit. The documentation on  08/30/23 for the exam, diagnosis, procedures, and orders are all accurate and complete.   IF YOU HAVE BEEN REFERRED TO A SPECIALIST, IT MAY TAKE 1-2 WEEKS TO SCHEDULE/PROCESS THE REFERRAL. IF YOU HAVE NOT HEARD FROM US /SPECIALIST IN TWO WEEKS, PLEASE GIVE US  A CALL AT [phone].

## 2023-08-31 ENCOUNTER — Telehealth (HOSPITAL_COMMUNITY): Payer: Self-pay | Admitting: *Deleted

## 2023-08-31 NOTE — Telephone Encounter (Signed)
 Called to confirm/remind patient of their appointment at the [facility] on 09/04/23.      Appointment:              [] Confirmed             [x] Left mess              [] No answer/No voice mail             [] Phone not in service   Patient reminded to bring all medications and/or complete list.   Confirmed patient has transportation. Gave directions, instructed to utilize valet parking.

## 2023-08-31 NOTE — Progress Notes (Addendum)
 ADVANCED HF CLINIC CONSULT NOTE Primary Care: Georgina Speaks, FNP Primary Cardiologist: Dr. Ladona HF Cardiologist: Dr. Zenaida  HPI: Karen Cameron is a 58 y.o. AAF with history of HTN and new diagnosis of CAD and systolic heart failure.  Admitted 8/25 with STEMI. EKG showed anterolateral STEMI with active chest discomfort. Taken urgently to cath lab where cath showed acutely occluded LAD with significant thrombotic lesion. Patient underwent thrombectomy, and PTCA of LAD. Dr Zenaida completed RHC which showed low filling pressures and low CI, IABP placed. Required DBA gtt. Echo showed EF 25-30%, normal RV.  Drips and IABP eventually weaned and GDMT titrated. LifeVest placed at discharge due to runs of VT. She was discharged home, weight 130 lbs.  Today she returns for post hospital HF follow up with her mother and husband. Overall feeling fine. She has fatigue when walking up steps, no dyspnea with activity. Enjoys yoga. Denies palpitations, abnormal bleeding, CP, dizziness, edema, or PND/Orthopnea. Appetite ok. Weight at home 127-129 pounds. Taking all medications. 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 Wearing LifeVest.  Cardiac Studies - Echo (8/25): EF 25-30%, mild LVH, RV normal  - RHC w/ IABP (8/25): RA 2, PA 33/14 (20), PCWP 12, CO/CI (Fick) 3.03/1.9, PVR 2.8 WU, PAPi 9.5   - LHC (8/25): PTCA stenting of mid LAD; IABP placed  Past Medical History:  Diagnosis Date   Acute ST elevation myocardial infarction (STEMI) of anterolateral wall (HCC) 08/19/2023   Allergy     Anxiety    Depression    Encounter for annual health examination 05/21/2023   Hypertension    Nausea & vomiting    SAB (spontaneous abortion)    X2   SUI (stress urinary incontinence, female)    Current Outpatient Medications  Medication Sig Dispense Refill   aspirin  81 MG chewable tablet Chew 1 tablet (81 mg total) by mouth daily. 30 tablet 5   atorvastatin  (LIPITOR ) 80 MG tablet Take 1 tablet (80 mg total) by  mouth daily. 30 tablet 5   empagliflozin  (JARDIANCE ) 10 MG TABS tablet Take 1 tablet (10 mg total) by mouth daily. 30 tablet 5   ivabradine  (CORLANOR ) 5 MG TABS tablet Take 1 tablet (5 mg total) by mouth 2 (two) times daily with a meal. 60 tablet 5   metoprolol  succinate (TOPROL -XL) 25 MG 24 hr tablet Take 1 tablet (25 mg total) by mouth daily. 30 tablet 5   nitroGLYCERIN  (NITROSTAT ) 0.4 MG SL tablet Place 1 tablet (0.4 mg total) under the tongue every 5 (five) minutes as needed for chest pain. 100 tablet 3   sacubitril -valsartan  (ENTRESTO ) 24-26 MG Take 1 tablet by mouth 2 (two) times daily. 60 tablet 5   spironolactone  (ALDACTONE ) 25 MG tablet Take 1 tablet (25 mg total) by mouth daily. 30 tablet 5   ticagrelor  (BRILINTA ) 90 MG TABS tablet Take 1 tablet (90 mg total) by mouth 2 (two) times daily. 60 tablet 5   No current facility-administered medications for this encounter.   No Known Allergies  Social History   Socioeconomic History   Marital status: Married    Spouse name: Not on file   Number of children: Not on file   Years of education: Not on file   Highest education level: Not on file  Occupational History   Not on file  Tobacco Use   Smoking status: Never   Smokeless tobacco: Never  Vaping Use   Vaping status: Never Used  Substance and Sexual Activity   Alcohol use:  Yes    Comment: Socially   Drug use: No   Sexual activity: Yes    Birth control/protection: Surgical    Comment: BTL  Other Topics Concern   Not on file  Social History Narrative   Not on file   Social Drivers of Health   Financial Resource Strain: Not on file  Food Insecurity: No Food Insecurity (08/20/2023)   Hunger Vital Sign    Worried About Running Out of Food in the Last Year: Never true    Ran Out of Food in the Last Year: Never true  Transportation Needs: No Transportation Needs (08/20/2023)   PRAPARE - Administrator, Civil Service (Medical): No    Lack of Transportation  (Non-Medical): No  Physical Activity: Not on file  Stress: Not on file  Social Connections: Not on file  Intimate Partner Violence: Not At Risk (08/20/2023)   Humiliation, Afraid, Rape, and Kick questionnaire    Fear of Current or Ex-Partner: No    Emotionally Abused: No    Physically Abused: No    Sexually Abused: No   Family History  Problem Relation Age of Onset   Diabetes Mother    Cancer Father        HEAD,NECK, THROAT   Diabetes Maternal Aunt    Diabetes Maternal Uncle    Diabetes Paternal Grandmother    Allergic rhinitis Neg Hx    Asthma Neg Hx    Eczema Neg Hx    Urticaria Neg Hx    Wt Readings from Last 3 Encounters:  09/04/23 57.2 kg (126 lb)  08/30/23 59.1 kg (130 lb 3.2 oz)  08/25/23 59.6 kg (131 lb 6.3 oz)   BP 126/68   Pulse 75   Ht 5' 2 (1.575 m)   Wt 57.2 kg (126 lb)   LMP  (LMP Unknown)   SpO2 99%   BMI 23.05 kg/m   PHYSICAL EXAM: General:  NAD. No resp difficulty, walked into clinic HEENT: Normal Neck: Supple. No JVD. Cor: Regular rate & rhythm. No rubs, gallops or murmurs. Lungs: Clear Abdomen: Soft, nontender, nondistended.  Extremities: No cyanosis, clubbing, rash, edema Neuro: Alert & oriented x 3, moves all 4 extremities w/o difficulty. Affect pleasant.  ECG (personally reviewed): low voltage, NSR 78 bpm, anterior TWI  LifeVest interrogation (personally reviewed): no events, average HR 91 bpm, 2229 average daily steps  ASSESSMENT & PLAN: 1. Chronic Systolic Heart Failure: GCS due to late presenting anterior MI.  Severely reduced EF on LV gram. Echo 8/25: EF 25-30%, RV ok Apex AK, no LV thrombus. Required IABP and DBA gtt. - NYHA II, fatigue main symptom. Volume OK, does not need daily loops - Will give Lasix  20 mg for PRN use - Off dig with elevated level. - Continue Jardiance  10 mg daily - Continue Entresto  24-26 mg bid - Continue spiro 25 mg daily  - Continue Corlanor  5 mg bid  - Continue Toprol  XL 25 mg daily  - Referred to CR -  Repeat echo in 3 months. Continue LifeVest until repeat echo - Labs today   2. CAD: Prior history of HTN, minimal other risk factors. Cath w/ 1V occlusive disease, 100%, pLAD. S/p successful PCI, complicated by shock as above. - No further chest pain - Continue aspirin  + ticagrelor  - Continue atorvastatin  80 mg daily  - Continue LifeVest due to short runs of NSVT and occasional PVCs,. - Outpatient CR   3. HLD: LDL 124 - Continue atorvastatin  80 mg daily -  LDL Goal < 55   4. HTN  - BP well controlled - continue HF GDMT as above   Follow up in 3 weeks with APP and 3 months with Dr. Zenaida + echo  Junior, FNP-BC 09/04/23

## 2023-09-04 ENCOUNTER — Other Ambulatory Visit (HOSPITAL_COMMUNITY): Payer: Self-pay

## 2023-09-04 ENCOUNTER — Encounter (HOSPITAL_COMMUNITY): Payer: Self-pay

## 2023-09-04 ENCOUNTER — Ambulatory Visit (HOSPITAL_COMMUNITY): Payer: Self-pay | Admitting: Family Medicine

## 2023-09-04 ENCOUNTER — Ambulatory Visit (HOSPITAL_COMMUNITY)
Admission: RE | Admit: 2023-09-04 | Discharge: 2023-09-04 | Disposition: A | Discharge status: Home / Self Care | Source: Ambulatory Visit | Attending: Family Medicine | Admitting: Family Medicine

## 2023-09-04 VITALS — BP 126/68 | HR 75 | Ht 62.0 in | Wt 126.0 lb

## 2023-09-04 DIAGNOSIS — I110 Hypertensive heart disease with heart failure: Secondary | ICD-10-CM | POA: Diagnosis not present

## 2023-09-04 DIAGNOSIS — Z79899 Other long term (current) drug therapy: Secondary | ICD-10-CM | POA: Insufficient documentation

## 2023-09-04 DIAGNOSIS — I2109 ST elevation (STEMI) myocardial infarction involving other coronary artery of anterior wall: Secondary | ICD-10-CM | POA: Diagnosis not present

## 2023-09-04 DIAGNOSIS — Z7982 Long term (current) use of aspirin: Secondary | ICD-10-CM | POA: Diagnosis not present

## 2023-09-04 DIAGNOSIS — I2510 Atherosclerotic heart disease of native coronary artery without angina pectoris: Secondary | ICD-10-CM | POA: Insufficient documentation

## 2023-09-04 DIAGNOSIS — E785 Hyperlipidemia, unspecified: Secondary | ICD-10-CM | POA: Insufficient documentation

## 2023-09-04 DIAGNOSIS — I10 Essential (primary) hypertension: Secondary | ICD-10-CM | POA: Diagnosis not present

## 2023-09-04 DIAGNOSIS — Z7902 Long term (current) use of antithrombotics/antiplatelets: Secondary | ICD-10-CM | POA: Diagnosis not present

## 2023-09-04 DIAGNOSIS — E7849 Other hyperlipidemia: Secondary | ICD-10-CM

## 2023-09-04 DIAGNOSIS — Z7984 Long term (current) use of oral hypoglycemic drugs: Secondary | ICD-10-CM | POA: Diagnosis not present

## 2023-09-04 DIAGNOSIS — I5022 Chronic systolic (congestive) heart failure: Secondary | ICD-10-CM | POA: Diagnosis not present

## 2023-09-04 LAB — BASIC METABOLIC PANEL WITH GFR
Anion gap: 16 — ABNORMAL HIGH (ref 5–15)
BUN: 12 mg/dL (ref 6–20)
CO2: 20 mmol/L — ABNORMAL LOW (ref 22–32)
Calcium: 9.3 mg/dL (ref 8.9–10.3)
Chloride: 102 mmol/L (ref 98–111)
Creatinine, Ser: 1.11 mg/dL — ABNORMAL HIGH (ref 0.44–1.00)
GFR, Estimated: 59 mL/min — ABNORMAL LOW
Glucose, Bld: 89 mg/dL (ref 70–99)
Potassium: 4.4 mmol/L (ref 3.5–5.1)
Sodium: 138 mmol/L (ref 135–145)

## 2023-09-04 LAB — BRAIN NATRIURETIC PEPTIDE: B Natriuretic Peptide: 702.3 pg/mL — ABNORMAL HIGH (ref 0.0–100.0)

## 2023-09-04 LAB — DIGOXIN LEVEL: Digoxin Level: <0.6 ng/mL — ABNORMAL LOW (ref 0.8–2.0)

## 2023-09-04 MED ORDER — FUROSEMIDE 20 MG PO TABS
ORAL_TABLET | ORAL | 3 refills | Status: AC
  Filled 2023-09-04 – 2023-10-24 (×3): qty 30, 30d supply, fill #0
  Filled 2023-11-21: qty 30, 30d supply, fill #1
  Filled 2023-12-18 – 2024-01-08 (×2): qty 30, 30d supply, fill #2

## 2023-09-04 NOTE — Patient Instructions (Addendum)
 You may take Lasix  20 mg as needed for weight gain > 3 lbs overnight or > 5 lbs in one week, increase shortness of breath, or increased swelling. Rx sent. Labs today - will call you if abnormal. Return to Heart Failure APP Clinic in 4 weeks - see below. Return to see Dr. Bethena with echo in 3 months.  PLEASE CALL [phone] IN NOVEMBER TO SCHEDULE THIS APPOINTMENT. Please call [phone] if any questions or concerns prior to your next appointment.

## 2023-09-04 NOTE — Addendum Note (Signed)
 Encounter addended by: Glena Harlene HERO, FNP on: 09/04/2023 1:11 PM  Actions taken: Clinical Note Signed

## 2023-09-10 ENCOUNTER — Encounter (HOSPITAL_COMMUNITY): Payer: Self-pay

## 2023-09-11 ENCOUNTER — Telehealth (HOSPITAL_COMMUNITY): Payer: Self-pay

## 2023-09-11 NOTE — Telephone Encounter (Signed)
 Pt insurance is active and benefits verified through [employer]. Co-pay $0, DED $600/$142.17 met, out of pocket $7,900/$1,091.77 met, co-insurance 20%. No pre-authorization required. 09/11/2023 @ 10:59am, spoke with Rosalynn, rep forgot to give ref# before transferring me to survey.  TCR/ICR? ICR Visit(date of service)limitation? No Can multiple codes be used on the same date of service/visit?(IF ITS A LIMIT) N/A  Is this a lifetime maximum or an annual maximum? Annual Has the member used any of these services to date? No Is there a time limit (weeks/months) on start of program and/or program completion? No

## 2023-09-12 ENCOUNTER — Telehealth (HOSPITAL_COMMUNITY): Payer: Self-pay

## 2023-09-12 NOTE — Progress Notes (Unsigned)
 OV 09/13/2023  Subjective:  Patient ID: Karen Cameron, female , DOB: 24-Jun-1965 , age 59 y.o. , MRN: [id number] , ADDRESS: [address] [place] [address] PCP Georgina Speaks, FNP Patient Care Team: Georgina Speaks, FNP as PCP - General (General Practice)  This Provider for this visit: Treatment Team:  Attending Provider: Georgina Speaks, FNP    09/13/2023 -   Chief Complaint  Patient presents with   Consult    Chronic cough Pt c/o random chronic dry cough present for several years. Pt has tried several practices already. Not worse at any point in the day.      HPI Karen Cameron 58 y.o. -presents for chronic cough.  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  She is here because she has had 10 years of insidious onset of chronic cough that is moderate in intensity affecting her quality of life.  Not necessarily worse.  Stable since onset.  Cough is only very occasionally present at night and wakes her up but but the predominant component of cough is in the daytime.  It occurs randomly and what she describes as fits of cough.  There is no associated wheezing she does feel a tickle in the throat and she feels that something irritating her throat.  However lozenges are talking or drinking water do not make it better or worse.  There is no associated shortness of breath.  There is no associated wheezing.  Exam nitric oxide  test today is normal at 5 ppb  Cough associated risk factors - GI: She got evaluated by Dr. Dianna 5 years ago was placed on proton pump inhibitor but no effect - Allergy  workup: She says she has skin test positivity for mold and cat perhaps.  She says she took some treatment but no effect on cough - ENT: She saw Dr. Roark 5 years ago and her his recommendations have not helped her cough. Medications: She is on ACE inhibitor associated with Entresto  since August 2025 but this has not changed her cough - Lung parenchyma: I do not  see CT scan images of the lung.  -Significant past medical: August 2025 she got chest pain and vomiting while at a cookout.  She did not present to the emergency department had new onset MI with systolic heart failure.  She has a LifeVest right now and her defibrillator is being considered.  Dr. Morene Brownie is her heart failure specialist.  She status post cardiac stent 100% LAD lesion.  External records reviewed.  -Her BNP is still high as of a week ago.     Dr Felice Reflux Symptom Index (> 13-15 suggestive of LPR cough) 0 -> 5  =  none ->severe problem 09/13/2023 FENO 5ppb and NORMAL  Hoarseness of problem with voice 0  Clearing  Of Throat 1  Excess throat mucus or feeling of post nasal drip 0  Difficulty swallowing food, liquid or tablets 0  Cough after eating or lying down 3  Breathing difficulties or choking episodes 0  Troublesome or annoying cough 5  Sensation of something sticking in throat or lump in throat 1  Heartburn, chest pain, indigestion, or stomach acid coming up 1  TOTAL 11      PFT      No data to display          Latest Reference Range & Units 08/25/08 14:00 10/22/08 09:06 10/05/09 09:00 04/22/18 16:18 05/16/22 10:26  Eosinophils Absolute 0.0 -  0.7 K/uL 0.0 0.1 0.0    Eos Not Estab. %    2 1  EOS (ABSOLUTE) 0.0 - 0.4 x10E3/uL    0.1 0.1      LAB RESULTS last 96 hours No results found.    Latest Reference Range & Units 09/04/23 12:37  B Natriuretic Peptide 0.0 - 100.0 pg/mL 702.3 (H)  (H): Data is abnormally high   Latest Reference Range & Units 08/25/08 14:00 10/22/08 09:06 10/05/09 09:00 02/24/10 14:38 02/24/10 21:05 04/22/18 16:18 05/05/21 10:43 05/16/22 10:26 08/19/23 09:30 08/19/23 09:48 08/19/23 11:39 08/19/23 11:43 08/20/23 04:24 08/21/23 04:04 08/22/23 04:11 08/23/23 04:39 08/24/23 05:10 08/25/23 04:40  Hemoglobin 12.0 - 15.0 g/dL 87.8 85.9 87.6 83.9 (H) 17.3 (H) 13.0 14.4 13.9 15.1 (H) 16.7 (H) 14.3 14.3 12.0 10.5 (L) 9.6 (L) 10.2  (L) 9.5 (L) 9.9 (L)  (H): Data is abnormally high (L): Data is abnormally low  has a past medical history of Acute ST elevation myocardial infarction (STEMI) of anterolateral wall (HCC) (08/19/2023), Allergy , Anxiety, Depression, Encounter for annual health examination (05/21/2023), Hypertension, Nausea & vomiting, SAB (spontaneous abortion), and SUI (stress urinary incontinence, female).   reports that she has never smoked. She has never used smokeless tobacco.  Past Surgical History:  Procedure Laterality Date   CHOLECYSTECTOMY     CORONARY THROMBECTOMY N/A 08/19/2023   Procedure: Coronary Thrombectomy;  Surgeon: Ladona Heinz, MD;  Location: [facility] INVASIVE CV LAB;  Service: Cardiovascular;  Laterality: N/A;   CORONARY/GRAFT ACUTE MI REVASCULARIZATION N/A 08/19/2023   Procedure: Coronary/Graft Acute MI Revascularization;  Surgeon: Ladona Heinz, MD;  Location: [facility] INVASIVE CV LAB;  Service: Cardiovascular;  Laterality: N/A;   DILATION AND CURETTAGE OF UTERUS     IABP INSERTION N/A 08/19/2023   Procedure: IABP Insertion;  Surgeon: Ladona Heinz, MD;  Location: [facility] INVASIVE CV LAB;  Service: Cardiovascular;  Laterality: N/A;   RIGHT HEART CATH N/A 08/19/2023   Procedure: RIGHT HEART CATH;  Surgeon: Zenaida Morene PARAS, MD;  Location: [facility] INVASIVE CV LAB;  Service: Cardiovascular;  Laterality: N/A;   TUBAL LIGATION      No Known Allergies  Immunization History  Administered Date(s) Administered   Influenza-Unspecified 10/01/2016, 10/17/2018, 10/13/2020, 10/20/2021   PFIZER(Purple Top)SARS-COV-2 Vaccination 01/03/2019, 03/03/2019, 11/21/2019   Tdap 05/05/2021   Zoster Recombinant(Shingrix ) 11/15/2021, 05/16/2022    Family History  Problem Relation Age of Onset   Diabetes Mother    Cancer Father        HEAD,NECK, THROAT   Diabetes Maternal Aunt    Diabetes Maternal Uncle    Diabetes Paternal Grandmother    Allergic rhinitis Neg Hx    Asthma Neg Hx    Eczema Neg Hx    Urticaria Neg Hx       Current Outpatient Medications:    aspirin  81 MG chewable tablet, Chew 1 tablet (81 mg total) by mouth daily., Disp: 30 tablet, Rfl: 5   atorvastatin  (LIPITOR ) 80 MG tablet, Take 1 tablet (80 mg total) by mouth daily., Disp: 30 tablet, Rfl: 5   empagliflozin  (JARDIANCE ) 10 MG TABS tablet, Take 1 tablet (10 mg total) by mouth daily., Disp: 30 tablet, Rfl: 5   furosemide  (LASIX ) 20 MG tablet, Take Lasix  20 mg (1 tablet) as needed for weight gain > 3 lbs overnight or > 5 lbs in one week, increased shortness of breath, or increased swelling., Disp: 30 tablet, Rfl: 3   ivabradine  (CORLANOR ) 5 MG TABS tablet, Take 1 tablet (5 mg total) by mouth 2 (two) times  daily with a meal., Disp: 60 tablet, Rfl: 5   metoprolol  succinate (TOPROL -XL) 25 MG 24 hr tablet, Take 1 tablet (25 mg total) by mouth daily., Disp: 30 tablet, Rfl: 5   nitroGLYCERIN  (NITROSTAT ) 0.4 MG SL tablet, Place 1 tablet (0.4 mg total) under the tongue every 5 (five) minutes as needed for chest pain., Disp: 100 tablet, Rfl: 3   sacubitril -valsartan  (ENTRESTO ) 24-26 MG, Take 1 tablet by mouth 2 (two) times daily., Disp: 60 tablet, Rfl: 5   spironolactone  (ALDACTONE ) 25 MG tablet, Take 1 tablet (25 mg total) by mouth daily., Disp: 30 tablet, Rfl: 5   ticagrelor  (BRILINTA ) 90 MG TABS tablet, Take 1 tablet (90 mg total) by mouth 2 (two) times daily., Disp: 60 tablet, Rfl: 5      Objective:   Vitals:   09/13/23 1402  BP: 94/60  Pulse: 84  SpO2: 100%  Weight: 130 lb 12.8 oz (59.3 kg)  Height: 5' 2 (1.575 m)    Estimated body mass index is 23.92 kg/m as calculated from the following:   Height as of this encounter: 5' 2 (1.575 m).   Weight as of this encounter: 130 lb 12.8 oz (59.3 kg).  @WEIGHTCHANGE @  [employer]   09/13/23 1402  Weight: 130 lb 12.8 oz (59.3 kg)     Physical Exam   General: No distress. Look s well O2 at rest Cane present: no Sitting in wheel chair: no Frail: no Obese: no Neuro: Alert and  Oriented x 3. GCS 15. Speech normal Psych: Pleasant Resp:  Barrel Chest - no.  Wheeze - no, Crackles - no, No overt respiratory distress CVS: Normal heart sounds. 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 PEERL +. No post nasal drip        Assessment/     Assessment & Plan Chronic cough  Irritable larynx syndrome    PLAN Patient Instructions     ICD-10-CM   1. Chronic cough  R05.3 POCT EXHALED NITRIC OXIDE     2. Irritable larynx syndrome  J38.7       You have chronic refractory cough.  The reason for this is not fully known at this point.  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  Since prior allergy  workup, GI workup and ENT workup have not helped your cough.  Noted - you are on entresto  but this is not cause of your cough   PlaN - Do CBC with differential and blood IgE [broad allergy  workup to see if there are any targets to treat] - Do high-resolution CT chest supine and prone - Do full pulmonary function test - Start empiric gabapentin  - I written to Dr. Zenaida your cardiologist to ensure there is no contraindication.  Once he gives clearance we will send in prescription -Meanwhile follow nonpharmaceutical measures to help your cough  - Take 2-3 days of total voice rest while not talking or whispering and during this time drink a lot of water  - Every time you have the urge to cough drink water or swallow saliva or suck on sugarless lozenge  - Refer [facility]hab [facility]  Follow-up - 8 weeks Dr. Geronimo but after completing all of the above.    FOLLOWUP    Return in about 8 months (around 05/12/2024) for 15 min visit, with Dr Geronimo, Face to Face OR Video Visit.    SIGNATURE    Dr. Dorethia  Geronimo, M.D., F.C.C.P,  Pulmonary and Critical Care Medicine Staff Physician, [facility] Director - Interstitial Lung Disease  Program  Pulmonary  Fibrosis [facility] at [facility] [place], [place], [address]  Pager: [phone], If no answer or between  15:00h - 7:00h: call [phone] Telephone: [phone]  2:50 PM 09/13/2023

## 2023-09-12 NOTE — Telephone Encounter (Signed)
 Patient called back to get scheduled in the Cardiac Rehab Program. Patient will come in for orientation on 9/16 and will attend the 12:30 exercise class.  Sent MyChart message.

## 2023-09-12 NOTE — Telephone Encounter (Signed)
 Attempted to call patient regarding cardiac rehab- no answer, left message. Sent MyChart message.

## 2023-09-13 ENCOUNTER — Telehealth: Payer: Self-pay | Admitting: Internal Medicine

## 2023-09-13 ENCOUNTER — Encounter: Payer: Self-pay | Admitting: Internal Medicine

## 2023-09-13 ENCOUNTER — Ambulatory Visit: Admitting: Internal Medicine

## 2023-09-13 VITALS — BP 94/60 | HR 84 | Ht 62.0 in | Wt 130.8 lb

## 2023-09-13 DIAGNOSIS — R053 Chronic cough: Secondary | ICD-10-CM | POA: Diagnosis not present

## 2023-09-13 DIAGNOSIS — J387 Other diseases of larynx: Secondary | ICD-10-CM

## 2023-09-13 LAB — POCT EXHALED NITRIC OXIDE: FeNO level (ppb): 5

## 2023-09-13 NOTE — Patient Instructions (Addendum)
 ICD-10-CM   1. Chronic cough  R05.3 POCT EXHALED NITRIC OXIDE     2. Irritable larynx syndrome  J38.7       You have chronic refractory cough.  The reason for this is not fully known at this point.  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  Since prior allergy  workup, GI workup and ENT workup have not helped your cough.  Noted - you are on entresto  but this is not cause of your cough   PlaN - Do CBC with differential and blood IgE [broad allergy  workup to see if there are any targets to treat] - Do high-resolution CT chest supine and prone - Do full pulmonary function test - Start empiric gabapentin  - I written to Dr. Zenaida your cardiologist to ensure there is no contraindication.  Once he gives clearance we will send in prescription -Meanwhile follow nonpharmaceutical measures to help your cough  - Take 2-3 days of total voice rest while not talking or whispering and during this time drink a lot of water  - Every time you have the urge to cough drink water or swallow saliva or suck on sugarless lozenge  - Refer [facility] rehab [facility]  Follow-up - 8 weeks Dr. Geronimo but after completing all of the above.

## 2023-09-13 NOTE — Telephone Encounter (Signed)
 Hi Karen Cameron   Karen Cameron -systolic heart failure.  She is came to see me for chronic cough that has been going on for a decade.  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  Question - Is it okay for me to prescribe gabapentin at 300 mg 3 times daily?  I can also trial low-dose  Thanks      SIGNATURE    Dr. Dorethia Cave, M.D., F.C.C.P,  Pulmonary and Critical Care Medicine Staff Physician, [facility] Center Director - Interstitial Lung Disease  Program  Pulmonary Fibrosis [facility] at [facility] [place], [place], [address]   Pager: [phone], If no answer  -> Check AMION or Try [phone] Telephone (clinical office): [phone] Telephone (research): [phone]  2:34 PM 09/13/2023

## 2023-09-14 LAB — CBC WITH DIFFERENTIAL/PLATELET
Basophils Absolute: 0.1 K/uL (ref 0.0–0.1)
Basophils Relative: 1.4 % (ref 0.0–3.0)
Eosinophils Absolute: 0.1 K/uL (ref 0.0–0.7)
Eosinophils Relative: 2.6 % (ref 0.0–5.0)
HCT: 35.6 % — ABNORMAL LOW (ref 36.0–46.0)
Hemoglobin: 11.6 g/dL — ABNORMAL LOW (ref 12.0–15.0)
Lymphocytes Relative: 35.1 % (ref 12.0–46.0)
Lymphs Abs: 1.7 K/uL (ref 0.7–4.0)
MCHC: 32.5 g/dL (ref 30.0–36.0)
MCV: 88.7 fl (ref 78.0–100.0)
Monocytes Absolute: 0.5 K/uL (ref 0.1–1.0)
Monocytes Relative: 10.7 % (ref 3.0–12.0)
Neutro Abs: 2.4 K/uL (ref 1.4–7.7)
Neutrophils Relative %: 50.2 % (ref 43.0–77.0)
Platelets: 397 K/uL (ref 150.0–400.0)
RBC: 4.01 Mil/uL (ref 3.87–5.11)
RDW: 14.1 % (ref 11.5–15.5)
WBC: 4.7 K/uL (ref 4.0–10.5)

## 2023-09-14 LAB — IGE: IgE (Immunoglobulin E), Serum: 324 kU/L — ABNORMAL HIGH (ref <=114)

## 2023-09-15 ENCOUNTER — Other Ambulatory Visit: Payer: Self-pay

## 2023-09-15 ENCOUNTER — Ambulatory Visit: Attending: Internal Medicine

## 2023-09-15 DIAGNOSIS — M6281 Muscle weakness (generalized): Secondary | ICD-10-CM | POA: Insufficient documentation

## 2023-09-15 DIAGNOSIS — I219 Acute myocardial infarction, unspecified: Secondary | ICD-10-CM | POA: Insufficient documentation

## 2023-09-15 NOTE — Therapy (Signed)
 OUTPATIENT PHYSICAL THERAPY LOWER EXTREMITY EVALUATION   Patient Name: Karen Cameron MRN: [id number] DOB:02-Sep-1965, 58 y.o., female Today's Date: 09/15/2023  END OF SESSION:  PT End of Session - 09/15/23 1048     Visit Number 1    Date for PT Re-Evaluation 11/10/23    Authorization Type [employer]    Authorization Time Period No auth required    PT Start Time 0853    PT Stop Time 0935    PT Time Calculation (min) 42 min    Activity Tolerance Patient tolerated treatment well    Behavior During Therapy [facility] for tasks assessed/performed          Past Medical History:  Diagnosis Date   Acute ST elevation myocardial infarction (STEMI) of anterolateral wall (HCC) 08/19/2023   Allergy     Anxiety    Depression    Encounter for annual health examination 05/21/2023   Hypertension    Nausea & vomiting    SAB (spontaneous abortion)    X2   SUI (stress urinary incontinence, female)    Past Surgical History:  Procedure Laterality Date   CHOLECYSTECTOMY     CORONARY THROMBECTOMY N/A 08/19/2023   Procedure: Coronary Thrombectomy;  Surgeon: Ladona Heinz, MD;  Location: [facility] INVASIVE CV LAB;  Service: Cardiovascular;  Laterality: N/A;   CORONARY/GRAFT ACUTE MI REVASCULARIZATION N/A 08/19/2023   Procedure: Coronary/Graft Acute MI Revascularization;  Surgeon: Ladona Heinz, MD;  Location: [facility] INVASIVE CV LAB;  Service: Cardiovascular;  Laterality: N/A;   DILATION AND CURETTAGE OF UTERUS     IABP INSERTION N/A 08/19/2023   Procedure: IABP Insertion;  Surgeon: Ladona Heinz, MD;  Location: [facility] INVASIVE CV LAB;  Service: Cardiovascular;  Laterality: N/A;   RIGHT HEART CATH N/A 08/19/2023   Procedure: RIGHT HEART CATH;  Surgeon: Zenaida Morene JINNY, MD;  Location: [facility] INVASIVE CV LAB;  Service: Cardiovascular;  Laterality: N/A;   TUBAL LIGATION     Patient Active Problem List   Diagnosis Date Noted   Acute ST elevation myocardial infarction (STEMI) of anterolateral wall (HCC) 08/19/2023    Acute ST elevation myocardial infarction (STEMI) of anterior wall (HCC) 08/19/2023   Encounter for annual health examination 05/21/2023   Ganglion cyst of wrist, right 09/18/2022   COVID-19 vaccination declined 09/18/2022   Essential hypertension 11/08/2021   Elevated LDL cholesterol level 11/08/2021   Other allergic rhinitis 05/09/2018   Chronic cough 05/08/2018   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 Dr. Zenaida referred her to both cardiac rehab and to outpatient PT. Patient reports she was at a cookout and started having chest pains and was throwing up. Patient thought she was having heart burn or food poisoning. Went to ED next day and was found to be having an MI. Patient reports that she is able to complete simple house chores but is unable to return to recreational activities such as pickle ball, yoga, and line dancing. Patient reports that walking more than 15-20 minutes she will get very fatigued.   PERTINENT HISTORY: Chronic hypertension, right heart cath, is wearing life vest external defibrillator  PAIN:  Are you having pain? No  PRECAUTIONS: Other:  Universal  RED FLAGS: None   WEIGHT BEARING RESTRICTIONS: No  FALLS:  Has patient fallen in last 6 months? No  LIVING ENVIRONMENT: Lives with: lives with their family Lives in: Other town Stairs: Yes: Internal: more than 10 steps; on left going up Has following equipment at home: Environmental consultant - 4 wheeled  OCCUPATION: Currently unemployed; case Production designer, theatre/television/film at [employer]  PLOF: Independent  PATIENT GOALS: go back to pickle ball, line dancing,  and work  NEXT MD VISIT: Kurt of date  OBJECTIVE:  Note: Objective measures were completed at Evaluation unless otherwise noted.  DIAGNOSTIC FINDINGS: Patient presents with decreased endurance/activity tolerance due to MI   COGNITION: Overall cognitive status: Within functional limits for tasks assessed     SENSATION: WFL    POSTURE: rounded shoulders  PALPATION: No TTP per patient complaint  LOWER EXTREMITY ROM:  All LE ROM WNL  (Blank rows = not tested)  LOWER EXTREMITY MMT:  MMT Right eval Left eval  Hip flexion 4 4  Hip extension    Hip abduction 4- 4  Hip adduction    Hip internal rotation    Hip external rotation    Knee flexion 4 4  Knee extension 4 4  Ankle dorsiflexion    Ankle plantarflexion    Ankle inversion    Ankle eversion     (Blank rows = not tested)   FUNCTIONAL TESTS:  30 seconds chair stand test 6 minute walk test: 1072 feet  30 second chair stand 18 reps but some half reps not standing with full knee extension  GAIT: Distance walked: 1072 feet Assistive device utilized: None Level of assistance: Complete Independence Comments: Walks with decreased right LE swing and increased lateral sway                                                                                                                                TREATMENT DATE: 09/15/2023    PATIENT EDUCATION:  Education details: Discussed objective findings with patient. Discussed POC including frequency, HEP, and anatomy/physiology of present condition Person educated: Patient Education method: Explanation, Demonstration, and Handouts Education comprehension: verbalized understanding, returned demonstration, and needs further education  HOME EXERCISE PROGRAM: Access Code: [email or web address] URL: [email or web address] Date: 09/15/2023 Prepared by: Alfonse Cords Laray Corbit  Exercises - Runner's Step up with Arms Forward  - 2 x daily - 7 x weekly - 3 sets - 10 reps -  Resisted Sit-to-Stand With Dumbbell Held in Shoulder Flexion  - 2 x daily - 7 x weekly - 3 sets - 10 reps - Side Stepping with Resistance at Ankles  - 2 x daily - 7 x weekly - 3 sets - 10 reps  ASSESSMENT:  CLINICAL IMPRESSION: Patient is a 58 y.o. female who was seen today for physical therapy evaluation and treatment for s/p acute MI and heart failure. Patient presents to therapy without complaints of pain. However, patient presents with altered gait pattern and significant decrease  in endurance and functional mobility. Patient is unable to navigate stairs or perform recreational activities due to decreased activity tolerance. Patient requires skilled PT services to improve endurance and return to prior level of function.   OBJECTIVE IMPAIRMENTS: decreased endurance, difficulty walking, and decreased strength.   ACTIVITY LIMITATIONS: carrying, squatting, and recreational activities  PARTICIPATION LIMITATIONS: occupation  PERSONAL FACTORS: 1-2 comorbidities: acute MI and history of heart failure are also affecting patient's functional outcome.   REHAB POTENTIAL: Good  CLINICAL DECISION MAKING: Stable/uncomplicated  EVALUATION COMPLEXITY: Low   GOALS: Goals reviewed with patient? No  SHORT TERM GOALS: Target date: 10/13/2023   Patient will be independent with HEP to improve carryover of sessions Baseline: See above Goal status: INITIAL  2.  Patient will be able to ascend and descend stairs in reciprocal pattern without restrictions to be able to access home Baseline: Unable to navigate stairs to access second floor of home Goal status: INITIAL  3.  Patient will be able to walk greater than 20 minutes without rest break to perform IADLs such as grocery shopping Baseline: Unable to go grocery shopping greater than 15 minutes Goal status: INITIAL   LONG TERM GOALS: Target date: 11/10/2023   Patient will be able to walk greater than 30 minutes without need for rest break to  return to work Baseline: Unable to walk more than 15 minutes and is required to walk throughout work day Goal status: INITIAL  2.  Patient will be able to run and play pickle ball at least 1 hour to return to recreational activities  Baseline: Unable to participate Goal status: INITIAL   PLAN:  PT FREQUENCY: 2x/week  PT DURATION: 8 weeks  PLANNED INTERVENTIONS: 97164- PT Re-evaluation, 97750- Physical Performance Testing, 97110-Therapeutic exercises, 97530- Therapeutic activity, 97112- Neuromuscular re-education, 97535- Self Care, 02859- Manual therapy, [phone]- Gait training, [phone]- Aquatic Therapy, Patient/Family education, Balance training, Stair training, Taping, Joint mobilization, Joint manipulation, Cryotherapy, Moist heat, and Biofeedback  PLAN FOR NEXT SESSION: Continue with skilled PT services   Alfonse Nadine PARAS Amandalynn Pitz, PT, DPT 09/15/2023, 10:50 AM

## 2023-09-18 ENCOUNTER — Encounter (HOSPITAL_COMMUNITY)
Admission: RE | Admit: 2023-09-18 | Discharge: 2023-09-18 | Disposition: A | Discharge status: Home / Self Care | Source: Ambulatory Visit | Attending: Cardiology | Admitting: Cardiology

## 2023-09-18 VITALS — BP 92/62 | HR 88 | Ht 63.0 in | Wt 129.9 lb

## 2023-09-18 DIAGNOSIS — I213 ST elevation (STEMI) myocardial infarction of unspecified site: Secondary | ICD-10-CM | POA: Insufficient documentation

## 2023-09-18 DIAGNOSIS — Z955 Presence of coronary angioplasty implant and graft: Secondary | ICD-10-CM | POA: Insufficient documentation

## 2023-09-18 DIAGNOSIS — I2102 ST elevation (STEMI) myocardial infarction involving left anterior descending coronary artery: Secondary | ICD-10-CM

## 2023-09-18 NOTE — Progress Notes (Signed)
 Cardiac Individual Treatment Plan  Patient Details  Name: Karen Cameron MRN: [id number] Date of Birth: 1965-05-21 Referring Provider:   Flowsheet Row INTENSIVE CARDIAC REHAB ORIENT from 09/18/2023 in [facility]  Referring Provider Morene Brownie, MD    Initial Encounter Date:  Flowsheet Row INTENSIVE CARDIAC REHAB ORIENT from 09/18/2023 in [facility]  Date 09/18/23    Visit Diagnosis: 08/19/23 ST elevation myocardial infarction involving left anterior descending (LAD) coronary artery (HCC)  08/19/23 Status post coronary artery stent placement  Patient's Home Medications on Admission:  Current Outpatient Medications:    aspirin  81 MG chewable tablet, Chew 1 tablet (81 mg total) by mouth daily., Disp: 30 tablet, Rfl: 5   atorvastatin  (LIPITOR ) 80 MG tablet, Take 1 tablet (80 mg total) by mouth daily., Disp: 30 tablet, Rfl: 5   empagliflozin  (JARDIANCE ) 10 MG TABS tablet, Take 1 tablet (10 mg total) by mouth daily., Disp: 30 tablet, Rfl: 5   furosemide  (LASIX ) 20 MG tablet, Take Lasix  20 mg (1 tablet) as needed for weight gain > 3 lbs overnight or > 5 lbs in one week, increased shortness of breath, or increased swelling., Disp: 30 tablet, Rfl: 3   ivabradine  (CORLANOR ) 5 MG TABS tablet, Take 1 tablet (5 mg total) by mouth 2 (two) times daily with a meal., Disp: 60 tablet, Rfl: 5   metoprolol  succinate (TOPROL -XL) 25 MG 24 hr tablet, Take 1 tablet (25 mg total) by mouth daily., Disp: 30 tablet, Rfl: 5   nitroGLYCERIN  (NITROSTAT ) 0.4 MG SL tablet, Place 1 tablet (0.4 mg total) under the tongue every 5 (five) minutes as needed for chest pain., Disp: 100 tablet, Rfl: 3   sacubitril -valsartan  (ENTRESTO ) 24-26 MG, Take 1 tablet by mouth 2 (two) times daily., Disp: 60 tablet, Rfl: 5   spironolactone  (ALDACTONE ) 25 MG tablet, Take 1 tablet (25 mg total) by mouth daily., Disp: 30  tablet, Rfl: 5   ticagrelor  (BRILINTA ) 90 MG TABS tablet, Take 1 tablet (90 mg total) by mouth 2 (two) times daily., Disp: 60 tablet, Rfl: 5  Past Medical History: Past Medical History:  Diagnosis Date   Acute ST elevation myocardial infarction (STEMI) of anterolateral wall (HCC) 08/19/2023   Allergy     Anxiety    Depression    Encounter for annual health examination 05/21/2023   Hypertension    Nausea & vomiting    SAB (spontaneous abortion)    X2   SUI (stress urinary incontinence, female)     Tobacco Use: Social History   Tobacco Use  Smoking Status Never  Smokeless Tobacco Never    Labs: Review Flowsheet  More data exists      Latest Ref Rng & Units 08/21/2023 08/22/2023 08/23/2023 08/24/2023 08/25/2023  Labs for ITP Cardiac and Pulmonary Rehab  O2 Saturation % 60.7  79  61.4  63.9  56.5  65.1  63.9     Details       Multiple values from one day are sorted in reverse-chronological order         Capillary Blood Glucose: Lab Results  Component Value Date   GLUCAP 154 (H) 08/19/2023   GLUCAP 149 (H) 08/19/2023     Exercise Target Goals: Exercise Program Goal: Individual exercise prescription set using results from initial 6 min walk test and THRR while considering  patient's activity barriers and safety.   Exercise Prescription Goal: Initial exercise prescription builds  to 30-45 minutes a day of aerobic activity, 2-3 days per week.  Home exercise guidelines will be given to patient during program as part of exercise prescription that the participant will acknowledge.  Activity Barriers & Risk Stratification:   6 Minute Walk:  6 Minute Walk     Row Name 09/18/23 1103         6 Minute Walk   Phase Initial     Distance 1343 feet     Walk Time 6 minutes     # of Rest Breaks 0     MPH 2.5     METS 3.8     RPE 8     Perceived Dyspnea  0     VO2 Peak 13.34     Symptoms No     Resting HR 88 bpm     Resting BP 92/62     Resting Oxygen Saturation   100 %     Max Ex. HR 115 bpm     Max Ex. BP 98/60     2 Minute Post BP 94/60        Oxygen Initial Assessment:   Oxygen Re-Evaluation:   Oxygen Discharge (Final Oxygen Re-Evaluation):   Initial Exercise Prescription:  Initial Exercise Prescription - 09/18/23 1100       Date of Initial Exercise RX and Referring Provider   Date 09/18/23    Referring Provider Morene Brownie, MD    Expected Discharge Date 12/14/23      Treadmill   MPH 1.8    Grade 0.5    Minutes 15    METs 2.5      Bike   Level 1    Watts 22    Minutes 15    METs 2.2      Prescription Details   Frequency (times per week) 3 days/week    Duration Progress to 30 minutes of continuous aerobic without signs/symptoms of physical distress      Intensity   THRR 40-80% of Max Heartrate 66-132    Ratings of Perceived Exertion 11-13    Perceived Dyspnea 0-4      Progression   Progression Continue to progress workloads to maintain intensity without signs/symptoms of physical distress.      Resistance Training   Training Prescription Yes    Weight 3    Reps 10-15          Perform Capillary Blood Glucose checks as needed.  Exercise Prescription Changes:   Exercise Comments:   Exercise Comments     Row Name 09/18/23 1108           Exercise Comments Pt orientation, pt completed w/o unsual signs or symptoms. Pt mainted SR throughout test.          Exercise Goals and Review:   Exercise Goals     Row Name 09/18/23 1107             Exercise Goals   Increase Physical Activity Yes       Intervention Develop an individualized exercise prescription for aerobic and resistive training based on initial evaluation findings, risk stratification, comorbidities and participant's personal goals.;Provide advice, education, support and counseling about physical activity/exercise needs.       Expected Outcomes Short Term: Attend rehab on a regular basis to increase amount of physical  activity.;Long Term: Add in home exercise to make exercise part of routine and to increase amount of physical activity.;Long Term: Exercising regularly at least 3-5 days a week.  Increase Strength and Stamina Yes       Intervention Provide advice, education, support and counseling about physical activity/exercise needs.;Develop an individualized exercise prescription for aerobic and resistive training based on initial evaluation findings, risk stratification, comorbidities and participant's personal goals.       Expected Outcomes Short Term: Increase workloads from initial exercise prescription for resistance, speed, and METs.;Long Term: Improve cardiorespiratory fitness, muscular endurance and strength as measured by increased METs and functional capacity ( );Short Term: Perform resistance training exercises routinely during rehab and add in resistance training at home       Able to understand and use rate of perceived exertion (RPE) scale Yes       Intervention Provide education and explanation on how to use RPE scale       Expected Outcomes Short Term: Able to use RPE daily in rehab to express subjective intensity level;Long Term:  Able to use RPE to guide intensity level when exercising independently       Able to understand and use Dyspnea scale Yes       Intervention Provide education and explanation on how to use Dyspnea scale       Expected Outcomes Long Term: Able to use Dyspnea scale to guide intensity level when exercising independently;Short Term: Able to use Dyspnea scale daily in rehab to express subjective sense of shortness of breath during exertion       Knowledge and understanding of Target Heart Rate Range (THRR) Yes       Intervention Provide education and explanation of THRR including how the numbers were predicted and where they are located for reference       Expected Outcomes Short Term: Able to state/look up THRR;Long Term: Able to use THRR to govern intensity when  exercising independently;Short Term: Able to use daily as guideline for intensity in rehab       Understanding of Exercise Prescription Yes       Intervention Provide education, explanation, and written materials on patient's individual exercise prescription       Expected Outcomes Short Term: Able to explain program exercise prescription;Long Term: Able to explain home exercise prescription to exercise independently          Exercise Goals Re-Evaluation :   Discharge Exercise Prescription (Final Exercise Prescription Changes):   Nutrition:  Target Goals: Understanding of nutrition guidelines, daily intake of sodium 1500mg , cholesterol 200mg , calories 30% from fat and 7% or less from saturated fats, daily to have 5 or more servings of fruits and vegetables.  Biometrics:  Pre Biometrics - 09/18/23 0757       Pre Biometrics   Waist Circumference 30 inches    Hip Circumference 35 inches    Waist to Hip Ratio 0.86 %    Triceps Skinfold 9 mm    % Body Fat 28.1 %    Grip Strength 20 kg    Flexibility 16.25 in    Single Leg Stand 30 seconds           Nutrition Therapy Plan and Nutrition Goals:   Nutrition Assessments:  MEDIFICTS Score Key: >=70 Need to make dietary changes  40-70 Heart Healthy Diet <= 40 Therapeutic Level Cholesterol Diet    Picture Your Plate Scores: <59 Unhealthy dietary pattern with much room for improvement. 41-50 Dietary pattern unlikely to meet recommendations for good health and room for improvement. 51-60 More healthful dietary pattern, with some room for improvement.  >60 Healthy dietary pattern, although there may be some specific  behaviors that could be improved.    Nutrition Goals Re-Evaluation:   Nutrition Goals Re-Evaluation:   Nutrition Goals Discharge (Final Nutrition Goals Re-Evaluation):   Psychosocial: Target Goals: Acknowledge presence or absence of significant depression and/or stress, maximize coping skills, provide  positive support system. Participant is able to verbalize types and ability to use techniques and skills needed for reducing stress and depression.  Initial Review & Psychosocial Screening:  Initial Psych Review & Screening - 09/18/23 0851       Initial Review   Current issues with Current Stress Concerns;Current Anxiety/Panic;Current Depression    Source of Stress Concerns Chronic Illness;Family      Family Dynamics   Good Support System? Yes   Pt has a partner, two sons, mom, and friends in her support system     Barriers   Psychosocial barriers to participate in program The patient should benefit from training in stress management and relaxation.      Screening Interventions   Interventions Encouraged to exercise;To provide support and resources with identified psychosocial needs;Provide feedback about the scores to participant    Expected Outcomes Long Term Goal: Stressors or current issues are controlled or eliminated.;Short Term goal: Identification and review with participant of any Quality of Life or Depression concerns found by scoring the questionnaire.;Long Term goal: The participant improves quality of Life and PHQ9 Scores as seen by post scores and/or verbalization of changes          Quality of Life Scores:  Quality of Life - 09/18/23 1102       Quality of Life   Select Quality of Life      Quality of Life Scores   Health/Function Pre 20.4 %    Socioeconomic Pre 29.14 %    Psych/Spiritual Pre 28.29 %    Family Pre 30 %    GLOBAL Pre 25.24 %         Scores of 19 and below usually indicate a poorer quality of life in these areas.  A difference of  2-3 points is a clinically meaningful difference.  A difference of 2-3 points in the total score of the Quality of Life Index has been associated with significant improvement in overall quality of life, self-image, physical symptoms, and general health in studies assessing change in quality of life.  PHQ-9: Review  Flowsheet  More data exists      09/18/2023 05/21/2023 09/18/2022 05/16/2022 12/13/2020  Depression screen PHQ 2/9  Decreased Interest 1 0 0 0 0  Down, Depressed, Hopeless 1 0 0 0 0  PHQ - 2 Score 2 0 0 0 0  Altered sleeping 0 0 0 3 0  Tired, decreased energy 2 0 0 0 0  Change in appetite 1 0 0 0 0  Feeling bad or failure about yourself  0 0 0 0 0  Trouble concentrating 0 0 0 2 0  Moving slowly or fidgety/restless 1 0 0 0 0  Suicidal thoughts 0 0 0 0 0  PHQ-9 Score 6 0 0 5 0  Difficult doing work/chores Not difficult at all Not difficult at all Not difficult at all Somewhat difficult -   Interpretation of Total Score  Total Score Depression Severity:  1-4 = Minimal depression, 5-9 = Mild depression, 10-14 = Moderate depression, 15-19 = Moderately severe depression, 20-27 = Severe depression   Psychosocial Evaluation and Intervention:   Psychosocial Re-Evaluation:   Psychosocial Discharge (Final Psychosocial Re-Evaluation):   Vocational Rehabilitation: Provide vocational rehab assistance to qualifying  candidates.   Vocational Rehab Evaluation & Intervention:  Vocational Rehab - 09/18/23 1103       Initial Vocational Rehab Evaluation & Intervention   Assessment shows need for Vocational Rehabilitation No   Pt currently employed         Education: Education Goals: Education classes will be provided on a weekly basis, covering required topics. Participant will state understanding/return demonstration of topics presented.     Core Videos: Exercise    Move It!  Clinical staff conducted group or individual video education with verbal and written material and guidebook.  Patient learns the recommended Pritikin exercise program. Exercise with the goal of living a long, healthy life. Some of the health benefits of exercise include controlled diabetes, healthier blood pressure levels, improved cholesterol levels, improved heart and lung capacity, improved sleep, and better body  composition. Everyone should speak with their doctor before starting or changing an exercise routine.  Biomechanical Limitations Clinical staff conducted group or individual video education with verbal and written material and guidebook.  Patient learns how biomechanical limitations can impact exercise and how we can mitigate and possibly overcome limitations to have an impactful and balanced exercise routine.  Body Composition Clinical staff conducted group or individual video education with verbal and written material and guidebook.  Patient learns that body composition (ratio of muscle mass to fat mass) is a key component to assessing overall fitness, rather than body weight alone. Increased fat mass, especially visceral belly fat, can put us  at increased risk for metabolic syndrome, type 2 diabetes, heart disease, and even death. It is recommended to combine diet and exercise (cardiovascular and resistance training) to improve your body composition. Seek guidance from your physician and exercise physiologist before implementing an exercise routine.  Exercise Action Plan Clinical staff conducted group or individual video education with verbal and written material and guidebook.  Patient learns the recommended strategies to achieve and enjoy long-term exercise adherence, including variety, self-motivation, self-efficacy, and positive decision making. Benefits of exercise include fitness, good health, weight management, more energy, better sleep, less stress, and overall well-being.  Medical   Heart Disease Risk Reduction Clinical staff conducted group or individual video education with verbal and written material and guidebook.  Patient learns our heart is our most vital organ as it circulates oxygen, nutrients, white blood cells, and hormones throughout the entire body, and carries waste away. Data supports a plant-based eating plan like the Pritikin Program for its effectiveness in slowing  progression of and reversing heart disease. The video provides a number of recommendations to address heart disease.   Metabolic Syndrome and Belly Fat  Clinical staff conducted group or individual video education with verbal and written material and guidebook.  Patient learns what metabolic syndrome is, how it leads to heart disease, and how one can reverse it and keep it from coming back. You have metabolic syndrome if you have 3 of the following 5 criteria: abdominal obesity, high blood pressure, high triglycerides, low HDL cholesterol, and high blood sugar.  Hypertension and Heart Disease Clinical staff conducted group or individual video education with verbal and written material and guidebook.  Patient learns that high blood pressure, or hypertension, is very common in the [place] . Hypertension is largely due to excessive salt intake, but other important risk factors include being overweight, physical inactivity, drinking too much alcohol, smoking, and not eating enough potassium from fruits and vegetables. High blood pressure is a leading risk factor for heart attack, stroke, congestive heart  failure, dementia, kidney failure, and premature death. Long-term effects of excessive salt intake include stiffening of the arteries and thickening of heart muscle and organ damage. Recommendations include ways to reduce hypertension and the risk of heart disease.  Diseases of Our Time - Focusing on Diabetes Clinical staff conducted group or individual video education with verbal and written material and guidebook.  Patient learns why the best way to stop diseases of our time is prevention, through food and other lifestyle changes. Medicine (such as prescription pills and surgeries) is often only a Band-Aid on the problem, not a long-term solution. Most common diseases of our time include obesity, type 2 diabetes, hypertension, heart disease, and cancer. The Pritikin Program is recommended and has been  proven to help reduce, reverse, and/or prevent the damaging effects of metabolic syndrome.  Nutrition   Overview of the Pritikin Eating Plan  Clinical staff conducted group or individual video education with verbal and written material and guidebook.  Patient learns about the Pritikin Eating Plan for disease risk reduction. The Pritikin Eating Plan emphasizes a wide variety of unrefined, minimally-processed carbohydrates, like fruits, vegetables, whole grains, and legumes. Go, Caution, and Stop food choices are explained. Plant-based and lean animal proteins are emphasized. Rationale provided for low sodium intake for blood pressure control, low added sugars for blood sugar stabilization, and low added fats and oils for coronary artery disease risk reduction and weight management.  Calorie Density  Clinical staff conducted group or individual video education with verbal and written material and guidebook.  Patient learns about calorie density and how it impacts the Pritikin Eating Plan. Knowing the characteristics of the food you choose will help you decide whether those foods will lead to weight gain or weight loss, and whether you want to consume more or less of them. Weight loss is usually a side effect of the Pritikin Eating Plan because of its focus on low calorie-dense foods.  Label Reading  Clinical staff conducted group or individual video education with verbal and written material and guidebook.  Patient learns about the Pritikin recommended label reading guidelines and corresponding recommendations regarding calorie density, added sugars, sodium content, and whole grains.  Dining Out - Part 1  Clinical staff conducted group or individual video education with verbal and written material and guidebook.  Patient learns that restaurant meals can be sabotaging because they can be so high in calories, fat, sodium, and/or sugar. Patient learns recommended strategies on how to positively address  this and avoid unhealthy pitfalls.  Facts on Fats  Clinical staff conducted group or individual video education with verbal and written material and guidebook.  Patient learns that lifestyle modifications can be just as effective, if not more so, as many medications for lowering your risk of heart disease. A Pritikin lifestyle can help to reduce your risk of inflammation and atherosclerosis (cholesterol build-up, or plaque, in the artery walls). Lifestyle interventions such as dietary choices and physical activity address the cause of atherosclerosis. A review of the types of fats and their impact on blood cholesterol levels, along with dietary recommendations to reduce fat intake is also included.  Nutrition Action Plan  Clinical staff conducted group or individual video education with verbal and written material and guidebook.  Patient learns how to incorporate Pritikin recommendations into their lifestyle. Recommendations include planning and keeping personal health goals in mind as an important part of their success.  Healthy Mind-Set    Healthy Minds, Bodies, Hearts  Clinical staff conducted group or  individual video education with verbal and written material and guidebook.  Patient learns how to identify when they are stressed. Video will discuss the impact of that stress, as well as the many benefits of stress management. Patient will also be introduced to stress management techniques. The way we think, act, and feel has an impact on our hearts.  How Our Thoughts Can Heal Our Hearts  Clinical staff conducted group or individual video education with verbal and written material and guidebook.  Patient learns that negative thoughts can cause depression and anxiety. This can result in negative lifestyle behavior and serious health problems. Cognitive behavioral therapy is an effective method to help control our thoughts in order to change and improve our emotional outlook.  Additional  Videos:  Exercise    Improving Performance  Clinical staff conducted group or individual video education with verbal and written material and guidebook.  Patient learns to use a non-linear approach by alternating intensity levels and lengths of time spent exercising to help burn more calories and lose more body fat. Cardiovascular exercise helps improve heart health, metabolism, hormonal balance, blood sugar control, and recovery from fatigue. Resistance training improves strength, endurance, balance, coordination, reaction time, metabolism, and muscle mass. Flexibility exercise improves circulation, posture, and balance. Seek guidance from your physician and exercise physiologist before implementing an exercise routine and learn your capabilities and proper form for all exercise.  Introduction to Yoga  Clinical staff conducted group or individual video education with verbal and written material and guidebook.  Patient learns about yoga, a discipline of the coming together of mind, breath, and body. The benefits of yoga include improved flexibility, improved range of motion, better posture and core strength, increased lung function, weight loss, and positive self-image. Yoga's heart health benefits include lowered blood pressure, healthier heart rate, decreased cholesterol and triglyceride levels, improved immune function, and reduced stress. Seek guidance from your physician and exercise physiologist before implementing an exercise routine and learn your capabilities and proper form for all exercise.  Medical   Aging: Enhancing Your Quality of Life  Clinical staff conducted group or individual video education with verbal and written material and guidebook.  Patient learns key strategies and recommendations to stay in good physical health and enhance quality of life, such as prevention strategies, having an advocate, securing a Health Care Proxy and Power of Attorney, and keeping a list of medications  and system for tracking them. It also discusses how to avoid risk for bone loss.  Biology of Weight Control  Clinical staff conducted group or individual video education with verbal and written material and guidebook.  Patient learns that weight gain occurs because we consume more calories than we burn (eating more, moving less). Even if your body weight is normal, you may have higher ratios of fat compared to muscle mass. Too much body fat puts you at increased risk for cardiovascular disease, heart attack, stroke, type 2 diabetes, and obesity-related cancers. In addition to exercise, following the Pritikin Eating Plan can help reduce your risk.  Decoding Lab Results  Clinical staff conducted group or individual video education with verbal and written material and guidebook.  Patient learns that lab test reflects one measurement whose values change over time and are influenced by many factors, including medication, stress, sleep, exercise, food, hydration, pre-existing medical conditions, and more. It is recommended to use the knowledge from this video to become more involved with your lab results and evaluate your numbers to speak with your doctor.  Diseases of Our Time - Overview  Clinical staff conducted group or individual video education with verbal and written material and guidebook.  Patient learns that according to the CDC, 50% to 70% of chronic diseases (such as obesity, type 2 diabetes, elevated lipids, hypertension, and heart disease) are avoidable through lifestyle improvements including healthier food choices, listening to satiety cues, and increased physical activity.  Sleep Disorders Clinical staff conducted group or individual video education with verbal and written material and guidebook.  Patient learns how good quality and duration of sleep are important to overall health and well-being. Patient also learns about sleep disorders and how they impact health along with  recommendations to address them, including discussing with a physician.  Nutrition  Dining Out - Part 2 Clinical staff conducted group or individual video education with verbal and written material and guidebook.  Patient learns how to plan ahead and communicate in order to maximize their dining experience in a healthy and nutritious manner. Included are recommended food choices based on the type of restaurant the patient is visiting.   Fueling a Banker conducted group or individual video education with verbal and written material and guidebook.  There is a strong connection between our food choices and our health. Diseases like obesity and type 2 diabetes are very prevalent and are in large-part due to lifestyle choices. The Pritikin Eating Plan provides plenty of food and hunger-curbing satisfaction. It is easy to follow, affordable, and helps reduce health risks.  Menu Workshop  Clinical staff conducted group or individual video education with verbal and written material and guidebook.  Patient learns that restaurant meals can sabotage health goals because they are often packed with calories, fat, sodium, and sugar. Recommendations include strategies to plan ahead and to communicate with the manager, chef, or server to help order a healthier meal.  Planning Your Eating Strategy  Clinical staff conducted group or individual video education with verbal and written material and guidebook.  Patient learns about the Pritikin Eating Plan and its benefit of reducing the risk of disease. The Pritikin Eating Plan does not focus on calories. Instead, it emphasizes high-quality, nutrient-rich foods. By knowing the characteristics of the foods, we choose, we can determine their calorie density and make informed decisions.  Targeting Your Nutrition Priorities  Clinical staff conducted group or individual video education with verbal and written material and guidebook.  Patient learns  that lifestyle habits have a tremendous impact on disease risk and progression. This video provides eating and physical activity recommendations based on your personal health goals, such as reducing LDL cholesterol, losing weight, preventing or controlling type 2 diabetes, and reducing high blood pressure.  Vitamins and Minerals  Clinical staff conducted group or individual video education with verbal and written material and guidebook.  Patient learns different ways to obtain key vitamins and minerals, including through a recommended healthy diet. It is important to discuss all supplements you take with your doctor.   Healthy Mind-Set    Smoking Cessation  Clinical staff conducted group or individual video education with verbal and written material and guidebook.  Patient learns that cigarette smoking and tobacco addiction pose a serious health risk which affects millions of people. Stopping smoking will significantly reduce the risk of heart disease, lung disease, and many forms of cancer. Recommended strategies for quitting are covered, including working with your doctor to develop a successful plan.  Culinary   Becoming a Set designer conducted group or  individual video education with verbal and written material and guidebook.  Patient learns that cooking at home can be healthy, cost-effective, quick, and puts them in control. Keys to cooking healthy recipes will include looking at your recipe, assessing your equipment needs, planning ahead, making it simple, choosing cost-effective seasonal ingredients, and limiting the use of added fats, salts, and sugars.  Cooking - Breakfast and Snacks  Clinical staff conducted group or individual video education with verbal and written material and guidebook.  Patient learns how important breakfast is to satiety and nutrition through the entire day. Recommendations include key foods to eat during breakfast to help stabilize blood sugar  levels and to prevent overeating at meals later in the day. Planning ahead is also a key component.  Cooking - Educational psychologist conducted group or individual video education with verbal and written material and guidebook.  Patient learns eating strategies to improve overall health, including an approach to cook more at home. Recommendations include thinking of animal protein as a side on your plate rather than center stage and focusing instead on lower calorie dense options like vegetables, fruits, whole grains, and plant-based proteins, such as beans. Making sauces in large quantities to freeze for later and leaving the skin on your vegetables are also recommended to maximize your experience.  Cooking - Healthy Salads and Dressing Clinical staff conducted group or individual video education with verbal and written material and guidebook.  Patient learns that vegetables, fruits, whole grains, and legumes are the foundations of the Pritikin Eating Plan. Recommendations include how to incorporate each of these in flavorful and healthy salads, and how to create homemade salad dressings. Proper handling of ingredients is also covered. Cooking - Soups and [employer] - Soups and Desserts Clinical staff conducted group or individual video education with verbal and written material and guidebook.  Patient learns that Pritikin soups and desserts make for easy, nutritious, and delicious snacks and meal components that are low in sodium, fat, sugar, and calorie density, while high in vitamins, minerals, and filling fiber. Recommendations include simple and healthy ideas for soups and desserts.   Overview     The Pritikin Solution Program Overview Clinical staff conducted group or individual video education with verbal and written material and guidebook.  Patient learns that the results of the Pritikin Program have been documented in more than 100 articles published in peer-reviewed  journals, and the benefits include reducing risk factors for (and, in some cases, even reversing) high cholesterol, high blood pressure, type 2 diabetes, obesity, and more! An overview of the three key pillars of the Pritikin Program will be covered: eating well, doing regular exercise, and having a healthy mind-set.  WORKSHOPS  Exercise: Exercise Basics: Building Your Action Plan Clinical staff led group instruction and group discussion with PowerPoint presentation and patient guidebook. To enhance the learning environment the use of posters, models and videos may be added. At the conclusion of this workshop, patients will comprehend the difference between physical activity and exercise, as well as the benefits of incorporating both, into their routine. Patients will understand the FITT (Frequency, Intensity, Time, and Type) principle and how to use it to build an exercise action plan. In addition, safety concerns and other considerations for exercise and cardiac rehab will be addressed by the presenter. The purpose of this lesson is to promote a comprehensive and effective weekly exercise routine in order to improve patients' overall level of fitness.   Managing Heart Disease:  Your Path to a Healthier Heart Clinical staff led group instruction and group discussion with PowerPoint presentation and patient guidebook. To enhance the learning environment the use of posters, models and videos may be added.At the conclusion of this workshop, patients will understand the anatomy and physiology of the heart. Additionally, they will understand how Pritikin's three pillars impact the risk factors, the progression, and the management of heart disease.  The purpose of this lesson is to provide a high-level overview of the heart, heart disease, and how the Pritikin lifestyle positively impacts risk factors.  Exercise Biomechanics Clinical staff led group instruction and group discussion with PowerPoint  presentation and patient guidebook. To enhance the learning environment the use of posters, models and videos may be added. Patients will learn how the structural parts of their bodies function and how these functions impact their daily activities, movement, and exercise. Patients will learn how to promote a neutral spine, learn how to manage pain, and identify ways to improve their physical movement in order to promote healthy living. The purpose of this lesson is to expose patients to common physical limitations that impact physical activity. Participants will learn practical ways to adapt and manage aches and pains, and to minimize their effect on regular exercise. Patients will learn how to maintain good posture while sitting, walking, and lifting.  Balance Training and Fall Prevention  Clinical staff led group instruction and group discussion with PowerPoint presentation and patient guidebook. To enhance the learning environment the use of posters, models and videos may be added. At the conclusion of this workshop, patients will understand the importance of their sensorimotor skills (vision, proprioception, and the vestibular system) in maintaining their ability to balance as they age. Patients will apply a variety of balancing exercises that are appropriate for their current level of function. Patients will understand the common causes for poor balance, possible solutions to these problems, and ways to modify their physical environment in order to minimize their fall risk. The purpose of this lesson is to teach patients about the importance of maintaining balance as they age and ways to minimize their risk of falling.  WORKSHOPS   Nutrition:  Fueling a Ship broker led group instruction and group discussion with PowerPoint presentation and patient guidebook. To enhance the learning environment the use of posters, models and videos may be added. Patients will review the  foundational principles of the Pritikin Eating Plan and understand what constitutes a serving size in each of the food groups. Patients will also learn Pritikin-friendly foods that are better choices when away from home and review make-ahead meal and snack options. Calorie density will be reviewed and applied to three nutrition priorities: weight maintenance, weight loss, and weight gain. The purpose of this lesson is to reinforce (in a group setting) the key concepts around what patients are recommended to eat and how to apply these guidelines when away from home by planning and selecting Pritikin-friendly options. Patients will understand how calorie density may be adjusted for different weight management goals.  Mindful Eating  Clinical staff led group instruction and group discussion with PowerPoint presentation and patient guidebook. To enhance the learning environment the use of posters, models and videos may be added. Patients will briefly review the concepts of the Pritikin Eating Plan and the importance of low-calorie dense foods. The concept of mindful eating will be introduced as well as the importance of paying attention to internal hunger signals. Triggers for non-hunger eating and techniques for dealing  with triggers will be explored. The purpose of this lesson is to provide patients with the opportunity to review the basic principles of the Pritikin Eating Plan, discuss the value of eating mindfully and how to measure internal cues of hunger and fullness using the Hunger Scale. Patients will also discuss reasons for non-hunger eating and learn strategies to use for controlling emotional eating.  Targeting Your Nutrition Priorities Clinical staff led group instruction and group discussion with PowerPoint presentation and patient guidebook. To enhance the learning environment the use of posters, models and videos may be added. Patients will learn how to determine their genetic susceptibility to  disease by reviewing their family history. Patients will gain insight into the importance of diet as part of an overall healthy lifestyle in mitigating the impact of genetics and other environmental insults. The purpose of this lesson is to provide patients with the opportunity to assess their personal nutrition priorities by looking at their family history, their own health history and current risk factors. Patients will also be able to discuss ways of prioritizing and modifying the Pritikin Eating Plan for their highest risk areas  Menu  Clinical staff led group instruction and group discussion with PowerPoint presentation and patient guidebook. To enhance the learning environment the use of posters, models and videos may be added. Using menus brought in from [employer], or printed from [employer], patients will apply the Pritikin dining out guidelines that were presented in the [employer] video. Patients will also be able to practice these guidelines in a variety of provided scenarios. The purpose of this lesson is to provide patients with the opportunity to practice hands-on learning of the Pritikin Dining Out guidelines with actual menus and practice scenarios.  Label Reading Clinical staff led group instruction and group discussion with PowerPoint presentation and patient guidebook. To enhance the learning environment the use of posters, models and videos may be added. Patients will review and discuss the Pritikin label reading guidelines presented in Pritikin's Label Reading Educational series video. Using fool labels brought in from local grocery stores and markets, patients will apply the label reading guidelines and determine if the packaged food meet the Pritikin guidelines. The purpose of this lesson is to provide patients with the opportunity to review, discuss, and practice hands-on learning of the Pritikin Label Reading guidelines with actual packaged food  labels. Cooking School  Pritikin's [employer] are designed to teach patients ways to prepare quick, simple, and affordable recipes at home. The importance of nutrition's role in chronic disease risk reduction is reflected in its emphasis in the overall Pritikin program. By learning how to prepare essential core Pritikin Eating Plan recipes, patients will increase control over what they eat; be able to customize the flavor of foods without the use of added salt, sugar, or fat; and improve the quality of the food they consume. By learning a set of core recipes which are easily assembled, quickly prepared, and affordable, patients are more likely to prepare more healthy foods at home. These workshops focus on convenient breakfasts, simple entres, side dishes, and desserts which can be prepared with minimal effort and are consistent with nutrition recommendations for cardiovascular risk reduction. Cooking [employer] are taught by a Armed forces logistics/support/administrative officer (RD) who has been trained by the [employer]. The chef or RD has a clear understanding of the importance of minimizing - if not completely eliminating - added fat, sugar, and sodium in recipes. Throughout the  series of Cooking School Workshop sessions, patients will learn about healthy ingredients and efficient methods of cooking to build confidence in their capability to prepare    Cooking School weekly topics:  Adding Flavor- Sodium-Free  Fast and Healthy Breakfasts  Powerhouse Plant-Based Proteins  Satisfying Salads and Dressings  Simple Sides and Sauces  International Cuisine-Spotlight on the [employer] Zones  Delicious Desserts  Savory Soups  [employer] - Meals in a Astronomer Appetizers and Snacks  Comforting Weekend Breakfasts  One-Pot Wonders   Fast Evening Meals  Landscape architect Your Pritikin Plate  WORKSHOPS   Healthy Mindset (Psychosocial):  Focused Goals, Sustainable  Changes Clinical staff led group instruction and group discussion with PowerPoint presentation and patient guidebook. To enhance the learning environment the use of posters, models and videos may be added. Patients will be able to apply effective goal setting strategies to establish at least one personal goal, and then take consistent, meaningful action toward that goal. They will learn to identify common barriers to achieving personal goals and develop strategies to overcome them. Patients will also gain an understanding of how our mind-set can impact our ability to achieve goals and the importance of cultivating a positive and growth-oriented mind-set. The purpose of this lesson is to provide patients with a deeper understanding of how to set and achieve personal goals, as well as the tools and strategies needed to overcome common obstacles which may arise along the way.  From Head to Heart: The Power of a Healthy Outlook  Clinical staff led group instruction and group discussion with PowerPoint presentation and patient guidebook. To enhance the learning environment the use of posters, models and videos may be added. Patients will be able to recognize and describe the impact of emotions and mood on physical health. They will discover the importance of self-care and explore self-care practices which may work for them. Patients will also learn how to utilize the 4 C's to cultivate a healthier outlook and better manage stress and challenges. The purpose of this lesson is to demonstrate to patients how a healthy outlook is an essential part of maintaining good health, especially as they continue their cardiac rehab journey.  Healthy Sleep for a Healthy Heart Clinical staff led group instruction and group discussion with PowerPoint presentation and patient guidebook. To enhance the learning environment the use of posters, models and videos may be added. At the conclusion of this workshop, patients will be able  to demonstrate knowledge of the importance of sleep to overall health, well-being, and quality of life. They will understand the symptoms of, and treatments for, common sleep disorders. Patients will also be able to identify daytime and nighttime behaviors which impact sleep, and they will be able to apply these tools to help manage sleep-related challenges. The purpose of this lesson is to provide patients with a general overview of sleep and outline the importance of quality sleep. Patients will learn about a few of the most common sleep disorders. Patients will also be introduced to the concept of "sleep hygiene," and discover ways to self-manage certain sleeping problems through simple daily behavior changes. Finally, the workshop will motivate patients by clarifying the links between quality sleep and their goals of heart-healthy living.   Recognizing and Reducing Stress Clinical staff led group instruction and group discussion with PowerPoint presentation and patient guidebook. To enhance the learning environment the use of posters, models and videos may be added. At the conclusion of this workshop, patients will  be able to understand the types of stress reactions, differentiate between acute and chronic stress, and recognize the impact that chronic stress has on their health. They will also be able to apply different coping mechanisms, such as reframing negative self-talk. Patients will have the opportunity to practice a variety of stress management techniques, such as deep abdominal breathing, progressive muscle relaxation, and/or guided imagery.  The purpose of this lesson is to educate patients on the role of stress in their lives and to provide healthy techniques for coping with it.  Learning Barriers/Preferences:  Learning Barriers/Preferences - 09/18/23 1102       Learning Barriers/Preferences   Learning Barriers None    Learning Preferences Verbal Instruction;Written Material;Skilled  Demonstration          Education Topics:  Knowledge Questionnaire Score:  Knowledge Questionnaire Score - 09/18/23 1030       Knowledge Questionnaire Score   Pre Score 24/24          Core Components/Risk Factors/Patient Goals at Admission:  Personal Goals and Risk Factors at Admission - 09/18/23 0859       Core Components/Risk Factors/Patient Goals on Admission    Weight Management Yes;Weight Maintenance    Intervention Weight Management: Develop a combined nutrition and exercise program designed to reach desired caloric intake, while maintaining appropriate intake of nutrient and fiber, sodium and fats, and appropriate energy expenditure required for the weight goal.;Weight Management: Provide education and appropriate resources to help participant work on and attain dietary goals.    Admit Weight 129 lb 13.6 oz (58.9 kg)    Expected Outcomes Weight Maintenance: Understanding of the daily nutrition guidelines, which includes 25-35% calories from fat, 7% or less cal from saturated fats, less than 200mg  cholesterol, less than 1.5gm of sodium, & 5 or more servings of fruits and vegetables daily;Understanding recommendations for meals to include 15-35% energy as protein, 25-35% energy from fat, 35-60% energy from carbohydrates, less than 200mg  of dietary cholesterol, 20-35 gm of total fiber daily;Understanding of distribution of calorie intake throughout the day with the consumption of 4-5 meals/snacks    Heart Failure Yes    Intervention Provide a combined exercise and nutrition program that is supplemented with education, support and counseling about heart failure. Directed toward relieving symptoms such as shortness of breath, decreased exercise tolerance, and extremity edema.    Expected Outcomes Short term: Attendance in program 2-3 days a week with increased exercise capacity. Reported lower sodium intake. Reported increased fruit and vegetable intake. Reports medication  compliance.;Short term: Daily weights obtained and reported for increase. Utilizing diuretic protocols set by physician.;Long term: Adoption of self-care skills and reduction of barriers for early signs and symptoms recognition and intervention leading to self-care maintenance.;Improve functional capacity of life    Hypertension Yes    Intervention Provide education on lifestyle modifcations including regular physical activity/exercise, weight management, moderate sodium restriction and increased consumption of fresh fruit, vegetables, and low fat dairy, alcohol moderation, and smoking cessation.;Monitor prescription use compliance.    Expected Outcomes Short Term: Continued assessment and intervention until BP is < 140/81mm HG in hypertensive participants. < 130/41mm HG in hypertensive participants with diabetes, heart failure or chronic kidney disease.;Long Term: Maintenance of blood pressure at goal levels.    Lipids Yes    Intervention Provide education and support for participant on nutrition & aerobic/resistive exercise along with prescribed medications to achieve LDL 70mg , HDL >40mg .    Expected Outcomes Short Term: Participant states understanding of desired cholesterol values  and is compliant with medications prescribed. Participant is following exercise prescription and nutrition guidelines.;Long Term: Cholesterol controlled with medications as prescribed, with individualized exercise RX and with personalized nutrition plan. Value goals: LDL < 70mg , HDL > 40 mg.    Stress Yes    Intervention Offer individual and/or small group education and counseling on adjustment to heart disease, stress management and health-related lifestyle change. Teach and support self-help strategies.;Refer participants experiencing significant psychosocial distress to appropriate mental health specialists for further evaluation and treatment. When possible, include family members and significant others in  education/counseling sessions.    Expected Outcomes Short Term: Participant demonstrates changes in health-related behavior, relaxation and other stress management skills, ability to obtain effective social support, and compliance with psychotropic medications if prescribed.;Long Term: Emotional wellbeing is indicated by absence of clinically significant psychosocial distress or social isolation.          Core Components/Risk Factors/Patient Goals Review:   Goals and Risk Factor Review     Row Name 09/18/23 0856             Core Components/Risk Factors/Patient Goals Review   Personal Goals Review --          Core Components/Risk Factors/Patient Goals at Discharge (Final Review):   Goals and Risk Factor Review - 09/18/23 0856       Core Components/Risk Factors/Patient Goals Review   Personal Goals Review --          ITP Comments:  ITP Comments     Row Name 09/18/23 0757           ITP Comments Wilbert Bihari, MD: Medical Director.  Introduction to the [employer] / Intensive Cardiac Rehab.  Intial orientation packet reviewed with the patient.          Comments: Pt introduced to [employer] and [employer] through Insurance underwriter and video. Pt completed her w/o unusual signs or symptoms. Pt maintained SR with infrequent PACs. Pt completed her measurements and voiced her exercise goals. Pt denies questions about program or test.  Garen FORBES Candy MS, ACSM-CEP 09/18/2023 11:32 AM  [phone]

## 2023-09-18 NOTE — Telephone Encounter (Signed)
   Karen Cameron  Please let Karen Cameron Karen Cameron Karen Cameron  know that Dr Zenaida approved gabapentin and I want to go with low dose  Plan  Take gabapentin 100mg  once daily at night  x 7 days, then 200mg  once daily at night x 7 days and then 300mg  once daily at night to continue.   - If this makes you too sleepy or drowsy call us  and we will cut your medication dosing down  - iof cough not improvd, then I have to escalate dose   Karen Cameron  Thanks a lot  MR

## 2023-09-18 NOTE — Progress Notes (Signed)
 Cardiac Rehab Medication Review by a Nurse   Does the patient  feel that his/her medications are working for him/her?  yes   Has the patient been experiencing any side effects to the medications prescribed?  yes.  Suspects medications may be exacerbating cough that has been lingering for approximately 15 years.   Does the patient measure his/her own blood pressure or blood glucose at home?  yes    Does the patient have any problems obtaining medications due to transportation or finances?   no   Understanding of regimen: excellent Understanding of indications: excellent Potential of compliance: excellent   Nurse comments: Pt states, I do not have any questions related to my medications at this time.

## 2023-09-19 ENCOUNTER — Ambulatory Visit: Admitting: Physical Therapy

## 2023-09-19 ENCOUNTER — Encounter: Payer: Self-pay | Admitting: Physical Therapy

## 2023-09-19 DIAGNOSIS — M6281 Muscle weakness (generalized): Secondary | ICD-10-CM

## 2023-09-19 DIAGNOSIS — I219 Acute myocardial infarction, unspecified: Secondary | ICD-10-CM

## 2023-09-19 DIAGNOSIS — F4323 Adjustment disorder with mixed anxiety and depressed mood: Secondary | ICD-10-CM | POA: Insufficient documentation

## 2023-09-19 NOTE — Assessment & Plan Note (Signed)
 Depression screening is 6 related to her current situation. She has good family support. She is having intermittent episodes of anxiety due to recent diagnosis of MI

## 2023-09-19 NOTE — Assessment & Plan Note (Signed)
 ST-elevation myocardial infarction (STEMI), anterior wall, complicated by heart failure with reduced ejection fraction (HFrEF) with recent hospitalization Recent anterior wall STEMI with HFrEF, ejection fraction at 25%. On multiple cardiac medications. Using LifeVest, referred to cardiac rehab. Restricted from driving, using rollator for mobility. - Continue current cardiac medications. - Use nitroglycerin  as needed for angina. - Attend cardiac rehab. - Follow up with cardiologist on Tuesday. - Use rollator for mobility. - Avoid driving until further notice per Cardiology instructions.  - TCM Performed. A member of the clinical team spoke with the patient upon dischare. Discharge summary was reviewed in full detail during the visit. Meds reconciled and compared to discharge meds. Medication list is updated and reviewed with the patient.  Greater than 50% face to face time was spent in counseling an coordination of care.  All questions were answered to the satisfaction of the patient.

## 2023-09-19 NOTE — Assessment & Plan Note (Signed)
 On atorvastatin  as part of cardiac regimen to manage elevated LDL. - Continue atorvastatin  as prescribed.

## 2023-09-19 NOTE — Telephone Encounter (Signed)
 I called and Spoke to pt. Pt informed. NFN

## 2023-09-19 NOTE — Therapy (Signed)
 OUTPATIENT PHYSICAL THERAPY LOWER EXTREMITY Daily Note  ARRIVED -NO CHARGE   Patient Name: Karen Cameron MRN: [id number] DOB:[birth year], 58 y.o., female Today's Date: 09/19/2023  09/19/23: Patient was seen for Cardiac Rehabilitation intake yesterday. It will be more appropriate for her to attend Cardiac Rehab at this time. She will be discharged from outpatient physical therapy today and continue with her cardiac appointments.     Past Medical History:  Diagnosis Date   Acute ST elevation myocardial infarction (STEMI) of anterolateral wall (HCC) 08/19/2023   Allergy     Anxiety    Depression    Encounter for annual health examination 05/21/2023   Hypertension    Nausea & vomiting    SAB (spontaneous abortion)    X2   SUI (stress urinary incontinence, female)    Past Surgical History:  Procedure Laterality Date   CHOLECYSTECTOMY     CORONARY THROMBECTOMY N/A 08/19/2023   Procedure: Coronary Thrombectomy;  Surgeon: Ladona Heinz, MD;  Location: [facility] INVASIVE CV LAB;  Service: Cardiovascular;  Laterality: N/A;   CORONARY/GRAFT ACUTE MI REVASCULARIZATION N/A 08/19/2023   Procedure: Coronary/Graft Acute MI Revascularization;  Surgeon: Ladona Heinz, MD;  Location: [facility] INVASIVE CV LAB;  Service: Cardiovascular;  Laterality: N/A;   DILATION AND CURETTAGE OF UTERUS     IABP INSERTION N/A 08/19/2023   Procedure: IABP Insertion;  Surgeon: Ladona Heinz, MD;  Location: [facility] INVASIVE CV LAB;  Service: Cardiovascular;  Laterality: N/A;   RIGHT HEART CATH N/A 08/19/2023   Procedure: RIGHT HEART CATH;  Surgeon: Zenaida Morene JINNY, MD;  Location: [facility] INVASIVE CV LAB;  Service: Cardiovascular;  Laterality: N/A;   TUBAL LIGATION     Patient Active Problem List   Diagnosis Date Noted   Acute ST elevation myocardial infarction (STEMI) of anterolateral wall (HCC) 08/19/2023   Acute ST elevation myocardial infarction (STEMI) of anterior wall (HCC) 08/19/2023   Encounter for annual health examination 05/21/2023    Ganglion cyst of wrist, right 09/18/2022   COVID-19 vaccination declined 09/18/2022   Essential hypertension 11/08/2021   Elevated LDL cholesterol level 11/08/2021   Other allergic rhinitis 05/09/2018   Chronic cough 05/08/2018   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 Dr. Zenaida referred her to both cardiac rehab and to outpatient PT. Patient reports she was at a cookout and started having chest pains and was throwing up. Patient thought she was having heart burn or food poisoning. Went to ED next day and was found to be having an MI. Patient reports that she is able to complete simple house chores but is unable to return to recreational activities such as pickle ball, yoga, and line dancing. Patient reports that walking more than 15-20 minutes she will get very fatigued.   PERTINENT HISTORY: Chronic hypertension, right heart cath, is wearing life vest external defibrillator  PAIN:  Are you having pain? No  PRECAUTIONS: Other: Universal  RED FLAGS: None   WEIGHT BEARING RESTRICTIONS: No  FALLS:  Has patient fallen in last 6 months? No  LIVING ENVIRONMENT: Lives with: lives with their family Lives in: Other town Stairs: Yes: Internal: more than 10 steps; on left going up  Has following equipment at home: Vannie - 4 wheeled  OCCUPATION: Currently unemployed; case Production designer, theatre/television/film at [employer]  PLOF: Independent  PATIENT GOALS: go back to pickle ball, line dancing, and work  NEXT MD VISIT: Kurt of date  OBJECTIVE:  Note: Objective measures were completed at Evaluation unless otherwise  noted.  DIAGNOSTIC FINDINGS: Patient presents with decreased endurance/activity tolerance due to MI   COGNITION: Overall cognitive status: Within functional limits for tasks assessed     SENSATION: WFL    POSTURE: rounded shoulders  PALPATION: No TTP per patient complaint  LOWER EXTREMITY ROM:  All LE ROM WNL  (Blank rows = not tested)  LOWER EXTREMITY MMT:  MMT Right eval Left eval  Hip flexion 4 4  Hip extension    Hip abduction 4- 4  Hip adduction    Hip internal rotation    Hip external rotation    Knee flexion 4 4  Knee extension 4 4  Ankle dorsiflexion    Ankle plantarflexion    Ankle inversion    Ankle eversion     (Blank rows = not tested)   FUNCTIONAL TESTS:  30 seconds chair stand test 6 minute walk test: 1072 feet  30 second chair stand 18 reps but some half reps not standing with full knee extension  GAIT: Distance walked: 1072 feet Assistive device utilized: None Level of assistance: Complete Independence Comments: Walks with decreased right LE swing and increased lateral sway                                                                                                                                TREATMENT DATE: 09/15/2023     PATIENT EDUCATION:  Education details: Discussed objective findings with patient. Discussed POC including frequency, HEP, and anatomy/physiology of present condition Person educated: Patient Education method: Explanation, Demonstration, and Handouts Education comprehension: verbalized understanding, returned demonstration, and needs further education  HOME EXERCISE PROGRAM: Access Code: [email or web address] URL: [email or web address] Date: 09/15/2023 Prepared by: Alfonse Cords Diy  Exercises - Runner's Step up with Arms Forward  - 2 x daily - 7 x weekly - 3 sets - 10 reps - Resisted Sit-to-Stand With Dumbbell Held in Shoulder Flexion  - 2 x daily - 7 x weekly - 3 sets - 10 reps - Side Stepping with  Resistance at Ankles  - 2 x daily - 7 x weekly - 3 sets - 10 reps  ASSESSMENT:  CLINICAL IMPRESSION: EVAL: Patient is a 58 y.o. female who was seen today for physical therapy evaluation and treatment for s/p acute MI and heart failure. Patient presents to therapy without complaints of pain. However, patient presents with altered gait pattern and significant decrease in endurance and functional mobility. Patient is unable to navigate stairs or perform recreational activities due to decreased activity tolerance. Patient requires skilled PT services to improve endurance and return to prior level of function.   OBJECTIVE IMPAIRMENTS: decreased endurance, difficulty walking, and  decreased strength.   ACTIVITY LIMITATIONS: carrying, squatting, and recreational activities  PARTICIPATION LIMITATIONS: occupation  PERSONAL FACTORS: 1-2 comorbidities: acute MI and history of heart failure are also affecting patient's functional outcome.   REHAB POTENTIAL: Good  CLINICAL DECISION MAKING: Stable/uncomplicated  EVALUATION COMPLEXITY: Low   GOALS: Goals reviewed with patient? No  SHORT TERM GOALS: Target date: 10/13/2023   Patient will be independent with HEP to improve carryover of sessions Baseline: See above Goal status: DEFERRED   2.  Patient will be able to ascend and descend stairs in reciprocal pattern without restrictions to be able to access home Baseline: Unable to navigate stairs to access second floor of home Goal status: DEFERRED   3.  Patient will be able to walk greater than 20 minutes without rest break to perform IADLs such as grocery shopping Baseline: Unable to go grocery shopping greater than 15 minutes Goal status: DEFERRED    LONG TERM GOALS: Target date: 11/10/2023   Patient will be able to walk greater than 30 minutes without need for rest break to return to work Baseline: Unable to walk more than 15 minutes and is required to walk throughout work day Goal  status: DEFERRED   2.  Patient will be able to run and play pickle ball at least 1 hour to return to recreational activities  Baseline: Unable to participate Goal status: DEFERRED    PLAN:  PT FREQUENCY: 2x/week  PT DURATION: 8 weeks  PLANNED INTERVENTIONS: 97164- PT Re-evaluation, 97750- Physical Performance Testing, 97110-Therapeutic exercises, 97530- Therapeutic activity, 97112- Neuromuscular re-education, 97535- Self Care, 02859- Manual therapy, [phone]- Gait training, [phone]- Aquatic Therapy, Patient/Family education, Balance training, Stair training, Taping, Joint mobilization, Joint manipulation, Cryotherapy, Moist heat, and Biofeedback    Harlene Persons, PTA 09/19/23 10:35 AM Phone: [phone] Fax: [phone]

## 2023-09-19 NOTE — Assessment & Plan Note (Signed)
 Blood pressure is controlled

## 2023-09-24 ENCOUNTER — Encounter (HOSPITAL_COMMUNITY)
Admission: RE | Admit: 2023-09-24 | Discharge: 2023-09-24 | Disposition: A | Discharge status: Home / Self Care | Source: Ambulatory Visit | Attending: Cardiology | Admitting: Cardiology

## 2023-09-24 ENCOUNTER — Other Ambulatory Visit (HOSPITAL_COMMUNITY): Payer: Self-pay

## 2023-09-24 DIAGNOSIS — Z955 Presence of coronary angioplasty implant and graft: Secondary | ICD-10-CM

## 2023-09-24 DIAGNOSIS — I213 ST elevation (STEMI) myocardial infarction of unspecified site: Secondary | ICD-10-CM | POA: Diagnosis not present

## 2023-09-24 DIAGNOSIS — I2102 ST elevation (STEMI) myocardial infarction involving left anterior descending coronary artery: Secondary | ICD-10-CM

## 2023-09-24 MED ORDER — GABAPENTIN 100 MG PO CAPS
ORAL_CAPSULE | ORAL | 0 refills | Status: DC
  Filled 2023-09-24: qty 42, 21d supply, fill #0

## 2023-09-24 NOTE — Telephone Encounter (Signed)
 Copied from CRM [id number]. Topic: Clinical - Prescription Issue >> Sep 24, 2023 12:14 PM Karen Cameron wrote: Reason for CRM: Patient is calling regarding her gabapentin  medication.Patient stated it was supposed to be sent to [facility] and they stated they do not have the medication. Patient would like a callback a regarding this.  Rx sent to preferred pharmacy & pt is aware. Nfn

## 2023-09-24 NOTE — Progress Notes (Signed)
 Daily Session Note  Patient Details  Name: Karen Cameron MRN: [id number] Date of Birth: 05/14/1965 Referring Provider:   Flowsheet Row INTENSIVE CARDIAC REHAB ORIENT from 09/18/2023 in [facility]  Referring Provider Morene Brownie, MD    Encounter Date: 09/24/2023  Check In:  Session Check In - 09/24/23 1332       Check-In   Supervising physician immediately available to respond to emergencies [facility] MD immediately available    Physician(s) Damien Braver, NP    Location [facility]    Staff Present Garen Candy, MS, Exercise Physiologist;Jetta Vannie BS, ACSM-CEP, Exercise Physiologist;Olinty Valere, MS, ACSM-CEP, Exercise Physiologist;David Makemson, MS, ACSM-CEP, CCRP, Exercise Physiologist;Maudell Stanbrough, RN, BSN    Virtual Visit No    Medication changes reported     No    Fall or balance concerns reported    No    Tobacco Cessation No Change    Warm-up and Cool-down Performed as group-led instruction    Resistance Training Performed Yes    VAD Patient? No    PAD/SET Patient? No      Pain Assessment   Currently in Pain? No/denies    Pain Score 0-No pain    Multiple Pain Sites No          Capillary Blood Glucose: No results found for this or any previous visit (from the past 24 hours).   Exercise Prescription Changes - 09/24/23 1400       Response to Exercise   Blood Pressure (Admit) 100/70    Blood Pressure (Exercise) 110/62    Blood Pressure (Exit) 92/66    Heart Rate (Admit) 85 bpm    Heart Rate (Exercise) 112 bpm    Heart Rate (Exit) 1.39 bpm    Rating of Perceived Exertion (Exercise) 12    Symptoms None    Comments Pt's first day in the CRP2 program    Duration Continue with 30 min of aerobic exercise without signs/symptoms of physical distress.    Intensity THRR unchanged      Progression   Progression Continue to progress workloads to maintain intensity without  signs/symptoms of physical distress.    Average METs 3.47      Resistance Training   Training Prescription Yes    Weight 3 lbs    Reps 10-15    Time 5 Minutes      Interval Training   Interval Training No      Treadmill   MPH 2    Grade 0.5    Minutes 15    METs 2.67      Bike   Level 1    Watts 42.5    Minutes 15    METs 4.27          Social History   Tobacco Use  Smoking Status Never  Smokeless Tobacco Never    Goals Met:  Exercise tolerated well No report of concerns or symptoms today Strength training completed today  Goals Unmet:  Not Applicable  Comments: Pt started cardiac rehab today.  Pt tolerated light exercise without difficulty. VSS, telemetry-Sinus Rhythm t wave inversion, asymptomatic.  Medication list reconciled. Pt denies barriers to medicaiton compliance.  PSYCHOSOCIAL ASSESSMENT:  PHQ-6. Pt exhibits positive coping skills, hopeful outlook with supportive family. Will discuss PHQ 9 in the near future.  Pt oriented to exercise equipment and routine. [name] enjoys pickelball, yoga reading and sewing.    Understanding verbalized. Hadassah Elpidio Quan RN  BSN    Dr. Wilbert Bihari is Medical Director for Cardiac Rehab at [facility].

## 2023-09-25 DIAGNOSIS — I213 ST elevation (STEMI) myocardial infarction of unspecified site: Secondary | ICD-10-CM | POA: Diagnosis not present

## 2023-09-26 ENCOUNTER — Encounter (HOSPITAL_COMMUNITY)
Admission: RE | Admit: 2023-09-26 | Discharge: 2023-09-26 | Disposition: A | Discharge status: Home / Self Care | Source: Ambulatory Visit | Attending: Cardiology

## 2023-09-26 DIAGNOSIS — I213 ST elevation (STEMI) myocardial infarction of unspecified site: Secondary | ICD-10-CM | POA: Diagnosis not present

## 2023-09-26 DIAGNOSIS — Z955 Presence of coronary angioplasty implant and graft: Secondary | ICD-10-CM

## 2023-09-26 DIAGNOSIS — I2102 ST elevation (STEMI) myocardial infarction involving left anterior descending coronary artery: Secondary | ICD-10-CM

## 2023-09-27 ENCOUNTER — Ambulatory Visit

## 2023-09-28 ENCOUNTER — Encounter (HOSPITAL_COMMUNITY)
Admission: RE | Admit: 2023-09-28 | Discharge: 2023-09-28 | Disposition: A | Discharge status: Home / Self Care | Source: Ambulatory Visit | Attending: Cardiology | Admitting: Cardiology

## 2023-09-28 DIAGNOSIS — I2102 ST elevation (STEMI) myocardial infarction involving left anterior descending coronary artery: Secondary | ICD-10-CM

## 2023-09-28 DIAGNOSIS — I213 ST elevation (STEMI) myocardial infarction of unspecified site: Secondary | ICD-10-CM | POA: Diagnosis not present

## 2023-09-28 DIAGNOSIS — Z955 Presence of coronary angioplasty implant and graft: Secondary | ICD-10-CM

## 2023-09-28 NOTE — Progress Notes (Signed)
 Spoke with pt regarding her pyschosocial PHQ9 done while in CR orientation (09/18/23) to assess if her PHQ9 score has improved, which it has to a current score of 4.  Will continue to monitor.

## 2023-09-28 NOTE — Progress Notes (Incomplete)
 ADVANCED HF CLINIC CONSULT NOTE Primary Care: Georgina Speaks, FNP Primary Cardiologist: Dr. Ladona HF Cardiologist: Dr. Zenaida  HPI: Karen Cameron is a 58 y.o. AAF with history of HTN and new diagnosis of CAD and systolic heart failure.  Admitted 8/25 with STEMI. EKG showed anterolateral STEMI with active chest discomfort. Taken urgently to cath lab where cath showed acutely occluded LAD with significant thrombotic lesion. Patient underwent thrombectomy, and PTCA of LAD. Dr Zenaida completed RHC which showed low filling pressures and low CI, IABP placed. Required DBA gtt. Echo showed EF 25-30%, normal RV.  Drips and IABP eventually weaned and GDMT titrated. LifeVest placed at discharge due to runs of VT. She was discharged home, weight 130 lbs.  Today she returns for post hospital HF follow up with her mother and husband. Overall feeling fine. She has fatigue when walking up steps, no dyspnea with activity. Enjoys yoga. Denies palpitations, abnormal bleeding, CP, dizziness, edema, or PND/Orthopnea. Appetite ok. Weight at home 127-129 pounds. Taking all medications. 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 Wearing LifeVest.  Cardiac Studies - Echo (8/25): EF 25-30%, mild LVH, RV normal  - RHC w/ IABP (8/25): RA 2, PA 33/14 (20), PCWP 12, CO/CI (Fick) 3.03/1.9, PVR 2.8 WU, PAPi 9.5   - LHC (8/25): PTCA stenting of mid LAD; IABP placed  Past Medical History:  Diagnosis Date   Acute ST elevation myocardial infarction (STEMI) of anterolateral wall (HCC) 08/19/2023   Allergy     Anxiety    Depression    Encounter for annual health examination 05/21/2023   Hypertension    Nausea & vomiting    SAB (spontaneous abortion)    X2   SUI (stress urinary incontinence, female)    Current Outpatient Medications  Medication Sig Dispense Refill   aspirin  81 MG chewable tablet Chew 1 tablet (81 mg total) by mouth daily. 30 tablet 5   atorvastatin  (LIPITOR ) 80 MG tablet Take 1 tablet (80 mg total) by  mouth daily. 30 tablet 5   empagliflozin  (JARDIANCE ) 10 MG TABS tablet Take 1 tablet (10 mg total) by mouth daily. 30 tablet 5   furosemide  (LASIX ) 20 MG tablet Take Lasix  20 mg (1 tablet) as needed for weight gain > 3 lbs overnight or > 5 lbs in one week, increased shortness of breath, or increased swelling. 30 tablet 3   gabapentin  (NEURONTIN ) 100 MG capsule Take 1 capsule (100 mg total) by mouth at bedtime for 7 days, THEN 2 capsules (200 mg total) at bedtime for 7 days, THEN 3 capsules (300 mg total) at bedtime for 7 days. 42 capsule 0   ivabradine  (CORLANOR ) 5 MG TABS tablet Take 1 tablet (5 mg total) by mouth 2 (two) times daily with a meal. 60 tablet 5   metoprolol  succinate (TOPROL -XL) 25 MG 24 hr tablet Take 1 tablet (25 mg total) by mouth daily. 30 tablet 5   nitroGLYCERIN  (NITROSTAT ) 0.4 MG SL tablet Place 1 tablet (0.4 mg total) under the tongue every 5 (five) minutes as needed for chest pain. 100 tablet 3   sacubitril -valsartan  (ENTRESTO ) 24-26 MG Take 1 tablet by mouth 2 (two) times daily. 60 tablet 5   spironolactone  (ALDACTONE ) 25 MG tablet Take 1 tablet (25 mg total) by mouth daily. 30 tablet 5   ticagrelor  (BRILINTA ) 90 MG TABS tablet Take 1 tablet (90 mg total) by mouth 2 (two) times daily. 60 tablet 5   No current facility-administered medications for this visit.   No Known  Allergies  Social History   Socioeconomic History   Marital status: Married    Spouse name: Not on file   Number of children: Not on file   Years of education: Not on file   Highest education level: Not on file  Occupational History   Not on file  Tobacco Use   Smoking status: Never   Smokeless tobacco: Never  Vaping Use   Vaping status: Never Used  Substance and Sexual Activity   Alcohol use: Yes    Comment: Socially   Drug use: No   Sexual activity: Yes    Birth control/protection: Surgical    Comment: BTL  Other Topics Concern   Not on file  Social History Narrative   Not on file    Social Drivers of Health   Financial Resource Strain: Not on file  Food Insecurity: No Food Insecurity (08/20/2023)   Hunger Vital Sign    Worried About Running Out of Food in the Last Year: Never true    Ran Out of Food in the Last Year: Never true  Transportation Needs: No Transportation Needs (08/20/2023)   PRAPARE - Administrator, Civil Service (Medical): No    Lack of Transportation (Non-Medical): No  Physical Activity: Not on file  Stress: Not on file  Social Connections: Not on file  Intimate Partner Violence: Not At Risk (08/20/2023)   Humiliation, Afraid, Rape, and Kick questionnaire    Fear of Current or Ex-Partner: No    Emotionally Abused: No    Physically Abused: No    Sexually Abused: No   Family History  Problem Relation Age of Onset   Diabetes Mother    Cancer Father        HEAD,NECK, THROAT   Diabetes Maternal Aunt    Diabetes Maternal Uncle    Diabetes Paternal Grandmother    Allergic rhinitis Neg Hx    Asthma Neg Hx    Eczema Neg Hx    Urticaria Neg Hx    Wt Readings from Last 3 Encounters:  09/18/23 58.9 kg (129 lb 13.6 oz)  09/13/23 59.3 kg (130 lb 12.8 oz)  09/04/23 57.2 kg (126 lb)   LMP  (LMP Unknown)   PHYSICAL EXAM: General:  NAD. No resp difficulty, walked into clinic HEENT: Normal Neck: Supple. No JVD. Cor: Regular rate & rhythm. No rubs, gallops or murmurs. Lungs: Clear Abdomen: Soft, nontender, nondistended.  Extremities: No cyanosis, clubbing, rash, edema Neuro: Alert & oriented x 3, moves all 4 extremities w/o difficulty. Affect pleasant.  ECG (personally reviewed): low voltage, NSR 78 bpm, anterior TWI  LifeVest interrogation (personally reviewed): no events, average HR 91 bpm, 2229 average daily steps  ASSESSMENT & PLAN: 1. Chronic Systolic Heart Failure: GCS due to late presenting anterior MI.  Severely reduced EF on LV gram. Echo 8/25: EF 25-30%, RV ok Apex AK, no LV thrombus. Required IABP and DBA gtt. - NYHA  II, fatigue main symptom. Volume OK, does not need daily loops - Will give Lasix  20 mg for PRN use - Off dig with elevated level. - Continue Jardiance  10 mg daily - Continue Entresto  24-26 mg bid - Continue spiro 25 mg daily  - Continue Corlanor  5 mg bid  - Continue Toprol  XL 25 mg daily  - Referred to CR - Repeat echo in 3 months. Continue LifeVest until repeat echo - Labs today   2. CAD: Prior history of HTN, minimal other risk factors. Cath w/ 1V occlusive disease, 100%, pLAD.  S/p successful PCI, complicated by shock as above. - No further chest pain - Continue aspirin  + ticagrelor  - Continue atorvastatin  80 mg daily  - Continue LifeVest due to short runs of NSVT and occasional PVCs,. - Outpatient CR   3. HLD: LDL 124 - Continue atorvastatin  80 mg daily - LDL Goal < 55   4. HTN  - BP well controlled - continue HF GDMT as above   Follow up in 3 weeks with APP and 3 months with Dr. Zenaida + echo  [place], FNP-BC 09/28/23

## 2023-09-29 ENCOUNTER — Encounter

## 2023-10-01 ENCOUNTER — Telehealth (HOSPITAL_COMMUNITY): Payer: Self-pay

## 2023-10-01 ENCOUNTER — Encounter (HOSPITAL_COMMUNITY)
Admission: RE | Admit: 2023-10-01 | Discharge: 2023-10-01 | Disposition: A | Discharge status: Home / Self Care | Source: Ambulatory Visit | Attending: Cardiology | Admitting: Cardiology

## 2023-10-01 DIAGNOSIS — I2102 ST elevation (STEMI) myocardial infarction involving left anterior descending coronary artery: Secondary | ICD-10-CM

## 2023-10-01 DIAGNOSIS — Z955 Presence of coronary angioplasty implant and graft: Secondary | ICD-10-CM

## 2023-10-01 DIAGNOSIS — I213 ST elevation (STEMI) myocardial infarction of unspecified site: Secondary | ICD-10-CM | POA: Diagnosis not present

## 2023-10-01 NOTE — Telephone Encounter (Signed)
 Called to confirm/remind patient of their appointment at the [facility] on 10/02/23.   Appointment:   [] Confirmed  [x] Left mess   [] No answer/No voice mail  [] VM Full/unable to leave message  [] Phone not in service  And to bring in all medications and/or complete list.

## 2023-10-02 ENCOUNTER — Ambulatory Visit (HOSPITAL_COMMUNITY)
Admission: RE | Admit: 2023-10-02 | Discharge: 2023-10-02 | Disposition: A | Discharge status: Home / Self Care | Source: Ambulatory Visit | Attending: Family Medicine | Admitting: Family Medicine

## 2023-10-02 ENCOUNTER — Ambulatory Visit: Admitting: Physical Therapy

## 2023-10-02 ENCOUNTER — Encounter (HOSPITAL_COMMUNITY): Payer: Self-pay

## 2023-10-02 VITALS — BP 94/62 | HR 77 | Ht 63.0 in | Wt 127.8 lb

## 2023-10-02 DIAGNOSIS — I472 Ventricular tachycardia, unspecified: Secondary | ICD-10-CM | POA: Diagnosis not present

## 2023-10-02 DIAGNOSIS — I10 Essential (primary) hypertension: Secondary | ICD-10-CM | POA: Diagnosis not present

## 2023-10-02 DIAGNOSIS — I2510 Atherosclerotic heart disease of native coronary artery without angina pectoris: Secondary | ICD-10-CM | POA: Insufficient documentation

## 2023-10-02 DIAGNOSIS — E7849 Other hyperlipidemia: Secondary | ICD-10-CM | POA: Diagnosis not present

## 2023-10-02 DIAGNOSIS — I5022 Chronic systolic (congestive) heart failure: Secondary | ICD-10-CM | POA: Insufficient documentation

## 2023-10-02 DIAGNOSIS — I493 Ventricular premature depolarization: Secondary | ICD-10-CM | POA: Insufficient documentation

## 2023-10-02 DIAGNOSIS — Z955 Presence of coronary angioplasty implant and graft: Secondary | ICD-10-CM | POA: Insufficient documentation

## 2023-10-02 DIAGNOSIS — E785 Hyperlipidemia, unspecified: Secondary | ICD-10-CM | POA: Insufficient documentation

## 2023-10-02 DIAGNOSIS — Z7902 Long term (current) use of antithrombotics/antiplatelets: Secondary | ICD-10-CM | POA: Insufficient documentation

## 2023-10-02 DIAGNOSIS — Z4509 Encounter for adjustment and management of other cardiac device: Secondary | ICD-10-CM | POA: Diagnosis not present

## 2023-10-02 DIAGNOSIS — Z7982 Long term (current) use of aspirin: Secondary | ICD-10-CM | POA: Diagnosis not present

## 2023-10-02 DIAGNOSIS — I110 Hypertensive heart disease with heart failure: Secondary | ICD-10-CM | POA: Insufficient documentation

## 2023-10-02 DIAGNOSIS — Z79899 Other long term (current) drug therapy: Secondary | ICD-10-CM | POA: Diagnosis not present

## 2023-10-02 DIAGNOSIS — I5020 Unspecified systolic (congestive) heart failure: Secondary | ICD-10-CM | POA: Diagnosis present

## 2023-10-02 DIAGNOSIS — I252 Old myocardial infarction: Secondary | ICD-10-CM | POA: Insufficient documentation

## 2023-10-02 DIAGNOSIS — Z86718 Personal history of other venous thrombosis and embolism: Secondary | ICD-10-CM | POA: Diagnosis not present

## 2023-10-02 NOTE — Patient Instructions (Addendum)
 Good to see you today!    Your physician has requested that you have an echocardiogram. Echocardiography is a painless test that uses sound waves to create images of your heart. It provides your doctor with information about the size and shape of your heart and how well your heart's chambers and valves are working. This procedure takes approximately one hour. There are no restrictions for this procedure. Please do NOT wear cologne, perfume, aftershave, or lotions (deodorant is allowed). Please arrive 15 minutes prior to your appointment time.  Please note: We ask at that you not bring children with you during ultrasound (echo/ vascular) testing. Due to room size and safety concerns, children are not allowed in the ultrasound rooms during exams. Our front office staff cannot provide observation of children in our lobby area while testing is being conducted. An adult accompanying a patient to their appointment will only be allowed in the ultrasound room at the discretion of the ultrasound technician under special circumstances. We apologize for any inconvenience.   Your physician recommends that you schedule a follow-up appointment as scheduled with echo  If you have any questions or concerns before your next appointment please send us  a message through [place] or call our office at [phone].    TO LEAVE A MESSAGE FOR THE NURSE SELECT OPTION 2, PLEASE LEAVE A MESSAGE INCLUDING: YOUR NAME DATE OF BIRTH CALL BACK NUMBER REASON FOR CALL**this is important as we prioritize the call backs  YOU WILL RECEIVE A CALL BACK THE SAME DAY AS LONG AS YOU CALL BEFORE 4:00 PM At the [facility], you and your health needs are our priority. As part of our continuing mission to provide you with exceptional heart care, we have created designated Provider Care Teams. These Care Teams include your primary Cardiologist (physician) and Advanced Practice Providers (APPs- Physician Assistants and Nurse  Practitioners) who all work together to provide you with the care you need, when you need it.   You may see any of the following providers on your designated Care Team at your next follow up: Dr Toribio Fuel Dr Ezra Shuck Dr. Ria Commander Dr. Morene Brownie Amy Lenetta, NP Caffie Shed, [place] [employer] [facility] [place], [place] Beckey Coe, NP [place] Lee, NP Ellouise Class, NP Tinnie Redman, PharmD Jaun Bash, PharmD   Please be sure to bring in all your medications bottles to every appointment.    Thank you for choosing [facility]

## 2023-10-03 ENCOUNTER — Encounter (HOSPITAL_COMMUNITY)
Admission: RE | Admit: 2023-10-03 | Discharge: 2023-10-03 | Disposition: A | Discharge status: Home / Self Care | Source: Ambulatory Visit | Attending: Cardiology | Admitting: Cardiology

## 2023-10-03 DIAGNOSIS — I2102 ST elevation (STEMI) myocardial infarction involving left anterior descending coronary artery: Secondary | ICD-10-CM | POA: Diagnosis present

## 2023-10-03 DIAGNOSIS — Z955 Presence of coronary angioplasty implant and graft: Secondary | ICD-10-CM | POA: Insufficient documentation

## 2023-10-04 ENCOUNTER — Encounter

## 2023-10-05 ENCOUNTER — Encounter (HOSPITAL_COMMUNITY)
Admission: RE | Admit: 2023-10-05 | Discharge: 2023-10-05 | Disposition: A | Discharge status: Home / Self Care | Source: Ambulatory Visit | Attending: Cardiology | Admitting: Cardiology

## 2023-10-05 DIAGNOSIS — Z955 Presence of coronary angioplasty implant and graft: Secondary | ICD-10-CM

## 2023-10-05 DIAGNOSIS — I2102 ST elevation (STEMI) myocardial infarction involving left anterior descending coronary artery: Secondary | ICD-10-CM | POA: Diagnosis not present

## 2023-10-08 ENCOUNTER — Encounter (HOSPITAL_COMMUNITY)
Admission: RE | Admit: 2023-10-08 | Discharge: 2023-10-08 | Disposition: A | Discharge status: Home / Self Care | Source: Ambulatory Visit | Attending: Cardiology | Admitting: Cardiology

## 2023-10-08 ENCOUNTER — Other Ambulatory Visit: Payer: Self-pay | Admitting: Internal Medicine

## 2023-10-08 DIAGNOSIS — I2102 ST elevation (STEMI) myocardial infarction involving left anterior descending coronary artery: Secondary | ICD-10-CM | POA: Diagnosis not present

## 2023-10-08 DIAGNOSIS — Z955 Presence of coronary angioplasty implant and graft: Secondary | ICD-10-CM

## 2023-10-09 ENCOUNTER — Encounter (HOSPITAL_COMMUNITY): Payer: Self-pay

## 2023-10-09 NOTE — Progress Notes (Signed)
 Cardiac Individual Treatment Plan  Patient Details  Name: Karen Cameron MRN: [id number] Date of Birth: May 05, 1965 Referring Provider:   Flowsheet Row INTENSIVE CARDIAC REHAB ORIENT from 09/18/2023 in [facility]  Referring Provider Morene Brownie, MD    Initial Encounter Date:  Flowsheet Row INTENSIVE CARDIAC REHAB ORIENT from 09/18/2023 in [facility]  Date 09/18/23    Visit Diagnosis: No diagnosis found.  Patient's Home Medications on Admission:  Current Outpatient Medications:    aspirin  81 MG chewable tablet, Chew 1 tablet (81 mg total) by mouth daily., Disp: 30 tablet, Rfl: 5   atorvastatin  (LIPITOR ) 80 MG tablet, Take 1 tablet (80 mg total) by mouth daily., Disp: 30 tablet, Rfl: 5   empagliflozin  (JARDIANCE ) 10 MG TABS tablet, Take 1 tablet (10 mg total) by mouth daily., Disp: 30 tablet, Rfl: 5   furosemide  (LASIX ) 20 MG tablet, Take Lasix  20 mg (1 tablet) as needed for weight gain > 3 lbs overnight or > 5 lbs in one week, increased shortness of breath, or increased swelling., Disp: 30 tablet, Rfl: 3   gabapentin  (NEURONTIN ) 100 MG capsule, Take 1 capsule (100 mg total) by mouth at bedtime for 7 days, THEN 2 capsules (200 mg total) at bedtime for 7 days, THEN 3 capsules (300 mg total) at bedtime for 7 days., Disp: 42 capsule, Rfl: 0   ivabradine  (CORLANOR ) 5 MG TABS tablet, Take 1 tablet (5 mg total) by mouth 2 (two) times daily with a meal., Disp: 60 tablet, Rfl: 5   metoprolol  succinate (TOPROL -XL) 25 MG 24 hr tablet, Take 1 tablet (25 mg total) by mouth daily., Disp: 30 tablet, Rfl: 5   nitroGLYCERIN  (NITROSTAT ) 0.4 MG SL tablet, Place 1 tablet (0.4 mg total) under the tongue every 5 (five) minutes as needed for chest pain., Disp: 100 tablet, Rfl: 3   sacubitril -valsartan  (ENTRESTO ) 24-26 MG, Take 1 tablet by mouth 2 (two) times daily., Disp: 60 tablet, Rfl: 5    spironolactone  (ALDACTONE ) 25 MG tablet, Take 1 tablet (25 mg total) by mouth daily., Disp: 30 tablet, Rfl: 5   ticagrelor  (BRILINTA ) 90 MG TABS tablet, Take 1 tablet (90 mg total) by mouth 2 (two) times daily., Disp: 60 tablet, Rfl: 5  Past Medical History: Past Medical History:  Diagnosis Date   Acute ST elevation myocardial infarction (STEMI) of anterolateral wall (HCC) 08/19/2023   Allergy     Anxiety    Depression    Encounter for annual health examination 05/21/2023   Hypertension    Nausea & vomiting    SAB (spontaneous abortion)    X2   SUI (stress urinary incontinence, female)     Tobacco Use: Social History   Tobacco Use  Smoking Status Never  Smokeless Tobacco Never    Labs: Review Flowsheet  More data exists      Latest Ref Rng & Units 08/21/2023 08/22/2023 08/23/2023 08/24/2023 08/25/2023  Labs for ITP Cardiac and Pulmonary Rehab  O2 Saturation % 60.7  79  61.4  63.9  56.5  65.1  63.9     Details       Multiple values from one day are sorted in reverse-chronological order          Exercise Target Goals: Exercise Program Goal: Individual exercise prescription set using results from initial 6 min walk test and THRR while considering  patient's activity barriers and safety.   Exercise Prescription  Goal: Initial exercise prescription builds to 30-45 minutes a day of aerobic activity, 2-3 days per week.  Home exercise guidelines will be given to patient during program as part of exercise prescription that the participant will acknowledge.   Education: Aerobic Exercise: - Group verbal and visual presentation on the components of exercise prescription. Introduces F.I.T.T principle from ACSM for exercise prescriptions.  Reviews F.I.T.T. principles of aerobic exercise including progression. Written material provided at class time.   Education: Resistance Exercise: - Group verbal and visual presentation on the components of exercise prescription. Introduces  F.I.T.T principle from ACSM for exercise prescriptions  Reviews F.I.T.T. principles of resistance exercise including progression. Written material provided at class time.    Education: Exercise & Equipment Safety: - Individual verbal instruction and demonstration of equipment use and safety with use of the equipment.   Education: Exercise Physiology & General Exercise Guidelines: - Group verbal and written instruction with models to review the exercise physiology of the cardiovascular system and associated critical values. Provides general exercise guidelines with specific guidelines to those with heart or lung disease. Written material provided at class time.   Education: Flexibility, Balance, Mind/Body Relaxation: - Group verbal and visual presentation with interactive activity on the components of exercise prescription. Introduces F.I.T.T principle from ACSM for exercise prescriptions. Reviews F.I.T.T. principles of flexibility and balance exercise training including progression. Also discusses the mind body connection.  Reviews various relaxation techniques to help reduce and manage stress (i.e. Deep breathing, progressive muscle relaxation, and visualization). Balance handout provided to take home. Written material provided at class time.   Activity Barriers & Risk Stratification:   6 Minute Walk:  6 Minute Walk     Row Name 09/18/23 1103         6 Minute Walk   Phase Initial     Distance 1343 feet     Walk Time 6 minutes     # of Rest Breaks 0     MPH 2.5     METS 3.8     RPE 8     Perceived Dyspnea  0     VO2 Peak 13.34     Symptoms No     Resting HR 88 bpm     Resting BP 92/62     Resting Oxygen Saturation  100 %     Max Ex. HR 115 bpm     Max Ex. BP 98/60     2 Minute Post BP 94/60        Oxygen Initial Assessment:   Oxygen Re-Evaluation:   Oxygen Discharge (Final Oxygen Re-Evaluation):   Initial Exercise Prescription:  Initial Exercise Prescription -  09/18/23 1100       Date of Initial Exercise RX and Referring Provider   Date 09/18/23    Referring Provider Morene Brownie, MD    Expected Discharge Date 12/14/23      Treadmill   MPH 1.8    Grade 0.5    Minutes 15    METs 2.5      Bike   Level 1    Watts 22    Minutes 15    METs 2.2      Prescription Details   Frequency (times per week) 3 days/week    Duration Progress to 30 minutes of continuous aerobic without signs/symptoms of physical distress      Intensity   THRR 40-80% of Max Heartrate 66-132    Ratings of Perceived Exertion 11-13    Perceived Dyspnea 0-4  Progression   Progression Continue to progress workloads to maintain intensity without signs/symptoms of physical distress.      Resistance Training   Training Prescription Yes    Weight 3    Reps 10-15          Perform Capillary Blood Glucose checks as needed.  Exercise Prescription Changes:   Exercise Prescription Changes     Row Name 09/24/23 1400 10/05/23 1700           Response to Exercise   Blood Pressure (Admit) 100/70 96/58      Blood Pressure (Exercise) 110/62 100/68      Blood Pressure (Exit) 92/66 92/64      Heart Rate (Admit) 85 bpm 70 bpm      Heart Rate (Exercise) 112 bpm 118 bpm      Heart Rate (Exit) 1.39 bpm 82 bpm      Rating of Perceived Exertion (Exercise) 12 9      Symptoms None None      Comments Pt's first day in the CRP2 program Reviewed METs      Duration Continue with 30 min of aerobic exercise without signs/symptoms of physical distress. Continue with 30 min of aerobic exercise without signs/symptoms of physical distress.      Intensity THRR unchanged THRR unchanged        Progression   Progression Continue to progress workloads to maintain intensity without signs/symptoms of physical distress. Continue to progress workloads to maintain intensity without signs/symptoms of physical distress.      Average METs 3.47 3.15        Resistance Training   Training  Prescription Yes Yes      Weight 3 lbs 3 lbs      Reps 10-15 10-15      Time 5 Minutes 5 Minutes        Interval Training   Interval Training No No        Treadmill   MPH 2 2.5      Grade 0.5 5      Minutes 15 15      METs 2.67 3.1        Bike   Level 1 2      Watts 42.5 22.7      Minutes 15 15      METs 4.27 3.2         Exercise Comments:   Exercise Comments     Row Name 09/18/23 1108 09/24/23 1422 10/05/23 1707       Exercise Comments Pt orientation, pt completed w/o unsual signs or symptoms. Pt mainted SR throughout test. Pt's first day in the CRP2 program. Pt tolerated session well with no complaints. Reviewded METs. It appears as though there may have been an error on day 1 bike METs. Pt is making progress. Pt exhibits low blood pressures but tolerates them. Pt will increase workload on treamill next week        Exercise Goals and Review:   Exercise Goals     Row Name 09/18/23 1107             Exercise Goals   Increase Physical Activity Yes       Intervention Develop an individualized exercise prescription for aerobic and resistive training based on initial evaluation findings, risk stratification, comorbidities and participant's personal goals.;Provide advice, education, support and counseling about physical activity/exercise needs.       Expected Outcomes Short Term: Attend rehab on a regular basis to increase  amount of physical activity.;Long Term: Add in home exercise to make exercise part of routine and to increase amount of physical activity.;Long Term: Exercising regularly at least 3-5 days a week.       Increase Strength and Stamina Yes       Intervention Provide advice, education, support and counseling about physical activity/exercise needs.;Develop an individualized exercise prescription for aerobic and resistive training based on initial evaluation findings, risk stratification, comorbidities and participant's personal goals.       Expected  Outcomes Short Term: Increase workloads from initial exercise prescription for resistance, speed, and METs.;Long Term: Improve cardiorespiratory fitness, muscular endurance and strength as measured by increased METs and functional capacity ( );Short Term: Perform resistance training exercises routinely during rehab and add in resistance training at home       Able to understand and use rate of perceived exertion (RPE) scale Yes       Intervention Provide education and explanation on how to use RPE scale       Expected Outcomes Short Term: Able to use RPE daily in rehab to express subjective intensity level;Long Term:  Able to use RPE to guide intensity level when exercising independently       Able to understand and use Dyspnea scale Yes       Intervention Provide education and explanation on how to use Dyspnea scale       Expected Outcomes Long Term: Able to use Dyspnea scale to guide intensity level when exercising independently;Short Term: Able to use Dyspnea scale daily in rehab to express subjective sense of shortness of breath during exertion       Knowledge and understanding of Target Heart Rate Range (THRR) Yes       Intervention Provide education and explanation of THRR including how the numbers were predicted and where they are located for reference       Expected Outcomes Short Term: Able to state/look up THRR;Long Term: Able to use THRR to govern intensity when exercising independently;Short Term: Able to use daily as guideline for intensity in rehab       Understanding of Exercise Prescription Yes       Intervention Provide education, explanation, and written materials on patient's individual exercise prescription       Expected Outcomes Short Term: Able to explain program exercise prescription;Long Term: Able to explain home exercise prescription to exercise independently          Exercise Goals Re-Evaluation :  Exercise Goals Re-Evaluation     Row Name 09/24/23 1421              Exercise Goal Re-Evaluation   Exercise Goals Review Increase Physical Activity;Understanding of Exercise Prescription;Increase Strength and Stamina;Knowledge and understanding of Target Heart Rate Range (THRR);Able to understand and use rate of perceived exertion (RPE) scale       Comments Pt's first day in the CRP2 program. Pt understands the exercise Rx, RPE scale and THRR.       Expected Outcomes Will continue to monitor patient and progress exercise workloads as tolerated.          Discharge Exercise Prescription (Final Exercise Prescription Changes):  Exercise Prescription Changes - 10/05/23 1700       Response to Exercise   Blood Pressure (Admit) 96/58    Blood Pressure (Exercise) 100/68    Blood Pressure (Exit) 92/64    Heart Rate (Admit) 70 bpm    Heart Rate (Exercise) 118 bpm    Heart Rate (Exit) 82  bpm    Rating of Perceived Exertion (Exercise) 9    Symptoms None    Comments Reviewed METs    Duration Continue with 30 min of aerobic exercise without signs/symptoms of physical distress.    Intensity THRR unchanged      Progression   Progression Continue to progress workloads to maintain intensity without signs/symptoms of physical distress.    Average METs 3.15      Resistance Training   Training Prescription Yes    Weight 3 lbs    Reps 10-15    Time 5 Minutes      Interval Training   Interval Training No      Treadmill   MPH 2.5    Grade 5    Minutes 15    METs 3.1      Bike   Level 2    Watts 22.7    Minutes 15    METs 3.2          Nutrition:  Target Goals: Understanding of nutrition guidelines, daily intake of sodium 1500mg , cholesterol 200mg , calories 30% from fat and 7% or less from saturated fats, daily to have 5 or more servings of fruits and vegetables.  Education: Nutrition 1 -Group instruction provided by verbal, written material, interactive activities, discussions, models, and posters to present general guidelines for heart healthy  nutrition including macronutrients, label reading, and promoting whole foods over processed counterparts. Education serves as Pensions consultant of discussion of heart healthy eating for all. Written material provided at class time.    Education: Nutrition 2 -Group instruction provided by verbal, written material, interactive activities, discussions, models, and posters to present general guidelines for heart healthy nutrition including sodium, cholesterol, and saturated fat. Providing guidance of habit forming to improve blood pressure, cholesterol, and body weight. Written material provided at class time.     Biometrics:  Pre Biometrics - 09/18/23 0757       Pre Biometrics   Waist Circumference 30 inches    Hip Circumference 35 inches    Waist to Hip Ratio 0.86 %    Triceps Skinfold 9 mm    % Body Fat 28.1 %    Grip Strength 20 kg    Flexibility 16.25 in    Single Leg Stand 30 seconds           Nutrition Therapy Plan and Nutrition Goals:   Nutrition Assessments:  MEDIFICTS Score Key: >=70 Need to make dietary changes  40-70 Heart Healthy Diet <= 40 Therapeutic Level Cholesterol Diet  Flowsheet Row INTENSIVE CARDIAC REHAB from 09/24/2023 in [facility]  Picture Your Plate Total Score on Admission 66   Picture Your Plate Scores: <59 Unhealthy dietary pattern with much room for improvement. 41-50 Dietary pattern unlikely to meet recommendations for good health and room for improvement. 51-60 More healthful dietary pattern, with some room for improvement.  >60 Healthy dietary pattern, although there may be some specific behaviors that could be improved.    Nutrition Goals Re-Evaluation:   Nutrition Goals Discharge (Final Nutrition Goals Re-Evaluation):   Psychosocial: Target Goals: Acknowledge presence or absence of significant depression and/or stress, maximize coping skills, provide positive support system.  Participant is able to verbalize types and ability to use techniques and skills needed for reducing stress and depression.   Education: Stress, Anxiety, and Depression - Group verbal and visual presentation to define topics covered.  Reviews how body is impacted by stress, anxiety, and depression.  Also discusses healthy ways to reduce stress and to treat/manage anxiety and depression. Written material provided at class time.   Education: Sleep Hygiene -Provides group verbal and written instruction about how sleep can affect your health.  Define sleep hygiene, discuss sleep cycles and impact of sleep habits. Review good sleep hygiene tips.   Initial Review & Psychosocial Screening:  Initial Psych Review & Screening - 09/18/23 0851       Initial Review   Current issues with Current Stress Concerns;Current Anxiety/Panic;Current Depression    Source of Stress Concerns Chronic Illness;Family      Family Dynamics   Good Support System? Yes   Pt has a partner, two sons, mom, and friends in her support system     Barriers   Psychosocial barriers to participate in program The patient should benefit from training in stress management and relaxation.      Screening Interventions   Interventions Encouraged to exercise;To provide support and resources with identified psychosocial needs;Provide feedback about the scores to participant    Expected Outcomes Long Term Goal: Stressors or current issues are controlled or eliminated.;Short Term goal: Identification and review with participant of any Quality of Life or Depression concerns found by scoring the questionnaire.;Long Term goal: The participant improves quality of Life and PHQ9 Scores as seen by post scores and/or verbalization of changes          Quality of Life Scores:   Quality of Life - 09/18/23 1102       Quality of Life   Select Quality of Life      Quality of Life Scores   Health/Function Pre 20.4 %    Socioeconomic Pre 29.14 %     Psych/Spiritual Pre 28.29 %    Family Pre 30 %    GLOBAL Pre 25.24 %         Scores of 19 and below usually indicate a poorer quality of life in these areas.  A difference of  2-3 points is a clinically meaningful difference.  A difference of 2-3 points in the total score of the Quality of Life Index has been associated with significant improvement in overall quality of life, self-image, physical symptoms, and general health in studies assessing change in quality of life.  PHQ-9: Review Flowsheet  More data exists      09/18/2023 05/21/2023 09/18/2022 05/16/2022 12/13/2020  Depression screen PHQ 2/9  Decreased Interest 1 0 0 0 0  Down, Depressed, Hopeless 1 0 0 0 0  PHQ - 2 Score 2 0 0 0 0  Altered sleeping 0 0 0 3 0  Tired, decreased energy 2 0 0 0 0  Change in appetite 1 0 0 0 0  Feeling bad or failure about yourself  0 0 0 0 0  Trouble concentrating 0 0 0 2 0  Moving slowly or fidgety/restless 1 0 0 0 0  Suicidal thoughts 0 0 0 0 0  PHQ-9 Score 6 0 0 5 0  Difficult doing work/chores Not difficult at all Not difficult at all Not difficult at all Somewhat difficult -   Interpretation of Total Score  Total Score Depression Severity:  1-4 = Minimal depression, 5-9 = Mild depression, 10-14 = Moderate depression, 15-19 = Moderately severe depression, 20-27 = Severe depression   Psychosocial Evaluation and Intervention:   Psychosocial Re-Evaluation:  Psychosocial Re-Evaluation     Row Name 09/25/23 0756 10/02/23 1620           Psychosocial Re-Evaluation  Current issues with Current Stress Concerns;Current Anxiety/Panic;Current Depression Current Stress Concerns;Current Anxiety/Panic;Current Depression      Comments [name] says she is still dealing with having a major coronary event. Will discuss PHQ9 in the upcoming future, [name] says she is still dealing with having a major coronary event. Discussed PHQ9 on 09/28/23 and score has improved from a 6 to a 4.      Expected  Outcomes [name] will have controlled or decreased anxiety/ stressors upon completion of cardiac rehab [name] will have controlled or decreased anxiety/ stressors upon completion of cardiac rehab      Interventions Stress management education;Relaxation education;Encouraged to attend Cardiac Rehabilitation for the exercise Stress management education;Relaxation education;Encouraged to attend Cardiac Rehabilitation for the exercise      Continue Psychosocial Services  Follow up required by staff Follow up required by staff        Initial Review   Source of Stress Concerns Chronic Illness --      Comments Will continue to monitor and offer support as needed. --         Psychosocial Discharge (Final Psychosocial Re-Evaluation):  Psychosocial Re-Evaluation - 10/02/23 1620       Psychosocial Re-Evaluation   Current issues with Current Stress Concerns;Current Anxiety/Panic;Current Depression    Comments [name] says she is still dealing with having a major coronary event. Discussed PHQ9 on 09/28/23 and score has improved from a 6 to a 4.    Expected Outcomes [name] will have controlled or decreased anxiety/ stressors upon completion of cardiac rehab    Interventions Stress management education;Relaxation education;Encouraged to attend Cardiac Rehabilitation for the exercise    Continue Psychosocial Services  Follow up required by staff          Vocational Rehabilitation: Provide vocational rehab assistance to qualifying candidates.   Vocational Rehab Evaluation & Intervention:  Vocational Rehab - 09/18/23 1103       Initial Vocational Rehab Evaluation & Intervention   Assessment shows need for Vocational Rehabilitation No   Pt currently employed         Education: Education Goals: Education classes will be provided on a variety of topics geared toward better understanding of heart health and risk factor modification. Participant will state understanding/return demonstration of  topics presented as noted by education test scores.  Learning Barriers/Preferences:  Learning Barriers/Preferences - 09/18/23 1102       Learning Barriers/Preferences   Learning Barriers None    Learning Preferences Verbal Instruction;Written Material;Skilled Demonstration          General Cardiac Education Topics:  AED/CPR: - Group verbal and written instruction with the use of models to demonstrate the basic use of the AED with the basic ABC's of resuscitation.   Test and Procedures: - Group verbal and visual presentation and models provide information about basic cardiac anatomy and function. Reviews the testing methods done to diagnose heart disease and the outcomes of the test results. Describes the treatment choices: Medical Management, Angioplasty, or Coronary Bypass Surgery for treating various heart conditions including Myocardial Infarction, Angina, Valve Disease, and Cardiac Arrhythmias. Written material provided at class time.   Medication Safety: - Group verbal and visual instruction to review commonly prescribed medications for heart and lung disease. Reviews the medication, class of the drug, and side effects. Includes the steps to properly store meds and maintain the prescription regimen. Written material provided at class time.   Intimacy: - Group verbal instruction through game format to discuss how heart and lung disease can  affect sexual intimacy. Written material provided at class time.   Know Your Numbers and Heart Failure: - Group verbal and visual instruction to discuss disease risk factors for cardiac and pulmonary disease and treatment options.  Reviews associated critical values for Overweight/Obesity, Hypertension, Cholesterol, and Diabetes.  Discusses basics of heart failure: signs/symptoms and treatments.  Introduces Heart Failure Zone chart for action plan for heart failure. Written material provided at class time.   Infection Prevention: - Provides  verbal and written material to individual with discussion of infection control including proper hand washing and proper equipment cleaning during exercise session.   Falls Prevention: - Provides verbal and written material to individual with discussion of falls prevention and safety.   Other: -Provides group and verbal instruction on various topics (see comments)   Knowledge Questionnaire Score:  Knowledge Questionnaire Score - 09/18/23 1030       Knowledge Questionnaire Score   Pre Score 24/24          Core Components/Risk Factors/Patient Goals at Admission:  Personal Goals and Risk Factors at Admission - 09/18/23 0859       Core Components/Risk Factors/Patient Goals on Admission    Weight Management Yes;Weight Maintenance    Intervention Weight Management: Develop a combined nutrition and exercise program designed to reach desired caloric intake, while maintaining appropriate intake of nutrient and fiber, sodium and fats, and appropriate energy expenditure required for the weight goal.;Weight Management: Provide education and appropriate resources to help participant work on and attain dietary goals.    Admit Weight 129 lb 13.6 oz (58.9 kg)    Expected Outcomes Weight Maintenance: Understanding of the daily nutrition guidelines, which includes 25-35% calories from fat, 7% or less cal from saturated fats, less than 200mg  cholesterol, less than 1.5gm of sodium, & 5 or more servings of fruits and vegetables daily;Understanding recommendations for meals to include 15-35% energy as protein, 25-35% energy from fat, 35-60% energy from carbohydrates, less than 200mg  of dietary cholesterol, 20-35 gm of total fiber daily;Understanding of distribution of calorie intake throughout the day with the consumption of 4-5 meals/snacks    Heart Failure Yes    Intervention Provide a combined exercise and nutrition program that is supplemented with education, support and counseling about heart failure.  Directed toward relieving symptoms such as shortness of breath, decreased exercise tolerance, and extremity edema.    Expected Outcomes Short term: Attendance in program 2-3 days a week with increased exercise capacity. Reported lower sodium intake. Reported increased fruit and vegetable intake. Reports medication compliance.;Short term: Daily weights obtained and reported for increase. Utilizing diuretic protocols set by physician.;Long term: Adoption of self-care skills and reduction of barriers for early signs and symptoms recognition and intervention leading to self-care maintenance.;Improve functional capacity of life    Hypertension Yes    Intervention Provide education on lifestyle modifcations including regular physical activity/exercise, weight management, moderate sodium restriction and increased consumption of fresh fruit, vegetables, and low fat dairy, alcohol moderation, and smoking cessation.;Monitor prescription use compliance.    Expected Outcomes Short Term: Continued assessment and intervention until BP is < 140/80mm HG in hypertensive participants. < 130/25mm HG in hypertensive participants with diabetes, heart failure or chronic kidney disease.;Long Term: Maintenance of blood pressure at goal levels.    Lipids Yes    Intervention Provide education and support for participant on nutrition & aerobic/resistive exercise along with prescribed medications to achieve LDL 70mg , HDL >40mg .    Expected Outcomes Short Term: Participant states understanding of desired cholesterol  values and is compliant with medications prescribed. Participant is following exercise prescription and nutrition guidelines.;Long Term: Cholesterol controlled with medications as prescribed, with individualized exercise RX and with personalized nutrition plan. Value goals: LDL < 70mg , HDL > 40 mg.    Stress Yes    Intervention Offer individual and/or small group education and counseling on adjustment to heart disease,  stress management and health-related lifestyle change. Teach and support self-help strategies.;Refer participants experiencing significant psychosocial distress to appropriate mental health specialists for further evaluation and treatment. When possible, include family members and significant others in education/counseling sessions.    Expected Outcomes Short Term: Participant demonstrates changes in health-related behavior, relaxation and other stress management skills, ability to obtain effective social support, and compliance with psychotropic medications if prescribed.;Long Term: Emotional wellbeing is indicated by absence of clinically significant psychosocial distress or social isolation.          Education:Diabetes - Individual verbal and written instruction to review signs/symptoms of diabetes, desired ranges of glucose level fasting, after meals and with exercise. Acknowledge that pre and post exercise glucose checks will be done for 3 sessions at entry of program.   Core Components/Risk Factors/Patient Goals Review:   Goals and Risk Factor Review     Row Name 09/18/23 0856 10/02/23 1624           Core Components/Risk Factors/Patient Goals Review   Personal Goals Review -- Heart Failure;Hypertension;Lipids;Stress;Weight Management/Obesity      Review -- [name] is doing well with exercise at cardiac rehab. VSS. [name] has begun to increase her treadmill met levels.      Expected Outcomes -- [name] will continue to particpate in cardiac rehab for exercise nutriton and lifestyle modifications.         Core Components/Risk Factors/Patient Goals at Discharge (Final Review):   Goals and Risk Factor Review - 10/02/23 1624       Core Components/Risk Factors/Patient Goals Review   Personal Goals Review Heart Failure;Hypertension;Lipids;Stress;Weight Management/Obesity    Review [name] is doing well with exercise at cardiac rehab. VSS. [name] has begun to increase her treadmill  met levels.    Expected Outcomes [name] will continue to particpate in cardiac rehab for exercise nutriton and lifestyle modifications.          ITP Comments:  ITP Comments     Row Name 09/18/23 0757 09/25/23 0755 10/02/23 1617       ITP Comments Wilbert Bihari, MD: Medical Director.  Introduction to the [facility] / Intensive Cardiac Rehab.  Intial orientation packet reviewed with the patient. 30 Day ITP Review. [name] started cardiac rehab on 09/25/23. Camille did well with exercise. 30 Day ITP Review. [name] is doing well with exercise at cardiac rehab.        Comments: see ITP comments

## 2023-10-10 ENCOUNTER — Other Ambulatory Visit (HOSPITAL_COMMUNITY): Payer: Self-pay

## 2023-10-10 ENCOUNTER — Encounter (HOSPITAL_COMMUNITY)
Admission: RE | Admit: 2023-10-10 | Discharge: 2023-10-10 | Disposition: A | Discharge status: Home / Self Care | Source: Ambulatory Visit | Attending: Cardiology | Admitting: Cardiology

## 2023-10-10 DIAGNOSIS — Z955 Presence of coronary angioplasty implant and graft: Secondary | ICD-10-CM

## 2023-10-10 DIAGNOSIS — I2102 ST elevation (STEMI) myocardial infarction involving left anterior descending coronary artery: Secondary | ICD-10-CM | POA: Diagnosis not present

## 2023-10-10 MED ORDER — GABAPENTIN 300 MG PO CAPS
300.0000 mg | ORAL_CAPSULE | Freq: Every day | ORAL | 5 refills | Status: DC
  Filled 2023-10-10: qty 30, 30d supply, fill #0
  Filled 2023-11-05: qty 30, 30d supply, fill #1
  Filled 2023-12-01 – 2023-12-03 (×2): qty 30, 30d supply, fill #2

## 2023-10-10 MED ORDER — GABAPENTIN 100 MG PO CAPS
ORAL_CAPSULE | ORAL | 0 refills | Status: DC
  Filled 2023-10-10: qty 42, 21d supply, fill #0

## 2023-10-11 ENCOUNTER — Ambulatory Visit (HOSPITAL_BASED_OUTPATIENT_CLINIC_OR_DEPARTMENT_OTHER)

## 2023-10-11 ENCOUNTER — Other Ambulatory Visit: Payer: Self-pay

## 2023-10-12 ENCOUNTER — Encounter (HOSPITAL_COMMUNITY)
Admission: RE | Admit: 2023-10-12 | Discharge: 2023-10-12 | Disposition: A | Discharge status: Home / Self Care | Source: Ambulatory Visit | Attending: Cardiology | Admitting: Cardiology

## 2023-10-12 DIAGNOSIS — Z955 Presence of coronary angioplasty implant and graft: Secondary | ICD-10-CM

## 2023-10-12 DIAGNOSIS — I2102 ST elevation (STEMI) myocardial infarction involving left anterior descending coronary artery: Secondary | ICD-10-CM

## 2023-10-15 ENCOUNTER — Encounter (HOSPITAL_COMMUNITY)
Admission: RE | Admit: 2023-10-15 | Discharge: 2023-10-15 | Disposition: A | Discharge status: Home / Self Care | Source: Ambulatory Visit | Attending: Cardiology | Admitting: Cardiology

## 2023-10-15 DIAGNOSIS — I2102 ST elevation (STEMI) myocardial infarction involving left anterior descending coronary artery: Secondary | ICD-10-CM | POA: Diagnosis not present

## 2023-10-15 DIAGNOSIS — Z955 Presence of coronary angioplasty implant and graft: Secondary | ICD-10-CM

## 2023-10-17 ENCOUNTER — Encounter (HOSPITAL_COMMUNITY)
Admission: RE | Admit: 2023-10-17 | Discharge: 2023-10-17 | Disposition: A | Source: Ambulatory Visit | Attending: Cardiology

## 2023-10-17 ENCOUNTER — Telehealth (HOSPITAL_COMMUNITY): Payer: Self-pay

## 2023-10-17 DIAGNOSIS — I2102 ST elevation (STEMI) myocardial infarction involving left anterior descending coronary artery: Secondary | ICD-10-CM

## 2023-10-17 DIAGNOSIS — Z955 Presence of coronary angioplasty implant and graft: Secondary | ICD-10-CM

## 2023-10-17 NOTE — Telephone Encounter (Signed)
 FMLA/DISABILITY paper work have been faxed into [employer]. Patient called and is aware

## 2023-10-19 ENCOUNTER — Encounter (HOSPITAL_COMMUNITY)
Admission: RE | Admit: 2023-10-19 | Discharge: 2023-10-19 | Disposition: A | Source: Ambulatory Visit | Attending: Cardiology

## 2023-10-19 DIAGNOSIS — I2102 ST elevation (STEMI) myocardial infarction involving left anterior descending coronary artery: Secondary | ICD-10-CM

## 2023-10-19 DIAGNOSIS — Z955 Presence of coronary angioplasty implant and graft: Secondary | ICD-10-CM

## 2023-10-22 ENCOUNTER — Encounter (HOSPITAL_COMMUNITY)
Admission: RE | Admit: 2023-10-22 | Discharge: 2023-10-22 | Disposition: A | Source: Ambulatory Visit | Attending: Cardiology

## 2023-10-22 DIAGNOSIS — Z955 Presence of coronary angioplasty implant and graft: Secondary | ICD-10-CM

## 2023-10-22 DIAGNOSIS — I2102 ST elevation (STEMI) myocardial infarction involving left anterior descending coronary artery: Secondary | ICD-10-CM | POA: Diagnosis not present

## 2023-10-24 ENCOUNTER — Encounter (HOSPITAL_COMMUNITY)
Admission: RE | Admit: 2023-10-24 | Discharge: 2023-10-24 | Disposition: A | Source: Ambulatory Visit | Attending: Cardiology

## 2023-10-24 ENCOUNTER — Other Ambulatory Visit (HOSPITAL_COMMUNITY): Payer: Self-pay

## 2023-10-24 DIAGNOSIS — I2102 ST elevation (STEMI) myocardial infarction involving left anterior descending coronary artery: Secondary | ICD-10-CM

## 2023-10-24 DIAGNOSIS — Z955 Presence of coronary angioplasty implant and graft: Secondary | ICD-10-CM

## 2023-10-25 DIAGNOSIS — I213 ST elevation (STEMI) myocardial infarction of unspecified site: Secondary | ICD-10-CM | POA: Diagnosis not present

## 2023-10-26 ENCOUNTER — Encounter (HOSPITAL_COMMUNITY)
Admission: RE | Admit: 2023-10-26 | Discharge: 2023-10-26 | Disposition: A | Discharge status: Home / Self Care | Source: Ambulatory Visit | Attending: Cardiology | Admitting: Cardiology

## 2023-10-26 DIAGNOSIS — I2102 ST elevation (STEMI) myocardial infarction involving left anterior descending coronary artery: Secondary | ICD-10-CM

## 2023-10-26 DIAGNOSIS — Z955 Presence of coronary angioplasty implant and graft: Secondary | ICD-10-CM

## 2023-10-26 NOTE — Progress Notes (Signed)
 Reviewed home exercise Rx with patient today.  Encouraged warm-up, cool-down, and stretching. Reviewed THRR of 66 - 132 and keeping RPE between 11-13. Encouraged to hydrate with activity.  Reviewed weather parameters for temperature and humidity for safe exercise outdoors. Reviewed S/S to terminate exercise and when to call 911 vs MD. Reviewed the use of NTG and pt was encouraged to carry at all times. Pt encouraged to always carry a cell phone for safety when exercising outdoors. Pt verbalized understanding of the home exercise Rx and was provided a copy.   Lorin Picket MS, ACSM-CEP, CCRP

## 2023-10-29 ENCOUNTER — Encounter (HOSPITAL_COMMUNITY)
Admission: RE | Admit: 2023-10-29 | Discharge: 2023-10-29 | Disposition: A | Discharge status: Home / Self Care | Source: Ambulatory Visit | Attending: Cardiology | Admitting: Cardiology

## 2023-10-29 DIAGNOSIS — I2102 ST elevation (STEMI) myocardial infarction involving left anterior descending coronary artery: Secondary | ICD-10-CM | POA: Diagnosis not present

## 2023-10-29 DIAGNOSIS — Z955 Presence of coronary angioplasty implant and graft: Secondary | ICD-10-CM

## 2023-10-31 ENCOUNTER — Encounter (HOSPITAL_COMMUNITY)
Admission: RE | Admit: 2023-10-31 | Discharge: 2023-10-31 | Disposition: A | Source: Ambulatory Visit | Attending: Cardiology

## 2023-10-31 DIAGNOSIS — I2102 ST elevation (STEMI) myocardial infarction involving left anterior descending coronary artery: Secondary | ICD-10-CM | POA: Diagnosis not present

## 2023-10-31 DIAGNOSIS — Z955 Presence of coronary angioplasty implant and graft: Secondary | ICD-10-CM

## 2023-11-02 ENCOUNTER — Encounter (HOSPITAL_COMMUNITY)
Admission: RE | Admit: 2023-11-02 | Discharge: 2023-11-02 | Disposition: A | Discharge status: Home / Self Care | Source: Ambulatory Visit | Attending: Cardiology | Admitting: Cardiology

## 2023-11-02 DIAGNOSIS — I2102 ST elevation (STEMI) myocardial infarction involving left anterior descending coronary artery: Secondary | ICD-10-CM

## 2023-11-02 DIAGNOSIS — Z955 Presence of coronary angioplasty implant and graft: Secondary | ICD-10-CM

## 2023-11-05 ENCOUNTER — Encounter (HOSPITAL_COMMUNITY)
Admission: RE | Admit: 2023-11-05 | Discharge: 2023-11-05 | Disposition: A | Discharge status: Home / Self Care | Source: Ambulatory Visit | Attending: Cardiology | Admitting: Cardiology

## 2023-11-05 ENCOUNTER — Other Ambulatory Visit: Payer: Self-pay

## 2023-11-05 DIAGNOSIS — Z955 Presence of coronary angioplasty implant and graft: Secondary | ICD-10-CM | POA: Diagnosis not present

## 2023-11-05 DIAGNOSIS — I2102 ST elevation (STEMI) myocardial infarction involving left anterior descending coronary artery: Secondary | ICD-10-CM | POA: Insufficient documentation

## 2023-11-06 NOTE — Progress Notes (Signed)
 Cardiac Individual Treatment Plan  Patient Details  Name: Karen Cameron MRN: [id number] Date of Birth: 12-01-1965 Referring Provider:   Flowsheet Row INTENSIVE CARDIAC REHAB ORIENT from 09/18/2023 in [facility]  Referring Provider Morene Brownie, MD    Initial Encounter Date:  Flowsheet Row INTENSIVE CARDIAC REHAB ORIENT from 09/18/2023 in [facility]  Date 09/18/23    Visit Diagnosis: 08/19/23 ST elevation myocardial infarction involving left anterior descending (LAD) coronary artery (HCC)  08/19/23 Status post coronary artery stent placement  Patient's Home Medications on Admission:  Current Outpatient Medications:    aspirin  81 MG chewable tablet, Chew 1 tablet (81 mg total) by mouth daily., Disp: 30 tablet, Rfl: 5   atorvastatin  (LIPITOR ) 80 MG tablet, Take 1 tablet (80 mg total) by mouth daily., Disp: 30 tablet, Rfl: 5   empagliflozin  (JARDIANCE ) 10 MG TABS tablet, Take 1 tablet (10 mg total) by mouth daily., Disp: 30 tablet, Rfl: 5   furosemide  (LASIX ) 20 MG tablet, Take Lasix  20 mg (1 tablet) as needed for weight gain > 3 lbs overnight or > 5 lbs in one week, increased shortness of breath, or increased swelling., Disp: 30 tablet, Rfl: 3   gabapentin  (NEURONTIN ) 100 MG capsule, Take 1 capsule (100 mg total) by mouth at bedtime for 7 days, THEN 2 capsules (200 mg total) at bedtime for 7 days, THEN 3 capsules (300 mg total) at bedtime for 7 days., Disp: 42 capsule, Rfl: 0   gabapentin  (NEURONTIN ) 300 MG capsule, Take 1 capsule (300 mg total) by mouth at bedtime., Disp: 30 capsule, Rfl: 5   ivabradine  (CORLANOR ) 5 MG TABS tablet, Take 1 tablet (5 mg total) by mouth 2 (two) times daily with a meal., Disp: 60 tablet, Rfl: 5   metoprolol  succinate (TOPROL -XL) 25 MG 24 hr tablet, Take 1 tablet (25 mg total) by mouth daily., Disp: 30 tablet, Rfl: 5   nitroGLYCERIN   (NITROSTAT ) 0.4 MG SL tablet, Place 1 tablet (0.4 mg total) under the tongue every 5 (five) minutes as needed for chest pain., Disp: 100 tablet, Rfl: 3   sacubitril -valsartan  (ENTRESTO ) 24-26 MG, Take 1 tablet by mouth 2 (two) times daily., Disp: 60 tablet, Rfl: 5   spironolactone  (ALDACTONE ) 25 MG tablet, Take 1 tablet (25 mg total) by mouth daily., Disp: 30 tablet, Rfl: 5   ticagrelor  (BRILINTA ) 90 MG TABS tablet, Take 1 tablet (90 mg total) by mouth 2 (two) times daily., Disp: 60 tablet, Rfl: 5   valACYclovir  (VALTREX ) 500 MG tablet, Take 1 tablet (500 mg total) by mouth 2 (two) times daily for 5 days as needed, Disp: 30 tablet, Rfl: 11  Past Medical History: Past Medical History:  Diagnosis Date   Acute ST elevation myocardial infarction (STEMI) of anterolateral wall (HCC) 08/19/2023   Allergy     Anxiety    Depression    Encounter for annual health examination 05/21/2023   Hypertension    Nausea & vomiting    SAB (spontaneous abortion)    X2   SUI (stress urinary incontinence, female)     Tobacco Use: Social History   Tobacco Use  Smoking Status Never  Smokeless Tobacco Never    Labs: Review Flowsheet  More data exists      Latest Ref Rng & Units 08/21/2023 08/22/2023 08/23/2023 08/24/2023 08/25/2023  Labs for ITP Cardiac and Pulmonary Rehab  O2 Saturation % 60.7  79  61.4  63.9  56.5  65.1  63.9     Details       Multiple values from one day are sorted in reverse-chronological order         Capillary Blood Glucose: Lab Results  Component Value Date   GLUCAP 154 (H) 08/19/2023   GLUCAP 149 (H) 08/19/2023     Exercise Target Goals: Exercise Program Goal: Individual exercise prescription set using results from initial 6 min walk test and THRR while considering  patient's activity barriers and safety.   Exercise Prescription Goal: Initial exercise prescription builds to 30-45 minutes a day of aerobic activity, 2-3 days per week.  Home exercise guidelines will be  given to patient during program as part of exercise prescription that the participant will acknowledge.  Activity Barriers & Risk Stratification:   6 Minute Walk:  6 Minute Walk     Row Name 09/18/23 1103         6 Minute Walk   Phase Initial     Distance 1343 feet     Walk Time 6 minutes     # of Rest Breaks 0     MPH 2.5     METS 3.8     RPE 8     Perceived Dyspnea  0     VO2 Peak 13.34     Symptoms No     Resting HR 88 bpm     Resting BP 92/62     Resting Oxygen Saturation  100 %     Max Ex. HR 115 bpm     Max Ex. BP 98/60     2 Minute Post BP 94/60        Oxygen Initial Assessment:   Oxygen Re-Evaluation:   Oxygen Discharge (Final Oxygen Re-Evaluation):   Initial Exercise Prescription:  Initial Exercise Prescription - 09/18/23 1100       Date of Initial Exercise RX and Referring Provider   Date 09/18/23    Referring Provider Morene Brownie, MD    Expected Discharge Date 12/14/23      Treadmill   MPH 1.8    Grade 0.5    Minutes 15    METs 2.5      Bike   Level 1    Watts 22    Minutes 15    METs 2.2      Prescription Details   Frequency (times per week) 3 days/week    Duration Progress to 30 minutes of continuous aerobic without signs/symptoms of physical distress      Intensity   THRR 40-80% of Max Heartrate 66-132    Ratings of Perceived Exertion 11-13    Perceived Dyspnea 0-4      Progression   Progression Continue to progress workloads to maintain intensity without signs/symptoms of physical distress.      Resistance Training   Training Prescription Yes    Weight 3    Reps 10-15          Perform Capillary Blood Glucose checks as needed.  Exercise Prescription Changes:   Exercise Prescription Changes     Row Name 09/24/23 1400 10/05/23 1700 10/24/23 1500 10/26/23 1400 11/02/23 1500     Response to Exercise   Blood Pressure (Admit) 100/70 96/58 90/62  94/60 96/64   Blood Pressure (Exercise) 110/62 100/68 -- -- --    Blood Pressure (Exit) 92/66 92/64 92/54  90/62 93/61   Heart Rate (Admit) 85 bpm 70 bpm 83 bpm 84 bpm 77 bpm   Heart Rate (  Exercise) 112 bpm 118 bpm 115 bpm 114 bpm 113 bpm   Heart Rate (Exit) 1.39 bpm 82 bpm 91 bpm 93 bpm 86 bpm   Rating of Perceived Exertion (Exercise) 12 9 9 8 9    Symptoms None None None None None   Comments Pt's first day in the CRP2 program Reviewed METs Reviewed METs and goals Reviewed home exercise Rx Reviewed METs   Duration Continue with 30 min of aerobic exercise without signs/symptoms of physical distress. Continue with 30 min of aerobic exercise without signs/symptoms of physical distress. Continue with 30 min of aerobic exercise without signs/symptoms of physical distress. Continue with 30 min of aerobic exercise without signs/symptoms of physical distress. Continue with 30 min of aerobic exercise without signs/symptoms of physical distress.   Intensity THRR unchanged THRR unchanged THRR unchanged THRR unchanged THRR unchanged     Progression   Progression Continue to progress workloads to maintain intensity without signs/symptoms of physical distress. Continue to progress workloads to maintain intensity without signs/symptoms of physical distress. Continue to progress workloads to maintain intensity without signs/symptoms of physical distress. Continue to progress workloads to maintain intensity without signs/symptoms of physical distress. Continue to progress workloads to maintain intensity without signs/symptoms of physical distress.   Average METs 3.47 3.15 3.25 3.55 3.8     Resistance Training   Training Prescription Yes Yes No Yes Yes   Weight 3 lbs 3 lbs No wts on wednesdays 5 lbs 5 lbs   Reps 10-15 10-15 -- 10-15 10-15   Time 5 Minutes 5 Minutes -- 5 Minutes 5 Minutes     Interval Training   Interval Training No No No No No     Treadmill   MPH 2 2.5 2.5 2.7 2.8   Grade 0.5 5 1  1.5 2   Minutes 15 15 15 15 15    METs 2.67 3.1 3.26 3.63 3.91     Bike    Level 1 2 2 2 3    Watts 42.5 22.7 23.7 30 33   Minutes 15 15 15 15 15    METs 4.27 3.2 3.25 3.5 3.75     Home Exercise Plan   Plans to continue exercise at -- -- -- Home (comment) Home (comment)   Frequency -- -- -- Add 2 additional days to program exercise sessions. Add 2 additional days to program exercise sessions.   Initial Home Exercises Provided -- -- -- 10/26/23 10/26/23      Exercise Comments:   Exercise Comments     Row Name 09/18/23 1108 09/24/23 1422 10/05/23 1707 10/24/23 1500 10/26/23 1429   Exercise Comments Pt orientation, pt completed w/o unsual signs or symptoms. Pt mainted SR throughout test. Pt's first day in the CRP2 program. Pt tolerated session well with no complaints. Reviewded METs. It appears as though there may have been an error on day 1 bike METs. Pt is making progress. Pt exhibits low blood pressures but tolerates them. Pt will increase workload on treamill next week Reviewed METs and goals today. Pt is making progress. Pt will increase workloads on the bike and treadmill next session. Reviewed home exercise Rx with patient today. Pt will walk and retrun to her yoga class. Encouraged 2x/week 30 minutes to achieved her 150 minutes of exercise goal. Pt verbalized understanding of the home exercise Rx and was provided a copy.    Row Name 11/02/23 1529           Exercise Comments Reviewed METs. Pt is making progress. Has increased  on treadmill and bike since last review. Pt voices returning to her yoga class, whcih was a goal. She is tolerating the yoga class well.          Exercise Goals and Review:   Exercise Goals     Row Name 09/18/23 1107             Exercise Goals   Increase Physical Activity Yes       Intervention Develop an individualized exercise prescription for aerobic and resistive training based on initial evaluation findings, risk stratification, comorbidities and participant's personal goals.;Provide advice, education, support and  counseling about physical activity/exercise needs.       Expected Outcomes Short Term: Attend rehab on a regular basis to increase amount of physical activity.;Long Term: Add in home exercise to make exercise part of routine and to increase amount of physical activity.;Long Term: Exercising regularly at least 3-5 days a week.       Increase Strength and Stamina Yes       Intervention Provide advice, education, support and counseling about physical activity/exercise needs.;Develop an individualized exercise prescription for aerobic and resistive training based on initial evaluation findings, risk stratification, comorbidities and participant's personal goals.       Expected Outcomes Short Term: Increase workloads from initial exercise prescription for resistance, speed, and METs.;Long Term: Improve cardiorespiratory fitness, muscular endurance and strength as measured by increased METs and functional capacity ( );Short Term: Perform resistance training exercises routinely during rehab and add in resistance training at home       Able to understand and use rate of perceived exertion (RPE) scale Yes       Intervention Provide education and explanation on how to use RPE scale       Expected Outcomes Short Term: Able to use RPE daily in rehab to express subjective intensity level;Long Term:  Able to use RPE to guide intensity level when exercising independently       Able to understand and use Dyspnea scale Yes       Intervention Provide education and explanation on how to use Dyspnea scale       Expected Outcomes Long Term: Able to use Dyspnea scale to guide intensity level when exercising independently;Short Term: Able to use Dyspnea scale daily in rehab to express subjective sense of shortness of breath during exertion       Knowledge and understanding of Target Heart Rate Range (THRR) Yes       Intervention Provide education and explanation of THRR including how the numbers were predicted and where  they are located for reference       Expected Outcomes Short Term: Able to state/look up THRR;Long Term: Able to use THRR to govern intensity when exercising independently;Short Term: Able to use daily as guideline for intensity in rehab       Understanding of Exercise Prescription Yes       Intervention Provide education, explanation, and written materials on patient's individual exercise prescription       Expected Outcomes Short Term: Able to explain program exercise prescription;Long Term: Able to explain home exercise prescription to exercise independently          Exercise Goals Re-Evaluation :  Exercise Goals Re-Evaluation     Row Name 09/24/23 1421 10/24/23 1500           Exercise Goal Re-Evaluation   Exercise Goals Review Increase Physical Activity;Understanding of Exercise Prescription;Increase Strength and Stamina;Knowledge and understanding of Target Heart Rate Range (THRR);Able to  understand and use rate of perceived exertion (RPE) scale Increase Physical Activity;Understanding of Exercise Prescription;Increase Strength and Stamina;Knowledge and understanding of Target Heart Rate Range (THRR);Able to understand and use rate of perceived exertion (RPE) scale      Comments Pt's first day in the CRP2 program. Pt understands the exercise Rx, RPE scale and THRR. Reviewed METs and goals today. Pt voices progress on her goals of incresed stamina, climbing stairs with less/no SOB, and increased energy. Peak METs to date are 3.3.      Expected Outcomes Will continue to monitor patient and progress exercise workloads as tolerated. Will continue to monitor patient and progress exercise workloads as tolerated.         Discharge Exercise Prescription (Final Exercise Prescription Changes):  Exercise Prescription Changes - 11/02/23 1500       Response to Exercise   Blood Pressure (Admit) 96/64    Blood Pressure (Exit) 93/61    Heart Rate (Admit) 77 bpm    Heart Rate (Exercise) 113 bpm     Heart Rate (Exit) 86 bpm    Rating of Perceived Exertion (Exercise) 9    Symptoms None    Comments Reviewed METs    Duration Continue with 30 min of aerobic exercise without signs/symptoms of physical distress.    Intensity THRR unchanged      Progression   Progression Continue to progress workloads to maintain intensity without signs/symptoms of physical distress.    Average METs 3.8      Resistance Training   Training Prescription Yes    Weight 5 lbs    Reps 10-15    Time 5 Minutes      Interval Training   Interval Training No      Treadmill   MPH 2.8    Grade 2    Minutes 15    METs 3.91      Bike   Level 3    Watts 33    Minutes 15    METs 3.75      Home Exercise Plan   Plans to continue exercise at Home (comment)    Frequency Add 2 additional days to program exercise sessions.    Initial Home Exercises Provided 10/26/23          Nutrition:  Target Goals: Understanding of nutrition guidelines, daily intake of sodium 1500mg , cholesterol 200mg , calories 30% from fat and 7% or less from saturated fats, daily to have 5 or more servings of fruits and vegetables.  Biometrics:  Pre Biometrics - 09/18/23 0757       Pre Biometrics   Waist Circumference 30 inches    Hip Circumference 35 inches    Waist to Hip Ratio 0.86 %    Triceps Skinfold 9 mm    % Body Fat 28.1 %    Grip Strength 20 kg    Flexibility 16.25 in    Single Leg Stand 30 seconds           Nutrition Therapy Plan and Nutrition Goals:   Nutrition Assessments:  MEDIFICTS Score Key: >=70 Need to make dietary changes  40-70 Heart Healthy Diet <= 40 Therapeutic Level Cholesterol Diet   Flowsheet Row INTENSIVE CARDIAC REHAB from 09/24/2023 in [facility]  Picture Your Plate Total Score on Admission 66   Picture Your Plate Scores: <59 Unhealthy dietary pattern with much room for improvement. 41-50 Dietary pattern unlikely to  meet recommendations for good health and room  for improvement. 51-60 More healthful dietary pattern, with some room for improvement.  >60 Healthy dietary pattern, although there may be some specific behaviors that could be improved.    Nutrition Goals Re-Evaluation:   Nutrition Goals Re-Evaluation:   Nutrition Goals Discharge (Final Nutrition Goals Re-Evaluation):   Psychosocial: Target Goals: Acknowledge presence or absence of significant depression and/or stress, maximize coping skills, provide positive support system. Participant is able to verbalize types and ability to use techniques and skills needed for reducing stress and depression.  Initial Review & Psychosocial Screening:  Initial Psych Review & Screening - 09/18/23 0851       Initial Review   Current issues with Current Stress Concerns;Current Anxiety/Panic;Current Depression    Source of Stress Concerns Chronic Illness;Family      Family Dynamics   Good Support System? Yes   Pt has a partner, two sons, mom, and friends in her support system     Barriers   Psychosocial barriers to participate in program The patient should benefit from training in stress management and relaxation.      Screening Interventions   Interventions Encouraged to exercise;To provide support and resources with identified psychosocial needs;Provide feedback about the scores to participant    Expected Outcomes Long Term Goal: Stressors or current issues are controlled or eliminated.;Short Term goal: Identification and review with participant of any Quality of Life or Depression concerns found by scoring the questionnaire.;Long Term goal: The participant improves quality of Life and PHQ9 Scores as seen by post scores and/or verbalization of changes          Quality of Life Scores:  Quality of Life - 09/18/23 1102       Quality of Life   Select Quality of Life      Quality of Life Scores   Health/Function Pre 20.4 %    Socioeconomic Pre  29.14 %    Psych/Spiritual Pre 28.29 %    Family Pre 30 %    GLOBAL Pre 25.24 %         Scores of 19 and below usually indicate a poorer quality of life in these areas.  A difference of  2-3 points is a clinically meaningful difference.  A difference of 2-3 points in the total score of the Quality of Life Index has been associated with significant improvement in overall quality of life, self-image, physical symptoms, and general health in studies assessing change in quality of life.  PHQ-9: Review Flowsheet  More data exists      10/31/2023 09/18/2023 05/21/2023 09/18/2022 05/16/2022  Depression screen PHQ 2/9  Decreased Interest 0 1 0 0 0  Down, Depressed, Hopeless 0 1 0 0 0  PHQ - 2 Score 0 2 0 0 0  Altered sleeping 0 0 0 0 3  Tired, decreased energy 0 2 0 0 0  Change in appetite 0 1 0 0 0  Feeling bad or failure about yourself  0 0 0 0 0  Trouble concentrating 0 0 0 0 2  Moving slowly or fidgety/restless 0 1 0 0 0  Suicidal thoughts 0 0 0 0 0  PHQ-9 Score 0 6 0 0 5  Difficult doing work/chores Not difficult at all Not difficult at all Not difficult at all Not difficult at all Somewhat difficult   Interpretation of Total Score  Total Score Depression Severity:  1-4 = Minimal depression, 5-9 = Mild depression, 10-14 = Moderate depression, 15-19 = Moderately severe depression, 20-27 = Severe depression  Psychosocial Evaluation and Intervention:   Psychosocial Re-Evaluation:  Psychosocial Re-Evaluation     Row Name 09/25/23 0756 10/02/23 1620 11/02/23 1101         Psychosocial Re-Evaluation   Current issues with Current Stress Concerns;Current Anxiety/Panic;Current Depression Current Stress Concerns;Current Anxiety/Panic;Current Depression Current Stress Concerns;Current Anxiety/Panic;Current Depression     Comments Karen Cameron says she is still dealing with having a major coronary event. Will discuss PHQ9 in the upcoming future, Karen Cameron says she is still dealing with having a  major coronary event. Discussed PHQ9 on 09/28/23 and score has improved from a 6 to a 4. Discussed PHQ9 on 10/31/23 and score has improved to a 0.  No additional psychosocial concerns/stressors mentioned during exercise at [facility].     Expected Outcomes Karen Cameron will have controlled or decreased anxiety/ stressors upon completion of cardiac rehab Karen Cameron will have controlled or decreased anxiety/ stressors upon completion of cardiac rehab Karen Cameron will have controlled or decreased anxiety/ stressors upon completion of cardiac rehab     Interventions Stress management education;Relaxation education;Encouraged to attend Cardiac Rehabilitation for the exercise Stress management education;Relaxation education;Encouraged to attend Cardiac Rehabilitation for the exercise Stress management education;Relaxation education;Encouraged to attend Cardiac Rehabilitation for the exercise     Continue Psychosocial Services  Follow up required by staff Follow up required by staff Follow up required by staff       Initial Review   Source of Stress Concerns Chronic Illness -- --     Comments Will continue to monitor and offer support as needed. -- --        Psychosocial Discharge (Final Psychosocial Re-Evaluation):  Psychosocial Re-Evaluation - 11/02/23 1101       Psychosocial Re-Evaluation   Current issues with Current Stress Concerns;Current Anxiety/Panic;Current Depression    Comments Discussed PHQ9 on 10/31/23 and score has improved to a 0.  No additional psychosocial concerns/stressors mentioned during exercise at CR.    Expected Outcomes Karen Cameron will have controlled or decreased anxiety/ stressors upon completion of cardiac rehab    Interventions Stress management education;Relaxation education;Encouraged to attend Cardiac Rehabilitation for the exercise    Continue Psychosocial Services  Follow up required by staff          Vocational Rehabilitation: Provide vocational rehab assistance to qualifying  candidates.   Vocational Rehab Evaluation & Intervention:  Vocational Rehab - 09/18/23 1103       Initial Vocational Rehab Evaluation & Intervention   Assessment shows need for Vocational Rehabilitation No   Pt currently employed         Education: Education Goals: Education classes will be provided on a weekly basis, covering required topics. Participant will state understanding/return demonstration of topics presented.    Education     Row Name 09/24/23 1300     Education   Cardiac Education Topics Pritikin   Psychologist, Forensic Exercise Education   Exercise Education Improving Performance   Instruction Review Code 1- Verbalizes Understanding   Class Start Time 1400   Class Stop Time 1438   Class Time Calculation (min) 38 min    Row Name 09/26/23 1300     Education   Cardiac Education Topics Pritikin   Customer Service Manager   Weekly Topic International Cuisine- Spotlight on the Blue Zones   Instruction Review Code 1- Verbalizes Understanding   Class Start Time 1358   Class Stop  Time 1435   Class Time Calculation (min) 37 min    Row Name 09/28/23 1200     Education   Cardiac Education Topics Pritikin   Glass Blower/designer Nutrition   Nutrition Workshop Fueling a Forensic Psychologist   Instruction Review Code 1- Tefl Teacher Understanding   Class Start Time 1157   Class Stop Time 1230   Class Time Calculation (min) 33 min    Row Name 10/01/23 1300     Education   Cardiac Education Topics Pritikin   Geographical Information Systems Officer Psychosocial   Psychosocial Workshop Healthy Sleep for a Healthy Heart   Instruction Review Code 1- Verbalizes Understanding   Class Start Time 1400   Class Stop Time 1445   Class Time Calculation (min) 45 min    Row Name 10/03/23 1300      Education   Cardiac Education Topics Pritikin   Customer Service Manager   Weekly Topic Simple Sides and Sauces   Instruction Review Code 1- Verbalizes Understanding   Class Start Time 1400   Class Stop Time 1435   Class Time Calculation (min) 35 min    Row Name 10/08/23 1300     Education   Cardiac Education Topics Pritikin   Hospital Doctor Education   General Education Heart Disease Risk Reduction   Instruction Review Code 1- Verbalizes Understanding   Class Start Time 1410   Class Stop Time 1450   Class Time Calculation (min) 40 min    Row Name 10/10/23 1300     Education   Cardiac Education Topics Pritikin   Customer Service Manager   Weekly Topic Powerhouse Plant-Based Proteins   Instruction Review Code 1- Verbalizes Understanding   Class Start Time 1400   Class Stop Time 1436   Class Time Calculation (min) 36 min    Row Name 10/15/23 1300     Education   Cardiac Education Topics Pritikin   Geographical Information Systems Officer Psychosocial   Psychosocial Workshop From Head to Heart: The Power of a Healthy Outlook   Instruction Review Code 1- Verbalizes Understanding   Class Start Time 1400   Class Stop Time 1446   Class Time Calculation (min) 46 min    Row Name 10/17/23 1300     Education   Cardiac Education Topics Pritikin   Customer Service Manager   Weekly Topic Tasty Appetizers and Snacks   Instruction Review Code 1- Verbalizes Understanding   Class Start Time 1400   Class Stop Time 1442   Class Time Calculation (min) 42 min    Row Name 10/19/23 1200     Education   Cardiac Education Topics Pritikin   [employer] Workshops     Workshops   Educator Exercise Physiologist   Select Exercise   Exercise Workshop Managing Heart  Disease: Your Path to a Healthier Heart   Instruction Review Code 1- Verbalizes Understanding   Class Start Time 1405   Class Stop Time 1453   Class Time Calculation (min) 48 min    Row Name 10/22/23 1300  Education   Cardiac Education Topics Pritikin   Writer Psychosocial   Psychosocial Healthy Minds, Bodies, Hearts   Instruction Review Code 1- Verbalizes Understanding   Class Start Time 1400   Class Stop Time 1434   Class Time Calculation (min) 34 min    Row Name 10/24/23 1200     Education   Cardiac Education Topics Pritikin   Secondary School Teacher School   Educator Respiratory Therapist;Nurse   Weekly Topic Adding Flavor - Sodium-Free   Instruction Review Code 1- Verbalizes Understanding   Class Start Time 1357   Class Stop Time 1432   Class Time Calculation (min) 35 min    Row Name 10/26/23 1200     Education   Cardiac Education Topics Pritikin   Glass Blower/designer Nutrition   Nutrition Workshop Label Reading   Instruction Review Code 1- Verbalizes Understanding   Class Start Time 1145   Class Stop Time 1220   Class Time Calculation (min) 35 min    Row Name 10/29/23 1200     Education   Cardiac Education Topics Pritikin   Geographical Information Systems Officer Exercise   Exercise Workshop Location Manager and Fall Prevention   Instruction Review Code 1- Verbalizes Understanding   Class Start Time 1408   Class Stop Time 1500   Class Time Calculation (min) 52 min    Row Name 10/31/23 1300     Education   Cardiac Education Topics Pritikin   Customer Service Manager   Weekly Topic Fast and Healthy Breakfasts   Instruction Review Code 1- Verbalizes Understanding   Class Start Time 1400   Class Stop Time 1442   Class Time Calculation (min) 42 min    Row Name  11/02/23 1300     Education   Cardiac Education Topics Pritikin   Licensed Conveyancer Nutrition   Nutrition Other   Instruction Review Code 1- Verbalizes Understanding   Class Start Time 1145   Class Stop Time 1230   Class Time Calculation (min) 45 min    Row Name 11/05/23 1400     Education   Cardiac Education Topics Pritikin   Hospital Doctor Education   General Education Metabolic Syndrome and Belly Fat   Instruction Review Code 1- Verbalizes Understanding   Class Start Time 1407   Class Stop Time 1445   Class Time Calculation (min) 38 min      Core Videos: Exercise    Move It!  Clinical staff conducted group or individual video education with verbal and written material and guidebook.  Patient learns the recommended Pritikin exercise program. Exercise with the goal of living a long, healthy life. Some of the health benefits of exercise include controlled diabetes, healthier blood pressure levels, improved cholesterol levels, improved heart and lung capacity, improved sleep, and better body composition. Everyone should speak with their doctor before starting or changing an exercise routine.  Biomechanical Limitations Clinical staff conducted group or individual video education with verbal and written material and guidebook.  Patient learns how biomechanical limitations  can impact exercise and how we can mitigate and possibly overcome limitations to have an impactful and balanced exercise routine.  Body Composition Clinical staff conducted group or individual video education with verbal and written material and guidebook.  Patient learns that body composition (ratio of muscle mass to fat mass) is a key component to assessing overall fitness, rather than body weight alone. Increased fat mass, especially visceral belly fat, can put us  at increased risk for  metabolic syndrome, type 2 diabetes, heart disease, and even death. It is recommended to combine diet and exercise (cardiovascular and resistance training) to improve your body composition. Seek guidance from your physician and exercise physiologist before implementing an exercise routine.  Exercise Action Plan Clinical staff conducted group or individual video education with verbal and written material and guidebook.  Patient learns the recommended strategies to achieve and enjoy long-term exercise adherence, including variety, self-motivation, self-efficacy, and positive decision making. Benefits of exercise include fitness, good health, weight management, more energy, better sleep, less stress, and overall well-being.  Medical   Heart Disease Risk Reduction Clinical staff conducted group or individual video education with verbal and written material and guidebook.  Patient learns our heart is our most vital organ as it circulates oxygen, nutrients, white blood cells, and hormones throughout the entire body, and carries waste away. Data supports a plant-based eating plan like the Pritikin Program for its effectiveness in slowing progression of and reversing heart disease. The video provides a number of recommendations to address heart disease.   Metabolic Syndrome and Belly Fat  Clinical staff conducted group or individual video education with verbal and written material and guidebook.  Patient learns what metabolic syndrome is, how it leads to heart disease, and how one can reverse it and keep it from coming back. You have metabolic syndrome if you have 3 of the following 5 criteria: abdominal obesity, high blood pressure, high triglycerides, low HDL cholesterol, and high blood sugar.  Hypertension and Heart Disease Clinical staff conducted group or individual video education with verbal and written material and guidebook.  Patient learns that high blood pressure, or hypertension, is very common  in the [place] . Hypertension is largely due to excessive salt intake, but other important risk factors include being overweight, physical inactivity, drinking too much alcohol, smoking, and not eating enough potassium from fruits and vegetables. High blood pressure is a leading risk factor for heart attack, stroke, congestive heart failure, dementia, kidney failure, and premature death. Long-term effects of excessive salt intake include stiffening of the arteries and thickening of heart muscle and organ damage. Recommendations include ways to reduce hypertension and the risk of heart disease.  Diseases of Our Time - Focusing on Diabetes Clinical staff conducted group or individual video education with verbal and written material and guidebook.  Patient learns why the best way to stop diseases of our time is prevention, through food and other lifestyle changes. Medicine (such as prescription pills and surgeries) is often only a Band-Aid on the problem, not a long-term solution. Most common diseases of our time include obesity, type 2 diabetes, hypertension, heart disease, and cancer. The Pritikin Program is recommended and has been proven to help reduce, reverse, and/or prevent the damaging effects of metabolic syndrome.  Nutrition   Overview of the Pritikin Eating Plan  Clinical staff conducted group or individual video education with verbal and written material and guidebook.  Patient learns about the Pritikin Eating Plan for disease risk reduction. The Pritikin Eating Plan emphasizes  a wide variety of unrefined, minimally-processed carbohydrates, like fruits, vegetables, whole grains, and legumes. Go, Caution, and Stop food choices are explained. Plant-based and lean animal proteins are emphasized. Rationale provided for low sodium intake for blood pressure control, low added sugars for blood sugar stabilization, and low added fats and oils for coronary artery disease risk reduction and weight  management.  Calorie Density  Clinical staff conducted group or individual video education with verbal and written material and guidebook.  Patient learns about calorie density and how it impacts the Pritikin Eating Plan. Knowing the characteristics of the food you choose will help you decide whether those foods will lead to weight gain or weight loss, and whether you want to consume more or less of them. Weight loss is usually a side effect of the Pritikin Eating Plan because of its focus on low calorie-dense foods.  Label Reading  Clinical staff conducted group or individual video education with verbal and written material and guidebook.  Patient learns about the Pritikin recommended label reading guidelines and corresponding recommendations regarding calorie density, added sugars, sodium content, and whole grains.  Dining Out - Part 1  Clinical staff conducted group or individual video education with verbal and written material and guidebook.  Patient learns that restaurant meals can be sabotaging because they can be so high in calories, fat, sodium, and/or sugar. Patient learns recommended strategies on how to positively address this and avoid unhealthy pitfalls.  Facts on Fats  Clinical staff conducted group or individual video education with verbal and written material and guidebook.  Patient learns that lifestyle modifications can be just as effective, if not more so, as many medications for lowering your risk of heart disease. A Pritikin lifestyle can help to reduce your risk of inflammation and atherosclerosis (cholesterol build-up, or plaque, in the artery walls). Lifestyle interventions such as dietary choices and physical activity address the cause of atherosclerosis. A review of the types of fats and their impact on blood cholesterol levels, along with dietary recommendations to reduce fat intake is also included.  Nutrition Action Plan  Clinical staff conducted group or individual  video education with verbal and written material and guidebook.  Patient learns how to incorporate Pritikin recommendations into their lifestyle. Recommendations include planning and keeping personal health goals in mind as an important part of their success.  Healthy Mind-Set    Healthy Minds, Bodies, Hearts  Clinical staff conducted group or individual video education with verbal and written material and guidebook.  Patient learns how to identify when they are stressed. Video will discuss the impact of that stress, as well as the many benefits of stress management. Patient will also be introduced to stress management techniques. The way we think, act, and feel has an impact on our hearts.  How Our Thoughts Can Heal Our Hearts  Clinical staff conducted group or individual video education with verbal and written material and guidebook.  Patient learns that negative thoughts can cause depression and anxiety. This can result in negative lifestyle behavior and serious health problems. Cognitive behavioral therapy is an effective method to help control our thoughts in order to change and improve our emotional outlook.  Additional Videos:  Exercise    Improving Performance  Clinical staff conducted group or individual video education with verbal and written material and guidebook.  Patient learns to use a non-linear approach by alternating intensity levels and lengths of time spent exercising to help burn more calories and lose more body fat. Cardiovascular exercise  helps improve heart health, metabolism, hormonal balance, blood sugar control, and recovery from fatigue. Resistance training improves strength, endurance, balance, coordination, reaction time, metabolism, and muscle mass. Flexibility exercise improves circulation, posture, and balance. Seek guidance from your physician and exercise physiologist before implementing an exercise routine and learn your capabilities and proper form for all  exercise.  Introduction to Yoga  Clinical staff conducted group or individual video education with verbal and written material and guidebook.  Patient learns about yoga, a discipline of the coming together of mind, breath, and body. The benefits of yoga include improved flexibility, improved range of motion, better posture and core strength, increased lung function, weight loss, and positive self-image. Yoga's heart health benefits include lowered blood pressure, healthier heart rate, decreased cholesterol and triglyceride levels, improved immune function, and reduced stress. Seek guidance from your physician and exercise physiologist before implementing an exercise routine and learn your capabilities and proper form for all exercise.  Medical   Aging: Enhancing Your Quality of Life  Clinical staff conducted group or individual video education with verbal and written material and guidebook.  Patient learns key strategies and recommendations to stay in good physical health and enhance quality of life, such as prevention strategies, having an advocate, securing a Health Care Proxy and Power of Attorney, and keeping a list of medications and system for tracking them. It also discusses how to avoid risk for bone loss.  Biology of Weight Control  Clinical staff conducted group or individual video education with verbal and written material and guidebook.  Patient learns that weight gain occurs because we consume more calories than we burn (eating more, moving less). Even if your body weight is normal, you may have higher ratios of fat compared to muscle mass. Too much body fat puts you at increased risk for cardiovascular disease, heart attack, stroke, type 2 diabetes, and obesity-related cancers. In addition to exercise, following the Pritikin Eating Plan can help reduce your risk.  Decoding Lab Results  Clinical staff conducted group or individual video education with verbal and written material and  guidebook.  Patient learns that lab test reflects one measurement whose values change over time and are influenced by many factors, including medication, stress, sleep, exercise, food, hydration, pre-existing medical conditions, and more. It is recommended to use the knowledge from this video to become more involved with your lab results and evaluate your numbers to speak with your doctor.   Diseases of Our Time - Overview  Clinical staff conducted group or individual video education with verbal and written material and guidebook.  Patient learns that according to the CDC, 50% to 70% of chronic diseases (such as obesity, type 2 diabetes, elevated lipids, hypertension, and heart disease) are avoidable through lifestyle improvements including healthier food choices, listening to satiety cues, and increased physical activity.  Sleep Disorders Clinical staff conducted group or individual video education with verbal and written material and guidebook.  Patient learns how good quality and duration of sleep are important to overall health and well-being. Patient also learns about sleep disorders and how they impact health along with recommendations to address them, including discussing with a physician.  Nutrition  Dining Out - Part 2 Clinical staff conducted group or individual video education with verbal and written material and guidebook.  Patient learns how to plan ahead and communicate in order to maximize their dining experience in a healthy and nutritious manner. Included are recommended food choices based on the type of restaurant the patient is  visiting.   Fueling a Banker conducted group or individual video education with verbal and written material and guidebook.  There is a strong connection between our food choices and our health. Diseases like obesity and type 2 diabetes are very prevalent and are in large-part due to lifestyle choices. The Pritikin Eating Plan  provides plenty of food and hunger-curbing satisfaction. It is easy to follow, affordable, and helps reduce health risks.  Menu Workshop  Clinical staff conducted group or individual video education with verbal and written material and guidebook.  Patient learns that restaurant meals can sabotage health goals because they are often packed with calories, fat, sodium, and sugar. Recommendations include strategies to plan ahead and to communicate with the manager, chef, or server to help order a healthier meal.  Planning Your Eating Strategy  Clinical staff conducted group or individual video education with verbal and written material and guidebook.  Patient learns about the Pritikin Eating Plan and its benefit of reducing the risk of disease. The Pritikin Eating Plan does not focus on calories. Instead, it emphasizes high-quality, nutrient-rich foods. By knowing the characteristics of the foods, we choose, we can determine their calorie density and make informed decisions.  Targeting Your Nutrition Priorities  Clinical staff conducted group or individual video education with verbal and written material and guidebook.  Patient learns that lifestyle habits have a tremendous impact on disease risk and progression. This video provides eating and physical activity recommendations based on your personal health goals, such as reducing LDL cholesterol, losing weight, preventing or controlling type 2 diabetes, and reducing high blood pressure.  Vitamins and Minerals  Clinical staff conducted group or individual video education with verbal and written material and guidebook.  Patient learns different ways to obtain key vitamins and minerals, including through a recommended healthy diet. It is important to discuss all supplements you take with your doctor.   Healthy Mind-Set    Smoking Cessation  Clinical staff conducted group or individual video education with verbal and written material and guidebook.   Patient learns that cigarette smoking and tobacco addiction pose a serious health risk which affects millions of people. Stopping smoking will significantly reduce the risk of heart disease, lung disease, and many forms of cancer. Recommended strategies for quitting are covered, including working with your doctor to develop a successful plan.  Culinary   Becoming a Set Designer conducted group or individual video education with verbal and written material and guidebook.  Patient learns that cooking at home can be healthy, cost-effective, quick, and puts them in control. Keys to cooking healthy recipes will include looking at your recipe, assessing your equipment needs, planning ahead, making it simple, choosing cost-effective seasonal ingredients, and limiting the use of added fats, salts, and sugars.  Cooking - Breakfast and Snacks  Clinical staff conducted group or individual video education with verbal and written material and guidebook.  Patient learns how important breakfast is to satiety and nutrition through the entire day. Recommendations include key foods to eat during breakfast to help stabilize blood sugar levels and to prevent overeating at meals later in the day. Planning ahead is also a key component.  Cooking - Educational Psychologist conducted group or individual video education with verbal and written material and guidebook.  Patient learns eating strategies to improve overall health, including an approach to cook more at home. Recommendations include thinking of animal protein as a side on your plate rather  than center stage and focusing instead on lower calorie dense options like vegetables, fruits, whole grains, and plant-based proteins, such as beans. Making sauces in large quantities to freeze for later and leaving the skin on your vegetables are also recommended to maximize your experience.  Cooking - Healthy Salads and Dressing Clinical staff  conducted group or individual video education with verbal and written material and guidebook.  Patient learns that vegetables, fruits, whole grains, and legumes are the foundations of the Pritikin Eating Plan. Recommendations include how to incorporate each of these in flavorful and healthy salads, and how to create homemade salad dressings. Proper handling of ingredients is also covered. Cooking - Soups and [employer] - Soups and Desserts Clinical staff conducted group or individual video education with verbal and written material and guidebook.  Patient learns that Pritikin soups and desserts make for easy, nutritious, and delicious snacks and meal components that are low in sodium, fat, sugar, and calorie density, while high in vitamins, minerals, and filling fiber. Recommendations include simple and healthy ideas for soups and desserts.   Overview     The Pritikin Solution Program Overview Clinical staff conducted group or individual video education with verbal and written material and guidebook.  Patient learns that the results of the Pritikin Program have been documented in more than 100 articles published in peer-reviewed journals, and the benefits include reducing risk factors for (and, in some cases, even reversing) high cholesterol, high blood pressure, type 2 diabetes, obesity, and more! An overview of the three key pillars of the Pritikin Program will be covered: eating well, doing regular exercise, and having a healthy mind-set.  WORKSHOPS  Exercise: Exercise Basics: Building Your Action Plan Clinical staff led group instruction and group discussion with PowerPoint presentation and patient guidebook. To enhance the learning environment the use of posters, models and videos may be added. At the conclusion of this workshop, patients will comprehend the difference between physical activity and exercise, as well as the benefits of incorporating both, into their routine. Patients will  understand the FITT (Frequency, Intensity, Time, and Type) principle and how to use it to build an exercise action plan. In addition, safety concerns and other considerations for exercise and cardiac rehab will be addressed by the presenter. The purpose of this lesson is to promote a comprehensive and effective weekly exercise routine in order to improve patients' overall level of fitness.   Managing Heart Disease: Your Path to a Healthier Heart Clinical staff led group instruction and group discussion with PowerPoint presentation and patient guidebook. To enhance the learning environment the use of posters, models and videos may be added.At the conclusion of this workshop, patients will understand the anatomy and physiology of the heart. Additionally, they will understand how Pritikin's three pillars impact the risk factors, the progression, and the management of heart disease.  The purpose of this lesson is to provide a high-level overview of the heart, heart disease, and how the Pritikin lifestyle positively impacts risk factors.  Exercise Biomechanics Clinical staff led group instruction and group discussion with PowerPoint presentation and patient guidebook. To enhance the learning environment the use of posters, models and videos may be added. Patients will learn how the structural parts of their bodies function and how these functions impact their daily activities, movement, and exercise. Patients will learn how to promote a neutral spine, learn how to manage pain, and identify ways to improve their physical movement in order to promote healthy living. The purpose of  this lesson is to expose patients to common physical limitations that impact physical activity. Participants will learn practical ways to adapt and manage aches and pains, and to minimize their effect on regular exercise. Patients will learn how to maintain good posture while sitting, walking, and lifting.  Balance Training  and Fall Prevention  Clinical staff led group instruction and group discussion with PowerPoint presentation and patient guidebook. To enhance the learning environment the use of posters, models and videos may be added. At the conclusion of this workshop, patients will understand the importance of their sensorimotor skills (vision, proprioception, and the vestibular system) in maintaining their ability to balance as they age. Patients will apply a variety of balancing exercises that are appropriate for their current level of function. Patients will understand the common causes for poor balance, possible solutions to these problems, and ways to modify their physical environment in order to minimize their fall risk. The purpose of this lesson is to teach patients about the importance of maintaining balance as they age and ways to minimize their risk of falling.  WORKSHOPS   Nutrition:  Fueling a Ship Broker led group instruction and group discussion with PowerPoint presentation and patient guidebook. To enhance the learning environment the use of posters, models and videos may be added. Patients will review the foundational principles of the Pritikin Eating Plan and understand what constitutes a serving size in each of the food groups. Patients will also learn Pritikin-friendly foods that are better choices when away from home and review make-ahead meal and snack options. Calorie density will be reviewed and applied to three nutrition priorities: weight maintenance, weight loss, and weight gain. The purpose of this lesson is to reinforce (in a group setting) the key concepts around what patients are recommended to eat and how to apply these guidelines when away from home by planning and selecting Pritikin-friendly options. Patients will understand how calorie density may be adjusted for different weight management goals.  Mindful Eating  Clinical staff led group instruction and group  discussion with PowerPoint presentation and patient guidebook. To enhance the learning environment the use of posters, models and videos may be added. Patients will briefly review the concepts of the Pritikin Eating Plan and the importance of low-calorie dense foods. The concept of mindful eating will be introduced as well as the importance of paying attention to internal hunger signals. Triggers for non-hunger eating and techniques for dealing with triggers will be explored. The purpose of this lesson is to provide patients with the opportunity to review the basic principles of the Pritikin Eating Plan, discuss the value of eating mindfully and how to measure internal cues of hunger and fullness using the Hunger Scale. Patients will also discuss reasons for non-hunger eating and learn strategies to use for controlling emotional eating.  Targeting Your Nutrition Priorities Clinical staff led group instruction and group discussion with PowerPoint presentation and patient guidebook. To enhance the learning environment the use of posters, models and videos may be added. Patients will learn how to determine their genetic susceptibility to disease by reviewing their family history. Patients will gain insight into the importance of diet as part of an overall healthy lifestyle in mitigating the impact of genetics and other environmental insults. The purpose of this lesson is to provide patients with the opportunity to assess their personal nutrition priorities by looking at their family history, their own health history and current risk factors. Patients will also be able to discuss ways of  prioritizing and modifying the Pritikin Eating Plan for their highest risk areas  Menu  Clinical staff led group instruction and group discussion with PowerPoint presentation and patient guidebook. To enhance the learning environment the use of posters, models and videos may be added. Using menus brought in from [employer],  or printed from [employer], patients will apply the Pritikin dining out guidelines that were presented in the [employer] video. Patients will also be able to practice these guidelines in a variety of provided scenarios. The purpose of this lesson is to provide patients with the opportunity to practice hands-on learning of the Pritikin Dining Out guidelines with actual menus and practice scenarios.  Label Reading Clinical staff led group instruction and group discussion with PowerPoint presentation and patient guidebook. To enhance the learning environment the use of posters, models and videos may be added. Patients will review and discuss the Pritikin label reading guidelines presented in Pritikin's Label Reading Educational series video. Using fool labels brought in from local grocery stores and markets, patients will apply the label reading guidelines and determine if the packaged food meet the Pritikin guidelines. The purpose of this lesson is to provide patients with the opportunity to review, discuss, and practice hands-on learning of the Pritikin Label Reading guidelines with actual packaged food labels. Cooking School  Pritikin's [employer] are designed to teach patients ways to prepare quick, simple, and affordable recipes at home. The importance of nutrition's role in chronic disease risk reduction is reflected in its emphasis in the overall Pritikin program. By learning how to prepare essential core Pritikin Eating Plan recipes, patients will increase control over what they eat; be able to customize the flavor of foods without the use of added salt, sugar, or fat; and improve the quality of the food they consume. By learning a set of core recipes which are easily assembled, quickly prepared, and affordable, patients are more likely to prepare more healthy foods at home. These workshops focus on convenient breakfasts, simple entres, side dishes, and desserts which  can be prepared with minimal effort and are consistent with nutrition recommendations for cardiovascular risk reduction. Cooking [employer] are taught by a armed forces logistics/support/administrative officer (RD) who has been trained by the [employer]. The chef or RD has a clear understanding of the importance of minimizing - if not completely eliminating - added fat, sugar, and sodium in recipes. Throughout the series of Cooking School Workshop sessions, patients will learn about healthy ingredients and efficient methods of cooking to build confidence in their capability to prepare    Cooking School weekly topics:  Adding Flavor- Sodium-Free  Fast and Healthy Breakfasts  Powerhouse Plant-Based Proteins  Satisfying Salads and Dressings  Simple Sides and Sauces  International Cuisine-Spotlight on the [employer] Zones  Delicious Desserts  Savory Soups  [employer] - Meals in a Astronomer Appetizers and Snacks  Comforting Weekend Breakfasts  One-Pot Wonders   Fast Evening Meals  Landscape Architect Your Pritikin Plate  WORKSHOPS   Healthy Mindset (Psychosocial):  Focused Goals, Sustainable Changes Clinical staff led group instruction and group discussion with PowerPoint presentation and patient guidebook. To enhance the learning environment the use of posters, models and videos may be added. Patients will be able to apply effective goal setting strategies to establish at least one personal goal, and then take consistent, meaningful action toward that goal. They will learn to identify common barriers to achieving personal goals and develop  strategies to overcome them. Patients will also gain an understanding of how our mind-set can impact our ability to achieve goals and the importance of cultivating a positive and growth-oriented mind-set. The purpose of this lesson is to provide patients with a deeper understanding of how to set and achieve personal goals, as well as the tools  and strategies needed to overcome common obstacles which may arise along the way.  From Head to Heart: The Power of a Healthy Outlook  Clinical staff led group instruction and group discussion with PowerPoint presentation and patient guidebook. To enhance the learning environment the use of posters, models and videos may be added. Patients will be able to recognize and describe the impact of emotions and mood on physical health. They will discover the importance of self-care and explore self-care practices which may work for them. Patients will also learn how to utilize the 4 C's to cultivate a healthier outlook and better manage stress and challenges. The purpose of this lesson is to demonstrate to patients how a healthy outlook is an essential part of maintaining good health, especially as they continue their cardiac rehab journey.  Healthy Sleep for a Healthy Heart Clinical staff led group instruction and group discussion with PowerPoint presentation and patient guidebook. To enhance the learning environment the use of posters, models and videos may be added. At the conclusion of this workshop, patients will be able to demonstrate knowledge of the importance of sleep to overall health, well-being, and quality of life. They will understand the symptoms of, and treatments for, common sleep disorders. Patients will also be able to identify daytime and nighttime behaviors which impact sleep, and they will be able to apply these tools to help manage sleep-related challenges. The purpose of this lesson is to provide patients with a general overview of sleep and outline the importance of quality sleep. Patients will learn about a few of the most common sleep disorders. Patients will also be introduced to the concept of "sleep hygiene," and discover ways to self-manage certain sleeping problems through simple daily behavior changes. Finally, the workshop will motivate patients by clarifying the links between  quality sleep and their goals of heart-healthy living.   Recognizing and Reducing Stress Clinical staff led group instruction and group discussion with PowerPoint presentation and patient guidebook. To enhance the learning environment the use of posters, models and videos may be added. At the conclusion of this workshop, patients will be able to understand the types of stress reactions, differentiate between acute and chronic stress, and recognize the impact that chronic stress has on their health. They will also be able to apply different coping mechanisms, such as reframing negative self-talk. Patients will have the opportunity to practice a variety of stress management techniques, such as deep abdominal breathing, progressive muscle relaxation, and/or guided imagery.  The purpose of this lesson is to educate patients on the role of stress in their lives and to provide healthy techniques for coping with it.  Learning Barriers/Preferences:  Learning Barriers/Preferences - 09/18/23 1102       Learning Barriers/Preferences   Learning Barriers None    Learning Preferences Verbal Instruction;Written Material;Skilled Demonstration          Education Topics:  Knowledge Questionnaire Score:  Knowledge Questionnaire Score - 09/18/23 1030       Knowledge Questionnaire Score   Pre Score 24/24          Core Components/Risk Factors/Patient Goals at Admission:  Personal Goals and Risk Factors at  Admission - 09/18/23 0859       Core Components/Risk Factors/Patient Goals on Admission    Weight Management Yes;Weight Maintenance    Intervention Weight Management: Develop a combined nutrition and exercise program designed to reach desired caloric intake, while maintaining appropriate intake of nutrient and fiber, sodium and fats, and appropriate energy expenditure required for the weight goal.;Weight Management: Provide education and appropriate resources to help participant work on and attain  dietary goals.    Admit Weight 129 lb 13.6 oz (58.9 kg)    Expected Outcomes Weight Maintenance: Understanding of the daily nutrition guidelines, which includes 25-35% calories from fat, 7% or less cal from saturated fats, less than 200mg  cholesterol, less than 1.5gm of sodium, & 5 or more servings of fruits and vegetables daily;Understanding recommendations for meals to include 15-35% energy as protein, 25-35% energy from fat, 35-60% energy from carbohydrates, less than 200mg  of dietary cholesterol, 20-35 gm of total fiber daily;Understanding of distribution of calorie intake throughout the day with the consumption of 4-5 meals/snacks    Heart Failure Yes    Intervention Provide a combined exercise and nutrition program that is supplemented with education, support and counseling about heart failure. Directed toward relieving symptoms such as shortness of breath, decreased exercise tolerance, and extremity edema.    Expected Outcomes Short term: Attendance in program 2-3 days a week with increased exercise capacity. Reported lower sodium intake. Reported increased fruit and vegetable intake. Reports medication compliance.;Short term: Daily weights obtained and reported for increase. Utilizing diuretic protocols set by physician.;Long term: Adoption of self-care skills and reduction of barriers for early signs and symptoms recognition and intervention leading to self-care maintenance.;Improve functional capacity of life    Hypertension Yes    Intervention Provide education on lifestyle modifcations including regular physical activity/exercise, weight management, moderate sodium restriction and increased consumption of fresh fruit, vegetables, and low fat dairy, alcohol moderation, and smoking cessation.;Monitor prescription use compliance.    Expected Outcomes Short Term: Continued assessment and intervention until BP is < 140/73mm HG in hypertensive participants. < 130/76mm HG in hypertensive participants  with diabetes, heart failure or chronic kidney disease.;Long Term: Maintenance of blood pressure at goal levels.    Lipids Yes    Intervention Provide education and support for participant on nutrition & aerobic/resistive exercise along with prescribed medications to achieve LDL 70mg , HDL >40mg .    Expected Outcomes Short Term: Participant states understanding of desired cholesterol values and is compliant with medications prescribed. Participant is following exercise prescription and nutrition guidelines.;Long Term: Cholesterol controlled with medications as prescribed, with individualized exercise RX and with personalized nutrition plan. Value goals: LDL < 70mg , HDL > 40 mg.    Stress Yes    Intervention Offer individual and/or small group education and counseling on adjustment to heart disease, stress management and health-related lifestyle change. Teach and support self-help strategies.;Refer participants experiencing significant psychosocial distress to appropriate mental health specialists for further evaluation and treatment. When possible, include family members and significant others in education/counseling sessions.    Expected Outcomes Short Term: Participant demonstrates changes in health-related behavior, relaxation and other stress management skills, ability to obtain effective social support, and compliance with psychotropic medications if prescribed.;Long Term: Emotional wellbeing is indicated by absence of clinically significant psychosocial distress or social isolation.          Core Components/Risk Factors/Patient Goals Review:   Goals and Risk Factor Review     Row Name 09/18/23 0856 10/02/23 1624 11/02/23 1100  Core Components/Risk Factors/Patient Goals Review   Personal Goals Review -- Heart Failure;Hypertension;Lipids;Stress;Weight Management/Obesity Heart Failure;Hypertension;Lipids;Stress;Weight Management/Obesity     Review -- Karen Cameron is doing well with exercise  at cardiac rehab. VSS. Karen Cameron has begun to increase her treadmill met levels. Karen Cameron is doing well with exercise at cardiac rehab. VSS. Karen Cameron has increased her met levels over the last 30 days     Expected Outcomes -- Karen Cameron will continue to particpate in cardiac rehab for exercise nutriton and lifestyle modifications. Karen Cameron will continue to particpate in cardiac rehab for exercise nutriton and lifestyle modifications.        Core Components/Risk Factors/Patient Goals at Discharge (Final Review):   Goals and Risk Factor Review - 11/02/23 1100       Core Components/Risk Factors/Patient Goals Review   Personal Goals Review Heart Failure;Hypertension;Lipids;Stress;Weight Management/Obesity    Review Karen Cameron is doing well with exercise at cardiac rehab. VSS. Karen Cameron has increased her met levels over the last 30 days    Expected Outcomes Karen Cameron will continue to particpate in cardiac rehab for exercise nutriton and lifestyle modifications.          ITP Comments:  ITP Comments     Row Name 09/18/23 0757 09/25/23 0755 10/02/23 1617 11/02/23 1059     ITP Comments Wilbert Bihari, MD: Medical Director.  Introduction to the [facility] / Intensive Cardiac Rehab.  Intial orientation packet reviewed with the patient. 30 Day ITP Review. Karen Cameron started cardiac rehab on 09/25/23. Camille did well with exercise. 30 Day ITP Review. Karen Cameron is doing well with exercise at cardiac rehab. 30 Day ITP Review. Karen Cameron continues to do well with exercise at cardiac rehab.       Comments: see ITP comments

## 2023-11-07 ENCOUNTER — Encounter (HOSPITAL_COMMUNITY)
Admission: RE | Admit: 2023-11-07 | Discharge: 2023-11-07 | Disposition: A | Source: Ambulatory Visit | Attending: Cardiology

## 2023-11-07 DIAGNOSIS — Z955 Presence of coronary angioplasty implant and graft: Secondary | ICD-10-CM

## 2023-11-07 DIAGNOSIS — I2102 ST elevation (STEMI) myocardial infarction involving left anterior descending coronary artery: Secondary | ICD-10-CM | POA: Diagnosis not present

## 2023-11-09 ENCOUNTER — Encounter (HOSPITAL_COMMUNITY)
Admission: RE | Admit: 2023-11-09 | Discharge: 2023-11-09 | Disposition: A | Discharge status: Home / Self Care | Source: Ambulatory Visit | Attending: Cardiology | Admitting: Cardiology

## 2023-11-09 ENCOUNTER — Encounter (HOSPITAL_COMMUNITY)

## 2023-11-09 DIAGNOSIS — I2102 ST elevation (STEMI) myocardial infarction involving left anterior descending coronary artery: Secondary | ICD-10-CM | POA: Diagnosis not present

## 2023-11-09 DIAGNOSIS — Z955 Presence of coronary angioplasty implant and graft: Secondary | ICD-10-CM

## 2023-11-12 ENCOUNTER — Encounter (HOSPITAL_COMMUNITY)
Admission: RE | Admit: 2023-11-12 | Discharge: 2023-11-12 | Disposition: A | Discharge status: Home / Self Care | Source: Ambulatory Visit | Attending: Cardiology | Admitting: Cardiology

## 2023-11-12 DIAGNOSIS — I2102 ST elevation (STEMI) myocardial infarction involving left anterior descending coronary artery: Secondary | ICD-10-CM

## 2023-11-12 DIAGNOSIS — Z955 Presence of coronary angioplasty implant and graft: Secondary | ICD-10-CM

## 2023-11-14 ENCOUNTER — Encounter (HOSPITAL_COMMUNITY)
Admission: RE | Admit: 2023-11-14 | Discharge: 2023-11-14 | Disposition: A | Discharge status: Home / Self Care | Source: Ambulatory Visit | Attending: Cardiology | Admitting: Cardiology

## 2023-11-14 DIAGNOSIS — Z955 Presence of coronary angioplasty implant and graft: Secondary | ICD-10-CM

## 2023-11-14 DIAGNOSIS — I2102 ST elevation (STEMI) myocardial infarction involving left anterior descending coronary artery: Secondary | ICD-10-CM | POA: Diagnosis not present

## 2023-11-15 ENCOUNTER — Other Ambulatory Visit: Payer: Self-pay | Admitting: Medical Genetics

## 2023-11-16 ENCOUNTER — Encounter (HOSPITAL_COMMUNITY)
Admission: RE | Admit: 2023-11-16 | Discharge: 2023-11-16 | Disposition: A | Discharge status: Home / Self Care | Source: Ambulatory Visit | Attending: Cardiology | Admitting: Cardiology

## 2023-11-16 DIAGNOSIS — I2102 ST elevation (STEMI) myocardial infarction involving left anterior descending coronary artery: Secondary | ICD-10-CM

## 2023-11-16 DIAGNOSIS — Z955 Presence of coronary angioplasty implant and graft: Secondary | ICD-10-CM

## 2023-11-18 ENCOUNTER — Encounter (HOSPITAL_COMMUNITY): Payer: Self-pay

## 2023-11-19 ENCOUNTER — Encounter (HOSPITAL_COMMUNITY)
Admission: RE | Admit: 2023-11-19 | Discharge: 2023-11-19 | Disposition: A | Discharge status: Home / Self Care | Source: Ambulatory Visit | Attending: Cardiology | Admitting: Cardiology

## 2023-11-19 DIAGNOSIS — I2102 ST elevation (STEMI) myocardial infarction involving left anterior descending coronary artery: Secondary | ICD-10-CM | POA: Diagnosis not present

## 2023-11-19 DIAGNOSIS — Z955 Presence of coronary angioplasty implant and graft: Secondary | ICD-10-CM

## 2023-11-21 ENCOUNTER — Other Ambulatory Visit: Payer: Self-pay

## 2023-11-21 ENCOUNTER — Encounter: Payer: Self-pay | Admitting: Nurse Practitioner

## 2023-11-21 ENCOUNTER — Ambulatory Visit: Payer: Self-pay | Admitting: Nurse Practitioner

## 2023-11-21 ENCOUNTER — Encounter (HOSPITAL_COMMUNITY)
Admission: RE | Admit: 2023-11-21 | Discharge: 2023-11-21 | Disposition: A | Source: Ambulatory Visit | Attending: Cardiology

## 2023-11-21 ENCOUNTER — Other Ambulatory Visit (HOSPITAL_COMMUNITY): Payer: Self-pay

## 2023-11-21 VITALS — BP 100/60 | HR 84 | Temp 98.1°F | Ht 63.0 in | Wt 130.6 lb

## 2023-11-21 DIAGNOSIS — I10 Essential (primary) hypertension: Secondary | ICD-10-CM

## 2023-11-21 DIAGNOSIS — I252 Old myocardial infarction: Secondary | ICD-10-CM | POA: Diagnosis not present

## 2023-11-21 DIAGNOSIS — Z139 Encounter for screening, unspecified: Secondary | ICD-10-CM | POA: Diagnosis not present

## 2023-11-21 DIAGNOSIS — I2102 ST elevation (STEMI) myocardial infarction involving left anterior descending coronary artery: Secondary | ICD-10-CM

## 2023-11-21 DIAGNOSIS — E7800 Pure hypercholesterolemia, unspecified: Secondary | ICD-10-CM | POA: Diagnosis not present

## 2023-11-21 DIAGNOSIS — Z955 Presence of coronary angioplasty implant and graft: Secondary | ICD-10-CM

## 2023-11-21 NOTE — Progress Notes (Signed)
 LILLETTE Kristeen JINNY Gladis, CMA,acting as a neurosurgeon for Gaines Ada, FNP.,have documented all relevant documentation on the behalf of Gaines Ada, FNP,as directed by  Gaines Ada, FNP while in the presence of Gaines Ada, FNP.  Subjective:  Patient ID: Karen Cameron , female    DOB: 1965/02/02 , 58 y.o.   MRN: [id number]  Chief Complaint  Patient presents with   Hypertension    Patient presents today for a bp and chol follow up, Patient reports compliance with medication. Patient denies any chest pain, SOB, or headaches. Patient has no concerns today.     HPI  Discussed the use of AI scribe software for clinical note transcription with the patient, who gave verbal consent to proceed.  History of Present Illness Karen Cameron is a 58 year old female who presents for follow-up for her cholesterol and BP.   She is currently participating in cardiac rehabilitation and has been out of work since the heart attack. She hopes to return to work after her upcoming echocardiogram next month. She [facility] her [facility] at the senior center to stay busy and avoid unnecessary shopping trips.  She is compliant with her current medications. She received her flu shot and had her basic metabolic panel checked in September which showed a slightly declined kidney function. She is due for a cholesterol check and her kidney function will be re-evaluated as it was previously borderline.  Her mood is good with no significant changes since her last visit. Family history reveals heart issues among her mother's siblings and cousins, but not in her direct maternal line.  Past Medical History:  Diagnosis Date   Acute ST elevation myocardial infarction (STEMI) of anterolateral wall (HCC) 08/19/2023   Allergy     Anxiety    Depression    Encounter for annual health examination 05/21/2023   Hypertension    Nausea & vomiting    SAB (spontaneous abortion)    X2   SUI (stress urinary incontinence, female)       Family History  Problem Relation Age of Onset   Diabetes Mother    Cancer Father        HEAD,NECK, THROAT   Diabetes Maternal Aunt    Diabetes Maternal Uncle    Diabetes Paternal Grandmother    Allergic rhinitis Neg Hx    Asthma Neg Hx    Eczema Neg Hx    Urticaria Neg Hx      Current Outpatient Medications:    aspirin  81 MG chewable tablet, Chew 1 tablet (81 mg total) by mouth daily., Disp: 30 tablet, Rfl: 5   atorvastatin  (LIPITOR ) 80 MG tablet, Take 1 tablet (80 mg total) by mouth daily., Disp: 30 tablet, Rfl: 5   empagliflozin  (JARDIANCE ) 10 MG TABS tablet, Take 1 tablet (10 mg total) by mouth daily., Disp: 30 tablet, Rfl: 5   furosemide  (LASIX ) 20 MG tablet, Take Lasix  20 mg (1 tablet) as needed for weight gain > 3 lbs overnight or > 5 lbs in one week, increased shortness of breath, or increased swelling., Disp: 30 tablet, Rfl: 3   gabapentin  (NEURONTIN ) 300 MG capsule, Take 1 capsule (300 mg total) by mouth at bedtime., Disp: 30 capsule, Rfl: 5   ivabradine  (CORLANOR ) 5 MG TABS tablet, Take 1 tablet (5 mg total) by mouth 2 (two) times daily with a meal., Disp: 60 tablet, Rfl: 5   metoprolol  succinate (TOPROL -XL) 25 MG 24 hr tablet, Take 1 tablet (25 mg total) by mouth daily., Disp: 30  tablet, Rfl: 5   nitroGLYCERIN  (NITROSTAT ) 0.4 MG SL tablet, Place 1 tablet (0.4 mg total) under the tongue every 5 (five) minutes as needed for chest pain., Disp: 100 tablet, Rfl: 3   sacubitril -valsartan  (ENTRESTO ) 24-26 MG, Take 1 tablet by mouth 2 (two) times daily., Disp: 60 tablet, Rfl: 5   spironolactone  (ALDACTONE ) 25 MG tablet, Take 1 tablet (25 mg total) by mouth daily., Disp: 30 tablet, Rfl: 5   ticagrelor  (BRILINTA ) 90 MG TABS tablet, Take 1 tablet (90 mg total) by mouth 2 (two) times daily., Disp: 60 tablet, Rfl: 5   valACYclovir  (VALTREX ) 500 MG tablet, Take 1 tablet (500 mg total) by mouth 2 (two) times daily for 5 days as needed, Disp: 30 tablet, Rfl: 11   No Known Allergies    Review of Systems  Constitutional: Negative.   Respiratory: Negative.    Cardiovascular: Negative.   Gastrointestinal:  Negative for abdominal distention.  Neurological: Negative.   Psychiatric/Behavioral: Negative.       Today's Vitals   11/21/23 0828  BP: 100/60  Pulse: 84  Temp: 98.1 F (36.7 C)  TempSrc: Oral  Weight: 130 lb 9.6 oz (59.2 kg)  Height: 5' 3 (1.6 m)  PainSc: 0-No pain   Body mass index is 23.13 kg/m.  Wt Readings from Last 3 Encounters:  11/21/23 130 lb 9.6 oz (59.2 kg)  10/02/23 127 lb 12.8 oz (58 kg)  09/18/23 129 lb 13.6 oz (58.9 kg)     Objective:  Physical Exam Vitals and nursing note reviewed.  Constitutional:      General: She is not in acute distress.    Appearance: Normal appearance. She is well-developed.  HENT:     Head: Normocephalic and atraumatic.  Eyes:     Pupils: Pupils are equal, round, and reactive to light.  Cardiovascular:     Rate and Rhythm: Normal rate and regular rhythm.     Pulses: Normal pulses.     Heart sounds: Normal heart sounds. No murmur heard. Pulmonary:     Effort: Pulmonary effort is normal. No respiratory distress.     Breath sounds: Normal breath sounds. No wheezing.  Musculoskeletal:        General: Normal range of motion.  Skin:    General: Skin is warm and dry.     Capillary Refill: Capillary refill takes less than 2 seconds.  Neurological:     General: No focal deficit present.     Mental Status: She is alert and oriented to person, place, and time.     Cranial Nerves: No cranial nerve deficit.     Motor: No weakness.  Psychiatric:        Mood and Affect: Mood normal.         Assessment And Plan:   Assessment & Plan Essential hypertension Blood pressure is well-controlled at 100/60 mmHg. Current medication regimen is effective. - Continue current antihypertensive regimen. Elevated LDL cholesterol level Will recheck her lipids today, continue current medications History of MI  (myocardial infarction) Post-acute phase following myocardial infarction in August. Currently undergoing cardiac rehabilitation. - Continue cardiac rehabilitation. - Scheduled echocardiogram next month with Cardiology - Scheduled follow-up with cardiologist in December. - Scheduled follow-up appointment in three months. Encounter for screening Will check for hepatitis b immunity  Orders Placed This Encounter  Procedures   BMP8+eGFR   Lipid panel   Hepatitis B Surface Antibody     Return for keep same next; 3 month f/u.  Patient was given opportunity  to ask questions. Patient verbalized understanding of the plan and was able to repeat key elements of the plan. All questions were answered to their satisfaction.    LILLETTE Gaines Ada, FNP, have reviewed all documentation for this visit. The documentation on 11/21/23 for the exam, diagnosis, procedures, and orders are all accurate and complete.   IF YOU HAVE BEEN REFERRED TO A SPECIALIST, IT MAY TAKE 1-2 WEEKS TO SCHEDULE/PROCESS THE REFERRAL. IF YOU HAVE NOT HEARD FROM US /SPECIALIST IN TWO WEEKS, PLEASE GIVE US  A CALL AT [phone].

## 2023-11-21 NOTE — Assessment & Plan Note (Addendum)
 Will recheck her lipids today, continue current medications

## 2023-11-21 NOTE — Assessment & Plan Note (Addendum)
 Blood pressure is well-controlled at 100/60 mmHg. Current medication regimen is effective. - Continue current antihypertensive regimen.

## 2023-11-21 NOTE — Assessment & Plan Note (Addendum)
 Post-acute phase following myocardial infarction in August. Currently undergoing cardiac rehabilitation. - Continue cardiac rehabilitation. - Scheduled echocardiogram next month with Cardiology - Scheduled follow-up with cardiologist in December. - Scheduled follow-up appointment in three months.

## 2023-11-22 LAB — LIPID PANEL
Chol/HDL Ratio: 1.7 ratio (ref 0.0–4.4)
Cholesterol, Total: 104 mg/dL (ref 100–199)
HDL: 61 mg/dL (ref >39)
LDL Chol Calc (NIH): 31 mg/dL (ref 0–99)
Triglycerides: 45 mg/dL (ref 0–149)
VLDL Cholesterol Cal: 12 mg/dL (ref 5–40)

## 2023-11-22 LAB — HEPATITIS B SURFACE ANTIBODY,QUALITATIVE: Hep B Surface Ab, Qual: REACTIVE

## 2023-11-22 LAB — BMP8+EGFR
BUN/Creatinine Ratio: 11 (ref 9–23)
BUN: 14 mg/dL (ref 6–24)
CO2: 22 mmol/L (ref 20–29)
Calcium: 9.6 mg/dL (ref 8.7–10.2)
Chloride: 104 mmol/L (ref 96–106)
Creatinine, Ser: 1.22 mg/dL — ABNORMAL HIGH (ref 0.57–1.00)
Glucose: 104 mg/dL — ABNORMAL HIGH (ref 70–99)
Potassium: 4.4 mmol/L (ref 3.5–5.2)
Sodium: 140 mmol/L (ref 134–144)
eGFR: 52 mL/min/1.73 — ABNORMAL LOW (ref >59)

## 2023-11-23 ENCOUNTER — Encounter (HOSPITAL_COMMUNITY)
Admission: RE | Admit: 2023-11-23 | Discharge: 2023-11-23 | Disposition: A | Discharge status: Home / Self Care | Source: Ambulatory Visit | Attending: Cardiology | Admitting: Cardiology

## 2023-11-23 DIAGNOSIS — Z955 Presence of coronary angioplasty implant and graft: Secondary | ICD-10-CM

## 2023-11-23 DIAGNOSIS — I2102 ST elevation (STEMI) myocardial infarction involving left anterior descending coronary artery: Secondary | ICD-10-CM | POA: Diagnosis not present

## 2023-11-25 DIAGNOSIS — I213 ST elevation (STEMI) myocardial infarction of unspecified site: Secondary | ICD-10-CM | POA: Diagnosis not present

## 2023-11-26 ENCOUNTER — Encounter (HOSPITAL_COMMUNITY)
Admission: RE | Admit: 2023-11-26 | Discharge: 2023-11-26 | Disposition: A | Discharge status: Home / Self Care | Source: Ambulatory Visit | Attending: Cardiology | Admitting: Cardiology

## 2023-11-26 DIAGNOSIS — I2102 ST elevation (STEMI) myocardial infarction involving left anterior descending coronary artery: Secondary | ICD-10-CM

## 2023-11-26 DIAGNOSIS — Z955 Presence of coronary angioplasty implant and graft: Secondary | ICD-10-CM

## 2023-11-28 ENCOUNTER — Encounter (HOSPITAL_COMMUNITY)
Admission: RE | Admit: 2023-11-28 | Discharge: 2023-11-28 | Disposition: A | Discharge status: Home / Self Care | Source: Ambulatory Visit | Attending: Cardiology | Admitting: Cardiology

## 2023-11-28 DIAGNOSIS — Z955 Presence of coronary angioplasty implant and graft: Secondary | ICD-10-CM

## 2023-11-28 DIAGNOSIS — I2102 ST elevation (STEMI) myocardial infarction involving left anterior descending coronary artery: Secondary | ICD-10-CM | POA: Diagnosis not present

## 2023-11-30 ENCOUNTER — Encounter (HOSPITAL_COMMUNITY)

## 2023-12-01 ENCOUNTER — Other Ambulatory Visit (HOSPITAL_COMMUNITY): Payer: Self-pay

## 2023-12-03 ENCOUNTER — Encounter (HOSPITAL_COMMUNITY)
Admission: RE | Admit: 2023-12-03 | Discharge: 2023-12-03 | Disposition: A | Source: Ambulatory Visit | Attending: Cardiology

## 2023-12-03 DIAGNOSIS — Z955 Presence of coronary angioplasty implant and graft: Secondary | ICD-10-CM | POA: Insufficient documentation

## 2023-12-03 DIAGNOSIS — I2102 ST elevation (STEMI) myocardial infarction involving left anterior descending coronary artery: Secondary | ICD-10-CM | POA: Insufficient documentation

## 2023-12-04 NOTE — Progress Notes (Signed)
 Cardiac Individual Treatment Plan  Patient Details  Name: Karen Cameron MRN: [id number] Date of Birth: 03-17-1965 Referring Provider:   Flowsheet Row INTENSIVE CARDIAC REHAB ORIENT from 09/18/2023 in [facility]  Referring Provider Morene Brownie, MD    Initial Encounter Date:  Flowsheet Row INTENSIVE CARDIAC REHAB ORIENT from 09/18/2023 in [facility]  Date 09/18/23    Visit Diagnosis: 08/19/23 ST elevation myocardial infarction involving left anterior descending (LAD) coronary artery (HCC)  08/19/23 Status post coronary artery stent placement  Patient's Home Medications on Admission:  Current Outpatient Medications:    aspirin  81 MG chewable tablet, Chew 1 tablet (81 mg total) by mouth daily., Disp: 30 tablet, Rfl: 5   atorvastatin  (LIPITOR ) 80 MG tablet, Take 1 tablet (80 mg total) by mouth daily., Disp: 30 tablet, Rfl: 5   empagliflozin  (JARDIANCE ) 10 MG TABS tablet, Take 1 tablet (10 mg total) by mouth daily., Disp: 30 tablet, Rfl: 5   furosemide  (LASIX ) 20 MG tablet, Take Lasix  20 mg (1 tablet) as needed for weight gain > 3 lbs overnight or > 5 lbs in one week, increased shortness of breath, or increased swelling., Disp: 30 tablet, Rfl: 3   gabapentin  (NEURONTIN ) 300 MG capsule, Take 1 capsule (300 mg total) by mouth at bedtime., Disp: 30 capsule, Rfl: 5   ivabradine  (CORLANOR ) 5 MG TABS tablet, Take 1 tablet (5 mg total) by mouth 2 (two) times daily with a meal., Disp: 60 tablet, Rfl: 5   metoprolol  succinate (TOPROL -XL) 25 MG 24 hr tablet, Take 1 tablet (25 mg total) by mouth daily., Disp: 30 tablet, Rfl: 5   nitroGLYCERIN  (NITROSTAT ) 0.4 MG SL tablet, Place 1 tablet (0.4 mg total) under the tongue every 5 (five) minutes as needed for chest pain., Disp: 100 tablet, Rfl: 3   sacubitril -valsartan  (ENTRESTO ) 24-26 MG, Take 1 tablet by mouth 2 (two) times daily.,  Disp: 60 tablet, Rfl: 5   spironolactone  (ALDACTONE ) 25 MG tablet, Take 1 tablet (25 mg total) by mouth daily., Disp: 30 tablet, Rfl: 5   ticagrelor  (BRILINTA ) 90 MG TABS tablet, Take 1 tablet (90 mg total) by mouth 2 (two) times daily., Disp: 60 tablet, Rfl: 5   valACYclovir  (VALTREX ) 500 MG tablet, Take 1 tablet (500 mg total) by mouth 2 (two) times daily for 5 days as needed, Disp: 30 tablet, Rfl: 11  Past Medical History: Past Medical History:  Diagnosis Date   Acute ST elevation myocardial infarction (STEMI) of anterolateral wall (HCC) 08/19/2023   Allergy     Anxiety    Depression    Encounter for annual health examination 05/21/2023   Hypertension    Nausea & vomiting    SAB (spontaneous abortion)    X2   SUI (stress urinary incontinence, female)     Tobacco Use: Social History   Tobacco Use  Smoking Status Never  Smokeless Tobacco Never    Labs: Review Flowsheet  More data exists      Latest Ref Rng & Units 08/22/2023 08/23/2023 08/24/2023 08/25/2023 11/21/2023  Labs for ITP Cardiac and Pulmonary Rehab  Cholestrol 100 - 199 mg/dL - - - - 895   LDL (calc) 0 - 99 mg/dL - - - - 31   HDL-C >60 mg/dL - - - - 61   Trlycerides 0 - 149 mg/dL - - - - 45   O2 Saturation % 61.4  63.9  56.5  65.1  63.9  -    Details       Multiple values from one day are sorted in reverse-chronological order          Exercise Target Goals: Exercise Program Goal: Individual exercise prescription set using results from initial 6 min walk test and THRR while considering  patient's activity barriers and safety.   Exercise Prescription Goal: Initial exercise prescription builds to 30-45 minutes a day of aerobic activity, 2-3 days per week.  Home exercise guidelines will be given to patient during program as part of exercise prescription that the participant will acknowledge.   Education: Aerobic Exercise: - Group verbal and visual presentation on the components of exercise prescription.  Introduces F.I.T.T principle from ACSM for exercise prescriptions.  Reviews F.I.T.T. principles of aerobic exercise including progression. Written material provided at class time.   Education: Resistance Exercise: - Group verbal and visual presentation on the components of exercise prescription. Introduces F.I.T.T principle from ACSM for exercise prescriptions  Reviews F.I.T.T. principles of resistance exercise including progression. Written material provided at class time.    Education: Exercise & Equipment Safety: - Individual verbal instruction and demonstration of equipment use and safety with use of the equipment.   Education: Exercise Physiology & General Exercise Guidelines: - Group verbal and written instruction with models to review the exercise physiology of the cardiovascular system and associated critical values. Provides general exercise guidelines with specific guidelines to those with heart or lung disease. Written material provided at class time.   Education: Flexibility, Balance, Mind/Body Relaxation: - Group verbal and visual presentation with interactive activity on the components of exercise prescription. Introduces F.I.T.T principle from ACSM for exercise prescriptions. Reviews F.I.T.T. principles of flexibility and balance exercise training including progression. Also discusses the mind body connection.  Reviews various relaxation techniques to help reduce and manage stress (i.e. Deep breathing, progressive muscle relaxation, and visualization). Balance handout provided to take home. Written material provided at class time.   Activity Barriers & Risk Stratification:   6 Minute Walk:  6 Minute Walk     Row Name 09/18/23 1103         6 Minute Walk   Phase Initial     Distance 1343 feet     Walk Time 6 minutes     # of Rest Breaks 0     MPH 2.5     METS 3.8     RPE 8     Perceived Dyspnea  0     VO2 Peak 13.34     Symptoms No     Resting HR 88 bpm      Resting BP 92/62     Resting Oxygen Saturation  100 %     Max Ex. HR 115 bpm     Max Ex. BP 98/60     2 Minute Post BP 94/60        Oxygen Initial Assessment:   Oxygen Re-Evaluation:   Oxygen Discharge (Final Oxygen Re-Evaluation):   Initial Exercise Prescription:  Initial Exercise Prescription - 09/18/23 1100       Date of Initial Exercise RX and Referring Provider   Date 09/18/23    Referring Provider Morene Brownie, MD    Expected Discharge Date 12/14/23      Treadmill   MPH 1.8    Grade 0.5    Minutes 15    METs 2.5      Bike   Level 1    Watts 22  Minutes 15    METs 2.2      Prescription Details   Frequency (times per week) 3 days/week    Duration Progress to 30 minutes of continuous aerobic without signs/symptoms of physical distress      Intensity   THRR 40-80% of Max Heartrate 66-132    Ratings of Perceived Exertion 11-13    Perceived Dyspnea 0-4      Progression   Progression Continue to progress workloads to maintain intensity without signs/symptoms of physical distress.      Resistance Training   Training Prescription Yes    Weight 3    Reps 10-15          Perform Capillary Blood Glucose checks as needed.  Exercise Prescription Changes:   Exercise Prescription Changes     Row Name 09/24/23 1400 10/05/23 1700 10/24/23 1500 10/26/23 1400 11/02/23 1500     Response to Exercise   Blood Pressure (Admit) 100/70 96/58 90/62  94/60 96/64   Blood Pressure (Exercise) 110/62 100/68 -- -- --   Blood Pressure (Exit) 92/66 92/64 92/54  90/62 93/61   Heart Rate (Admit) 85 bpm 70 bpm 83 bpm 84 bpm 77 bpm   Heart Rate (Exercise) 112 bpm 118 bpm 115 bpm 114 bpm 113 bpm   Heart Rate (Exit) 1.39 bpm 82 bpm 91 bpm 93 bpm 86 bpm   Rating of Perceived Exertion (Exercise) 12 9 9 8 9    Symptoms None None None None None   Comments Pt's first day in the CRP2 program Reviewed METs Reviewed METs and goals Reviewed home exercise Rx Reviewed METs   Duration  Continue with 30 min of aerobic exercise without signs/symptoms of physical distress. Continue with 30 min of aerobic exercise without signs/symptoms of physical distress. Continue with 30 min of aerobic exercise without signs/symptoms of physical distress. Continue with 30 min of aerobic exercise without signs/symptoms of physical distress. Continue with 30 min of aerobic exercise without signs/symptoms of physical distress.   Intensity THRR unchanged THRR unchanged THRR unchanged THRR unchanged THRR unchanged     Progression   Progression Continue to progress workloads to maintain intensity without signs/symptoms of physical distress. Continue to progress workloads to maintain intensity without signs/symptoms of physical distress. Continue to progress workloads to maintain intensity without signs/symptoms of physical distress. Continue to progress workloads to maintain intensity without signs/symptoms of physical distress. Continue to progress workloads to maintain intensity without signs/symptoms of physical distress.   Average METs 3.47 3.15 3.25 3.55 3.8     Resistance Training   Training Prescription Yes Yes No Yes Yes   Weight 3 lbs 3 lbs No wts on wednesdays 5 lbs 5 lbs   Reps 10-15 10-15 -- 10-15 10-15   Time 5 Minutes 5 Minutes -- 5 Minutes 5 Minutes     Interval Training   Interval Training No No No No No     Treadmill   MPH 2 2.5 2.5 2.7 2.8   Grade 0.5 5 1  1.5 2   Minutes 15 15 15 15 15    METs 2.67 3.1 3.26 3.63 3.91     Bike   Level 1 2 2 2 3    Watts 42.5 22.7 23.7 30 33   Minutes 15 15 15 15 15    METs 4.27 3.2 3.25 3.5 3.75     Home Exercise Plan   Plans to continue exercise at -- -- -- Home (comment) Home (comment)   Frequency -- -- -- Add 2 additional days to program  exercise sessions. Add 2 additional days to program exercise sessions.   Initial Home Exercises Provided -- -- -- 10/26/23 10/26/23    Row Name 11/28/23 1600             Response to Exercise    Blood Pressure (Admit) 96/68       Blood Pressure (Exit) 94/60       Heart Rate (Admit) 77 bpm       Heart Rate (Exercise) 110 bpm       Heart Rate (Exit) 84 bpm       Rating of Perceived Exertion (Exercise) 10       Symptoms None       Comments Reviewed METs and goals       Duration Continue with 30 min of aerobic exercise without signs/symptoms of physical distress.       Intensity THRR unchanged         Progression   Progression Continue to progress workloads to maintain intensity without signs/symptoms of physical distress.       Average METs 4.4         Resistance Training   Training Prescription Yes       Weight No wts on Wednesdays         Interval Training   Interval Training No         Treadmill   MPH 3.3       Grade 2.5       Minutes 15       METs 4.66         Bike   Level 3       Watts 39       Minutes 15       METs 4.1         Home Exercise Plan   Plans to continue exercise at Home (comment)       Frequency Add 2 additional days to program exercise sessions.       Initial Home Exercises Provided 10/26/23          Exercise Comments:   Exercise Comments     Row Name 09/18/23 1108 09/24/23 1422 10/05/23 1707 10/24/23 1500 10/26/23 1429   Exercise Comments Pt orientation, pt completed w/o unsual signs or symptoms. Pt mainted SR throughout test. Pt's first day in the CRP2 program. Pt tolerated session well with no complaints. Reviewded METs. It appears as though there may have been an error on day 1 bike METs. Pt is making progress. Pt exhibits low blood pressures but tolerates them. Pt will increase workload on treamill next week Reviewed METs and goals today. Pt is making progress. Pt will increase workloads on the bike and treadmill next session. Reviewed home exercise Rx with patient today. Pt will walk and retrun to her yoga class. Encouraged 2x/week 30 minutes to achieved her 150 minutes of exercise goal. Pt verbalized understanding of the home exercise  Rx and was provided a copy.    Row Name 11/02/23 1529 11/28/23 1720         Exercise Comments Reviewed METs. Pt is making progress. Has increased on treadmill and bike since last review. Pt voices returning to her yoga class, whcih was a goal. She is tolerating the yoga class well. Reviewed METs. Pt is making progress. Has increased on treadmill and bike since last review. Pt has goal to retrun to pickleball. METs are sufficient to return to it; pt encouraged to do so.  Exercise Goals and Review:   Exercise Goals     Row Name 09/18/23 1107             Exercise Goals   Increase Physical Activity Yes       Intervention Develop an individualized exercise prescription for aerobic and resistive training based on initial evaluation findings, risk stratification, comorbidities and participant's personal goals.;Provide advice, education, support and counseling about physical activity/exercise needs.       Expected Outcomes Short Term: Attend rehab on a regular basis to increase amount of physical activity.;Long Term: Add in home exercise to make exercise part of routine and to increase amount of physical activity.;Long Term: Exercising regularly at least 3-5 days a week.       Increase Strength and Stamina Yes       Intervention Provide advice, education, support and counseling about physical activity/exercise needs.;Develop an individualized exercise prescription for aerobic and resistive training based on initial evaluation findings, risk stratification, comorbidities and participant's personal goals.       Expected Outcomes Short Term: Increase workloads from initial exercise prescription for resistance, speed, and METs.;Long Term: Improve cardiorespiratory fitness, muscular endurance and strength as measured by increased METs and functional capacity ( );Short Term: Perform resistance training exercises routinely during rehab and add in resistance training at home       Able to  understand and use rate of perceived exertion (RPE) scale Yes       Intervention Provide education and explanation on how to use RPE scale       Expected Outcomes Short Term: Able to use RPE daily in rehab to express subjective intensity level;Long Term:  Able to use RPE to guide intensity level when exercising independently       Able to understand and use Dyspnea scale Yes       Intervention Provide education and explanation on how to use Dyspnea scale       Expected Outcomes Long Term: Able to use Dyspnea scale to guide intensity level when exercising independently;Short Term: Able to use Dyspnea scale daily in rehab to express subjective sense of shortness of breath during exertion       Knowledge and understanding of Target Heart Rate Range (THRR) Yes       Intervention Provide education and explanation of THRR including how the numbers were predicted and where they are located for reference       Expected Outcomes Short Term: Able to state/look up THRR;Long Term: Able to use THRR to govern intensity when exercising independently;Short Term: Able to use daily as guideline for intensity in rehab       Understanding of Exercise Prescription Yes       Intervention Provide education, explanation, and written materials on patient's individual exercise prescription       Expected Outcomes Short Term: Able to explain program exercise prescription;Long Term: Able to explain home exercise prescription to exercise independently          Exercise Goals Re-Evaluation :  Exercise Goals Re-Evaluation     Row Name 09/24/23 1421 10/24/23 1500 11/28/23 1718         Exercise Goal Re-Evaluation   Exercise Goals Review Increase Physical Activity;Understanding of Exercise Prescription;Increase Strength and Stamina;Knowledge and understanding of Target Heart Rate Range (THRR);Able to understand and use rate of perceived exertion (RPE) scale Increase Physical Activity;Understanding of Exercise  Prescription;Increase Strength and Stamina;Knowledge and understanding of Target Heart Rate Range (THRR);Able to understand and use rate of perceived exertion (  RPE) scale Increase Physical Activity;Understanding of Exercise Prescription;Increase Strength and Stamina;Knowledge and understanding of Target Heart Rate Range (THRR);Able to understand and use rate of perceived exertion (RPE) scale     Comments Pt's first day in the CRP2 program. Pt understands the exercise Rx, RPE scale and THRR. Reviewed METs and goals today. Pt voices progress on her goals of incresed stamina, climbing stairs with less/no SOB, and increased energy. Peak METs to date are 3.3. Reviewed METs and goals today. Pt contines to voice progress on her goals of incresed stamina, climbing stairs with less/no SOB, and increased energy. Peak METs to date are 4.7. Pt aslo is doing yoga again, which was a goal.     Expected Outcomes Will continue to monitor patient and progress exercise workloads as tolerated. Will continue to monitor patient and progress exercise workloads as tolerated. Will continue to monitor patient and progress exercise workloads as tolerated.        Discharge Exercise Prescription (Final Exercise Prescription Changes):  Exercise Prescription Changes - 11/28/23 1600       Response to Exercise   Blood Pressure (Admit) 96/68    Blood Pressure (Exit) 94/60    Heart Rate (Admit) 77 bpm    Heart Rate (Exercise) 110 bpm    Heart Rate (Exit) 84 bpm    Rating of Perceived Exertion (Exercise) 10    Symptoms None    Comments Reviewed METs and goals    Duration Continue with 30 min of aerobic exercise without signs/symptoms of physical distress.    Intensity THRR unchanged      Progression   Progression Continue to progress workloads to maintain intensity without signs/symptoms of physical distress.    Average METs 4.4      Resistance Training   Training Prescription Yes    Weight No wts on Wednesdays       Interval Training   Interval Training No      Treadmill   MPH 3.3    Grade 2.5    Minutes 15    METs 4.66      Bike   Level 3    Watts 39    Minutes 15    METs 4.1      Home Exercise Plan   Plans to continue exercise at Home (comment)    Frequency Add 2 additional days to program exercise sessions.    Initial Home Exercises Provided 10/26/23          Nutrition:  Target Goals: Understanding of nutrition guidelines, daily intake of sodium 1500mg , cholesterol 200mg , calories 30% from fat and 7% or less from saturated fats, daily to have 5 or more servings of fruits and vegetables.  Education: Nutrition 1 -Group instruction provided by verbal, written material, interactive activities, discussions, models, and posters to present general guidelines for heart healthy nutrition including macronutrients, label reading, and promoting whole foods over processed counterparts. Education serves as pensions consultant of discussion of heart healthy eating for all. Written material provided at class time.    Education: Nutrition 2 -Group instruction provided by verbal, written material, interactive activities, discussions, models, and posters to present general guidelines for heart healthy nutrition including sodium, cholesterol, and saturated fat. Providing guidance of habit forming to improve blood pressure, cholesterol, and body weight. Written material provided at class time.     Biometrics:  Pre Biometrics - 09/18/23 0757       Pre Biometrics   Waist Circumference 30 inches    Hip Circumference 35 inches  Waist to Hip Ratio 0.86 %    Triceps Skinfold 9 mm    % Body Fat 28.1 %    Grip Strength 20 kg    Flexibility 16.25 in    Single Leg Stand 30 seconds           Nutrition Therapy Plan and Nutrition Goals:   Nutrition Assessments:  MEDIFICTS Score Key: >=70 Need to make dietary changes  40-70 Heart Healthy Diet <= 40 Therapeutic Level Cholesterol Diet  Flowsheet Row  INTENSIVE CARDIAC REHAB from 09/24/2023 in [facility]  Picture Your Plate Total Score on Admission 66   Picture Your Plate Scores: <59 Unhealthy dietary pattern with much room for improvement. 41-50 Dietary pattern unlikely to meet recommendations for good health and room for improvement. 51-60 More healthful dietary pattern, with some room for improvement.  >60 Healthy dietary pattern, although there may be some specific behaviors that could be improved.    Nutrition Goals Re-Evaluation:   Nutrition Goals Discharge (Final Nutrition Goals Re-Evaluation):   Psychosocial: Target Goals: Acknowledge presence or absence of significant depression and/or stress, maximize coping skills, provide positive support system. Participant is able to verbalize types and ability to use techniques and skills needed for reducing stress and depression.   Education: Stress, Anxiety, and Depression - Group verbal and visual presentation to define topics covered.  Reviews how body is impacted by stress, anxiety, and depression.  Also discusses healthy ways to reduce stress and to treat/manage anxiety and depression. Written material provided at class time.   Education: Sleep Hygiene -Provides group verbal and written instruction about how sleep can affect your health.  Define sleep hygiene, discuss sleep cycles and impact of sleep habits. Review good sleep hygiene tips.   Initial Review & Psychosocial Screening:  Initial Psych Review & Screening - 09/18/23 0851       Initial Review   Current issues with Current Stress Concerns;Current Anxiety/Panic;Current Depression    Source of Stress Concerns Chronic Illness;Family      Family Dynamics   Good Support System? Yes   Pt has a partner, two sons, mom, and friends in her support system     Barriers   Psychosocial barriers to participate in program The patient should benefit from training in  stress management and relaxation.      Screening Interventions   Interventions Encouraged to exercise;To provide support and resources with identified psychosocial needs;Provide feedback about the scores to participant    Expected Outcomes Long Term Goal: Stressors or current issues are controlled or eliminated.;Short Term goal: Identification and review with participant of any Quality of Life or Depression concerns found by scoring the questionnaire.;Long Term goal: The participant improves quality of Life and PHQ9 Scores as seen by post scores and/or verbalization of changes          Quality of Life Scores:   Quality of Life - 09/18/23 1102       Quality of Life   Select Quality of Life      Quality of Life Scores   Health/Function Pre 20.4 %    Socioeconomic Pre 29.14 %    Psych/Spiritual Pre 28.29 %    Family Pre 30 %    GLOBAL Pre 25.24 %         Scores of 19 and below usually indicate a poorer quality of life in these areas.  A difference of  2-3 points is a clinically meaningful difference.  A difference of 2-3 points in the total score of the Quality of Life Index has been associated with significant improvement in overall quality of life, self-image, physical symptoms, and general health in studies assessing change in quality of life.  PHQ-9: Review Flowsheet  More data exists      10/31/2023 09/18/2023 05/21/2023 09/18/2022 05/16/2022  Depression screen PHQ 2/9  Decreased Interest 0 1 0 0 0  Down, Depressed, Hopeless 0 1 0 0 0  PHQ - 2 Score 0 2 0 0 0  Altered sleeping 0 0 0 0 3  Tired, decreased energy 0 2 0 0 0  Change in appetite 0 1 0 0 0  Feeling bad or failure about yourself  0 0 0 0 0  Trouble concentrating 0 0 0 0 2  Moving slowly or fidgety/restless 0 1 0 0 0  Suicidal thoughts 0 0 0 0 0  PHQ-9 Score 0  6  0  0  5   Difficult doing work/chores Not difficult at all Not difficult at all Not difficult at all Not difficult at all Somewhat difficult     Details       Data saved with a previous flowsheet row definition        Interpretation of Total Score  Total Score Depression Severity:  1-4 = Minimal depression, 5-9 = Mild depression, 10-14 = Moderate depression, 15-19 = Moderately severe depression, 20-27 = Severe depression   Psychosocial Evaluation and Intervention:   Psychosocial Re-Evaluation:  Psychosocial Re-Evaluation     Row Name 09/25/23 0756 10/02/23 1620 11/02/23 1101 12/04/23 1007       Psychosocial Re-Evaluation   Current issues with Current Stress Concerns;Current Anxiety/Panic;Current Depression Current Stress Concerns;Current Anxiety/Panic;Current Depression Current Stress Concerns;Current Anxiety/Panic;Current Depression Current Stress Concerns;Current Anxiety/Panic;Current Depression    Comments Karen Cameron says she is still dealing with having a major coronary event. Will discuss PHQ9 in the upcoming future, Karen Cameron says she is still dealing with having a major coronary event. Discussed PHQ9 on 09/28/23 and score has improved from a 6 to a 4. Discussed PHQ9 on 10/31/23 and score has improved to a 0.  No additional psychosocial concerns/stressors mentioned during exercise at [facility]. Karen Cameron has not voiced any additional psychosocial concerns/stressors during exercise at [facility].    Expected Outcomes Karen Cameron will have controlled or decreased anxiety/ stressors upon completion of cardiac rehab Karen Cameron will have controlled or decreased anxiety/ stressors upon completion of cardiac rehab Karen Cameron will have controlled or decreased anxiety/ stressors upon completion of cardiac rehab Karen Cameron will have controlled or decreased anxiety/stressors upon completion of cardiac rehab    Interventions Stress management education;Relaxation education;Encouraged to attend Cardiac Rehabilitation for the exercise Stress management education;Relaxation education;Encouraged to attend Cardiac Rehabilitation for the exercise Stress management  education;Relaxation education;Encouraged to attend Cardiac Rehabilitation for the exercise Stress management education;Relaxation education;Encouraged to attend Cardiac Rehabilitation for the exercise    Continue Psychosocial Services  Follow up required by staff Follow up required by staff Follow up required by staff No Follow up required      Initial Review   Source of Stress Concerns Chronic Illness -- -- --    Comments Will continue to monitor and offer support as needed. -- -- --       Psychosocial Discharge (Final Psychosocial Re-Evaluation):  Psychosocial Re-Evaluation - 12/04/23 1007       Psychosocial Re-Evaluation   Current issues with Current Stress Concerns;Current Anxiety/Panic;Current Depression    Comments Karen Cameron has not voiced any additional psychosocial  concerns/stressors during exercise at [facility].    Expected Outcomes Karen Cameron will have controlled or decreased anxiety/stressors upon completion of cardiac rehab    Interventions Stress management education;Relaxation education;Encouraged to attend Cardiac Rehabilitation for the exercise    Continue Psychosocial Services  No Follow up required          Vocational Rehabilitation: Provide vocational rehab assistance to qualifying candidates.   Vocational Rehab Evaluation & Intervention:  Vocational Rehab - 09/18/23 1103       Initial Vocational Rehab Evaluation & Intervention   Assessment shows need for Vocational Rehabilitation No   Pt currently employed         Education: Education Goals: Education classes will be provided on a variety of topics geared toward better understanding of heart health and risk factor modification. Participant will state understanding/return demonstration of topics presented as noted by education test scores.  Learning Barriers/Preferences:  Learning Barriers/Preferences - 09/18/23 1102       Learning Barriers/Preferences   Learning Barriers None    Learning Preferences Verbal  Instruction;Written Material;Skilled Demonstration          General Cardiac Education Topics:  AED/CPR: - Group verbal and written instruction with the use of models to demonstrate the basic use of the AED with the basic ABC's of resuscitation.   Test and Procedures: - Group verbal and visual presentation and models provide information about basic cardiac anatomy and function. Reviews the testing methods done to diagnose heart disease and the outcomes of the test results. Describes the treatment choices: Medical Management, Angioplasty, or Coronary Bypass Surgery for treating various heart conditions including Myocardial Infarction, Angina, Valve Disease, and Cardiac Arrhythmias. Written material provided at class time.   Medication Safety: - Group verbal and visual instruction to review commonly prescribed medications for heart and lung disease. Reviews the medication, class of the drug, and side effects. Includes the steps to properly store meds and maintain the prescription regimen. Written material provided at class time.   Intimacy: - Group verbal instruction through game format to discuss how heart and lung disease can affect sexual intimacy. Written material provided at class time.   Know Your Numbers and Heart Failure: - Group verbal and visual instruction to discuss disease risk factors for cardiac and pulmonary disease and treatment options.  Reviews associated critical values for Overweight/Obesity, Hypertension, Cholesterol, and Diabetes.  Discusses basics of heart failure: signs/symptoms and treatments.  Introduces Heart Failure Zone chart for action plan for heart failure. Written material provided at class time.   Infection Prevention: - Provides verbal and written material to individual with discussion of infection control including proper hand washing and proper equipment cleaning during exercise session.   Falls Prevention: - Provides verbal and written material to  individual with discussion of falls prevention and safety.   Other: -Provides group and verbal instruction on various topics (see comments)   Knowledge Questionnaire Score:  Knowledge Questionnaire Score - 09/18/23 1030       Knowledge Questionnaire Score   Pre Score 24/24          Core Components/Risk Factors/Patient Goals at Admission:  Personal Goals and Risk Factors at Admission - 09/18/23 0859       Core Components/Risk Factors/Patient Goals on Admission    Weight Management Yes;Weight Maintenance    Intervention Weight Management: Develop a combined nutrition and exercise program designed to reach desired caloric intake, while maintaining appropriate intake of nutrient and fiber, sodium and fats, and appropriate energy expenditure required for the  weight goal.;Weight Management: Provide education and appropriate resources to help participant work on and attain dietary goals.    Admit Weight 129 lb 13.6 oz (58.9 kg)    Expected Outcomes Weight Maintenance: Understanding of the daily nutrition guidelines, which includes 25-35% calories from fat, 7% or less cal from saturated fats, less than 200mg  cholesterol, less than 1.5gm of sodium, & 5 or more servings of fruits and vegetables daily;Understanding recommendations for meals to include 15-35% energy as protein, 25-35% energy from fat, 35-60% energy from carbohydrates, less than 200mg  of dietary cholesterol, 20-35 gm of total fiber daily;Understanding of distribution of calorie intake throughout the day with the consumption of 4-5 meals/snacks    Heart Failure Yes    Intervention Provide a combined exercise and nutrition program that is supplemented with education, support and counseling about heart failure. Directed toward relieving symptoms such as shortness of breath, decreased exercise tolerance, and extremity edema.    Expected Outcomes Short term: Attendance in program 2-3 days a week with increased exercise capacity. Reported  lower sodium intake. Reported increased fruit and vegetable intake. Reports medication compliance.;Short term: Daily weights obtained and reported for increase. Utilizing diuretic protocols set by physician.;Long term: Adoption of self-care skills and reduction of barriers for early signs and symptoms recognition and intervention leading to self-care maintenance.;Improve functional capacity of life    Hypertension Yes    Intervention Provide education on lifestyle modifcations including regular physical activity/exercise, weight management, moderate sodium restriction and increased consumption of fresh fruit, vegetables, and low fat dairy, alcohol moderation, and smoking cessation.;Monitor prescription use compliance.    Expected Outcomes Short Term: Continued assessment and intervention until BP is < 140/24mm HG in hypertensive participants. < 130/24mm HG in hypertensive participants with diabetes, heart failure or chronic kidney disease.;Long Term: Maintenance of blood pressure at goal levels.    Lipids Yes    Intervention Provide education and support for participant on nutrition & aerobic/resistive exercise along with prescribed medications to achieve LDL 70mg , HDL >40mg .    Expected Outcomes Short Term: Participant states understanding of desired cholesterol values and is compliant with medications prescribed. Participant is following exercise prescription and nutrition guidelines.;Long Term: Cholesterol controlled with medications as prescribed, with individualized exercise RX and with personalized nutrition plan. Value goals: LDL < 70mg , HDL > 40 mg.    Stress Yes    Intervention Offer individual and/or small group education and counseling on adjustment to heart disease, stress management and health-related lifestyle change. Teach and support self-help strategies.;Refer participants experiencing significant psychosocial distress to appropriate mental health specialists for further evaluation and  treatment. When possible, include family members and significant others in education/counseling sessions.    Expected Outcomes Short Term: Participant demonstrates changes in health-related behavior, relaxation and other stress management skills, ability to obtain effective social support, and compliance with psychotropic medications if prescribed.;Long Term: Emotional wellbeing is indicated by absence of clinically significant psychosocial distress or social isolation.          Education:Diabetes - Individual verbal and written instruction to review signs/symptoms of diabetes, desired ranges of glucose level fasting, after meals and with exercise. Acknowledge that pre and post exercise glucose checks will be done for 3 sessions at entry of program.   Core Components/Risk Factors/Patient Goals Review:   Goals and Risk Factor Review     Row Name 09/18/23 0856 10/02/23 1624 11/02/23 1100 12/04/23 1008       Core Components/Risk Factors/Patient Goals Review   Personal Goals Review -- Heart  Failure;Hypertension;Lipids;Stress;Weight Management/Obesity Heart Failure;Hypertension;Lipids;Stress;Weight Management/Obesity Heart Failure;Hypertension;Lipids;Stress;Weight Management/Obesity    Review -- Karen Cameron is doing well with exercise at cardiac rehab. VSS. Karen Cameron has begun to increase her treadmill met levels. Karen Cameron is doing well with exercise at cardiac rehab. VSS. Karen Cameron has increased her met levels over the last 30 days Karen Cameron is doing well with exercise at cardiac rehab. VSS. Karen Cameron has continued to increase her met levels over the last 30 days    Expected Outcomes -- Karen Cameron will continue to particpate in cardiac rehab for exercise nutriton and lifestyle modifications. Karen Cameron will continue to particpate in cardiac rehab for exercise nutriton and lifestyle modifications. Karen Cameron will continue to particpate in cardiac rehab for exercise nutriton and lifestyle modifications.       Core  Components/Risk Factors/Patient Goals at Discharge (Final Review):   Goals and Risk Factor Review - 12/04/23 1008       Core Components/Risk Factors/Patient Goals Review   Personal Goals Review Heart Failure;Hypertension;Lipids;Stress;Weight Management/Obesity    Review Karen Cameron is doing well with exercise at cardiac rehab. VSS. Karen Cameron has continued to increase her met levels over the last 30 days    Expected Outcomes Karen Cameron will continue to particpate in cardiac rehab for exercise nutriton and lifestyle modifications.          ITP Comments:  ITP Comments     Row Name 09/18/23 0757 09/25/23 0755 10/02/23 1617 11/02/23 1059 12/04/23 1006   ITP Comments Wilbert Bihari, MD: Medical Director.  Introduction to the [employer] / Intensive Cardiac Rehab.  Intial orientation packet reviewed with the patient. 30 Day ITP Review. Karen Cameron started cardiac rehab on 09/25/23. Camille did well with exercise. 30 Day ITP Review. Karen Cameron is doing well with exercise at cardiac rehab. 30 Day ITP Review. Karen Cameron continues to do well with exercise at cardiac rehab. 30 Day ITP Review. Karen Cameron is doing well with exercise at cardiac rehab.      Comments: see ITP comments

## 2023-12-05 ENCOUNTER — Encounter (HOSPITAL_COMMUNITY)
Admission: RE | Admit: 2023-12-05 | Discharge: 2023-12-05 | Disposition: A | Source: Ambulatory Visit | Attending: Cardiology

## 2023-12-05 DIAGNOSIS — I2102 ST elevation (STEMI) myocardial infarction involving left anterior descending coronary artery: Secondary | ICD-10-CM | POA: Diagnosis not present

## 2023-12-05 DIAGNOSIS — Z955 Presence of coronary angioplasty implant and graft: Secondary | ICD-10-CM

## 2023-12-07 ENCOUNTER — Encounter (HOSPITAL_COMMUNITY)
Admission: RE | Admit: 2023-12-07 | Discharge: 2023-12-07 | Disposition: A | Source: Ambulatory Visit | Attending: Cardiology

## 2023-12-07 DIAGNOSIS — I2102 ST elevation (STEMI) myocardial infarction involving left anterior descending coronary artery: Secondary | ICD-10-CM

## 2023-12-07 DIAGNOSIS — Z955 Presence of coronary angioplasty implant and graft: Secondary | ICD-10-CM

## 2023-12-10 ENCOUNTER — Encounter (HOSPITAL_COMMUNITY)
Admission: RE | Admit: 2023-12-10 | Discharge: 2023-12-10 | Disposition: A | Source: Ambulatory Visit | Attending: Cardiology

## 2023-12-10 VITALS — Ht 63.0 in | Wt 130.1 lb

## 2023-12-10 DIAGNOSIS — I2102 ST elevation (STEMI) myocardial infarction involving left anterior descending coronary artery: Secondary | ICD-10-CM | POA: Diagnosis not present

## 2023-12-10 DIAGNOSIS — Z955 Presence of coronary angioplasty implant and graft: Secondary | ICD-10-CM

## 2023-12-10 NOTE — Progress Notes (Signed)
 Discharge Progress Report  Patient Details  Name: Karen Cameron MRN: [id number] Date of Birth: 05-05-1965 Referring Provider:   Flowsheet Row INTENSIVE CARDIAC REHAB ORIENT from 09/18/2023 in [facility]  Referring Provider Morene Brownie, MD     Number of Visits: 49  Reason for Discharge:  Patient reached a stable level of exercise. Patient independent in their exercise. Patient has met program and personal goals.  Smoking History:  Social History   Tobacco Use  Smoking Status Never  Smokeless Tobacco Never    Diagnosis:  08/19/23 ST elevation myocardial infarction involving left anterior descending (LAD) coronary artery (HCC)  08/19/23 Status post coronary artery stent placement  ADL UCSD:   Initial Exercise Prescription:  Initial Exercise Prescription - 09/18/23 1100       Date of Initial Exercise RX and Referring Provider   Date 09/18/23    Referring Provider Morene Brownie, MD    Expected Discharge Date 12/14/23      Treadmill   MPH 1.8    Grade 0.5    Minutes 15    METs 2.5      Bike   Level 1    Watts 22    Minutes 15    METs 2.2      Prescription Details   Frequency (times per week) 3 days/week    Duration Progress to 30 minutes of continuous aerobic without signs/symptoms of physical distress      Intensity   THRR 40-80% of Max Heartrate 66-132    Ratings of Perceived Exertion 11-13    Perceived Dyspnea 0-4      Progression   Progression Continue to progress workloads to maintain intensity without signs/symptoms of physical distress.      Resistance Training   Training Prescription Yes    Weight 3    Reps 10-15          Discharge Exercise Prescription (Final Exercise Prescription Changes):  Exercise Prescription Changes - 12/12/23 1500       Response to Exercise   Blood Pressure (Admit) 105/72    Blood Pressure (Exit) 90/64    Heart Rate (Admit) 73 bpm    Heart Rate  (Exercise) 112 bpm    Heart Rate (Exit) 82 bpm    Rating of Perceived Exertion (Exercise) 9    Symptoms None    Comments Pt graduated from the CRP2 program    Duration Continue with 30 min of aerobic exercise without signs/symptoms of physical distress.    Intensity THRR unchanged      Progression   Progression Continue to progress workloads to maintain intensity without signs/symptoms of physical distress.    Average METs 4.6      Resistance Training   Weight No wts on Wednesdays      Interval Training   Interval Training No      Bike   Level 3.3    Watts 22    Minutes 15    METs 4.1      Recumbant Elliptical   Level 4    RPM 48    Watts 77    Minutes 15    METs 5.1      Home Exercise Plan   Plans to continue exercise at Home (comment)    Frequency Add 2 additional days to program exercise sessions.    Initial Home Exercises Provided 10/26/23          Functional Capacity:  6 Minute Walk  Row Name 09/18/23 1103 12/07/23 1315       6 Minute Walk   Phase Initial Discharge    Distance 1343 feet 1629 feet    Distance Feet Change -- 286 ft    Walk Time 6 minutes 6 minutes    # of Rest Breaks 0 0    MPH 2.5 3.1    METS 3.8 4.2    RPE 8 8    Perceived Dyspnea  0 0    VO2 Peak 13.34 14.8    Symptoms No No    Resting HR 88 bpm 70 bpm    Resting BP 92/62 90/58    Resting Oxygen Saturation  100 % --    Max Ex. HR 115 bpm 98 bpm    Max Ex. BP 98/60 104/60    2 Minute Post BP 94/60 --       Psychological, QOL, Others - Outcomes: PHQ 2/9:    12/10/2023    1:25 PM 10/31/2023   12:56 PM 09/18/2023    8:24 AM 05/21/2023    8:39 AM 09/18/2022    8:35 AM  Depression screen PHQ 2/9  Decreased Interest 0 0 1 0 0  Down, Depressed, Hopeless 0 0 1 0 0  PHQ - 2 Score 0 0 2 0 0  Altered sleeping 0 0 0 0 0  Tired, decreased energy 0 0 2 0 0  Change in appetite 0 0 1 0 0  Feeling bad or failure about yourself  0 0 0 0 0  Trouble concentrating 0 0 0 0 0  Moving  slowly or fidgety/restless 0 0 1 0 0  Suicidal thoughts 0 0 0 0 0  PHQ-9 Score 0 0  6  0  0   Difficult doing work/chores  Not difficult at all Not difficult at all Not difficult at all Not difficult at all     Data saved with a previous flowsheet row definition    Quality of Life:  Quality of Life - 12/10/23 1631       Quality of Life   Select Quality of Life      Quality of Life Scores   Health/Function Pre 20.4 %    Health/Function Post 28.8 %    Health/Function % Change 41.18 %    Socioeconomic Pre 29.14 %    Socioeconomic Post 30 %    Socioeconomic % Change  2.95 %    Psych/Spiritual Pre 28.29 %    Psych/Spiritual Post 30 %    Psych/Spiritual % Change 6.04 %    Family Pre 30 %    Family Post 30 %    Family % Change 0 %    GLOBAL Pre 25.24 %    GLOBAL Post 29.47 %    GLOBAL % Change 16.76 %          Personal Goals: Goals established at orientation with interventions provided to work toward goal.  Personal Goals and Risk Factors at Admission - 09/18/23 0859       Core Components/Risk Factors/Patient Goals on Admission    Weight Management Yes;Weight Maintenance    Intervention Weight Management: Develop a combined nutrition and exercise program designed to reach desired caloric intake, while maintaining appropriate intake of nutrient and fiber, sodium and fats, and appropriate energy expenditure required for the weight goal.;Weight Management: Provide education and appropriate resources to help participant work on and attain dietary goals.    Admit Weight 129 lb 13.6 oz (58.9 kg)  Expected Outcomes Weight Maintenance: Understanding of the daily nutrition guidelines, which includes 25-35% calories from fat, 7% or less cal from saturated fats, less than 200mg  cholesterol, less than 1.5gm of sodium, & 5 or more servings of fruits and vegetables daily;Understanding recommendations for meals to include 15-35% energy as protein, 25-35% energy from fat, 35-60% energy from  carbohydrates, less than 200mg  of dietary cholesterol, 20-35 gm of total fiber daily;Understanding of distribution of calorie intake throughout the day with the consumption of 4-5 meals/snacks    Heart Failure Yes    Intervention Provide a combined exercise and nutrition program that is supplemented with education, support and counseling about heart failure. Directed toward relieving symptoms such as shortness of breath, decreased exercise tolerance, and extremity edema.    Expected Outcomes Short term: Attendance in program 2-3 days a week with increased exercise capacity. Reported lower sodium intake. Reported increased fruit and vegetable intake. Reports medication compliance.;Short term: Daily weights obtained and reported for increase. Utilizing diuretic protocols set by physician.;Long term: Adoption of self-care skills and reduction of barriers for early signs and symptoms recognition and intervention leading to self-care maintenance.;Improve functional capacity of life    Hypertension Yes    Intervention Provide education on lifestyle modifcations including regular physical activity/exercise, weight management, moderate sodium restriction and increased consumption of fresh fruit, vegetables, and low fat dairy, alcohol moderation, and smoking cessation.;Monitor prescription use compliance.    Expected Outcomes Short Term: Continued assessment and intervention until BP is < 140/60mm HG in hypertensive participants. < 130/26mm HG in hypertensive participants with diabetes, heart failure or chronic kidney disease.;Long Term: Maintenance of blood pressure at goal levels.    Lipids Yes    Intervention Provide education and support for participant on nutrition & aerobic/resistive exercise along with prescribed medications to achieve LDL 70mg , HDL >40mg .    Expected Outcomes Short Term: Participant states understanding of desired cholesterol values and is compliant with medications prescribed. Participant  is following exercise prescription and nutrition guidelines.;Long Term: Cholesterol controlled with medications as prescribed, with individualized exercise RX and with personalized nutrition plan. Value goals: LDL < 70mg , HDL > 40 mg.    Stress Yes    Intervention Offer individual and/or small group education and counseling on adjustment to heart disease, stress management and health-related lifestyle change. Teach and support self-help strategies.;Refer participants experiencing significant psychosocial distress to appropriate mental health specialists for further evaluation and treatment. When possible, include family members and significant others in education/counseling sessions.    Expected Outcomes Short Term: Participant demonstrates changes in health-related behavior, relaxation and other stress management skills, ability to obtain effective social support, and compliance with psychotropic medications if prescribed.;Long Term: Emotional wellbeing is indicated by absence of clinically significant psychosocial distress or social isolation.           Personal Goals Discharge:  Goals and Risk Factor Review     Row Name 09/18/23 0856 10/02/23 1624 11/02/23 1100 12/04/23 1008       Core Components/Risk Factors/Patient Goals Review   Personal Goals Review -- Heart Failure;Hypertension;Lipids;Stress;Weight Management/Obesity Heart Failure;Hypertension;Lipids;Stress;Weight Management/Obesity Heart Failure;Hypertension;Lipids;Stress;Weight Management/Obesity    Review -- Karen Cameron is doing well with exercise at cardiac rehab. VSS. Karen Cameron has begun to increase her treadmill met levels. Karen Cameron is doing well with exercise at cardiac rehab. VSS. Karen Cameron has increased her met levels over the last 30 days Karen Cameron is doing well with exercise at cardiac rehab. VSS. Karen Cameron has continued to increase her met levels over the last 30 days  Expected Outcomes -- Karen Cameron will continue to particpate in cardiac  rehab for exercise nutriton and lifestyle modifications. Karen Cameron will continue to particpate in cardiac rehab for exercise nutriton and lifestyle modifications. Karen Cameron will continue to particpate in cardiac rehab for exercise nutriton and lifestyle modifications.       Exercise Goals and Review:  Exercise Goals     Row Name 09/18/23 1107             Exercise Goals   Increase Physical Activity Yes       Intervention Develop an individualized exercise prescription for aerobic and resistive training based on initial evaluation findings, risk stratification, comorbidities and participant's personal goals.;Provide advice, education, support and counseling about physical activity/exercise needs.       Expected Outcomes Short Term: Attend rehab on a regular basis to increase amount of physical activity.;Long Term: Add in home exercise to make exercise part of routine and to increase amount of physical activity.;Long Term: Exercising regularly at least 3-5 days a week.       Increase Strength and Stamina Yes       Intervention Provide advice, education, support and counseling about physical activity/exercise needs.;Develop an individualized exercise prescription for aerobic and resistive training based on initial evaluation findings, risk stratification, comorbidities and participant's personal goals.       Expected Outcomes Short Term: Increase workloads from initial exercise prescription for resistance, speed, and METs.;Long Term: Improve cardiorespiratory fitness, muscular endurance and strength as measured by increased METs and functional capacity ( );Short Term: Perform resistance training exercises routinely during rehab and add in resistance training at home       Able to understand and use rate of perceived exertion (RPE) scale Yes       Intervention Provide education and explanation on how to use RPE scale       Expected Outcomes Short Term: Able to use RPE daily in rehab to express  subjective intensity level;Long Term:  Able to use RPE to guide intensity level when exercising independently       Able to understand and use Dyspnea scale Yes       Intervention Provide education and explanation on how to use Dyspnea scale       Expected Outcomes Long Term: Able to use Dyspnea scale to guide intensity level when exercising independently;Short Term: Able to use Dyspnea scale daily in rehab to express subjective sense of shortness of breath during exertion       Knowledge and understanding of Target Heart Rate Range (THRR) Yes       Intervention Provide education and explanation of THRR including how the numbers were predicted and where they are located for reference       Expected Outcomes Short Term: Able to state/look up THRR;Long Term: Able to use THRR to govern intensity when exercising independently;Short Term: Able to use daily as guideline for intensity in rehab       Understanding of Exercise Prescription Yes       Intervention Provide education, explanation, and written materials on patient's individual exercise prescription       Expected Outcomes Short Term: Able to explain program exercise prescription;Long Term: Able to explain home exercise prescription to exercise independently          Exercise Goals Re-Evaluation:  Exercise Goals Re-Evaluation     Row Name 09/24/23 1421 10/24/23 1500 11/28/23 1718 12/12/23 1500       Exercise Goal Re-Evaluation   Exercise Goals Review Increase Physical Activity;Understanding  of Exercise Prescription;Increase Strength and Stamina;Knowledge and understanding of Target Heart Rate Range (THRR);Able to understand and use rate of perceived exertion (RPE) scale Increase Physical Activity;Understanding of Exercise Prescription;Increase Strength and Stamina;Knowledge and understanding of Target Heart Rate Range (THRR);Able to understand and use rate of perceived exertion (RPE) scale Increase Physical Activity;Understanding of Exercise  Prescription;Increase Strength and Stamina;Knowledge and understanding of Target Heart Rate Range (THRR);Able to understand and use rate of perceived exertion (RPE) scale Increase Physical Activity;Understanding of Exercise Prescription;Increase Strength and Stamina;Knowledge and understanding of Target Heart Rate Range (THRR);Able to understand and use rate of perceived exertion (RPE) scale    Comments Pt's first day in the CRP2 program. Pt understands the exercise Rx, RPE scale and THRR. Reviewed METs and goals today. Pt voices progress on her goals of incresed stamina, climbing stairs with less/no SOB, and increased energy. Peak METs to date are 3.3. Reviewed METs and goals today. Pt contines to voice progress on her goals of incresed stamina, climbing stairs with less/no SOB, and increased energy. Peak METs to date are 4.7. Pt aslo is doing yoga again, which was a goal. Pt graduated from the CRP2 program today. Pt contines to voice progress on her goals of incresed stamina, climbing stairs with less/no SOB, and increased energy. Peak METs to date remain at 4.7. Pt continues doing yoga and plans to resume Tai Chi. Pt will continue her exercise by walking and she also plans to utilize the employee gym at [facility], the [facility], and the Senior center.  Pt will use treadmill and bike and exercise 3-5x/week.    Expected Outcomes Will continue to monitor patient and progress exercise workloads as tolerated. Will continue to monitor patient and progress exercise workloads as tolerated. Will continue to monitor patient and progress exercise workloads as tolerated. Pt will continue to exercise on her own and at the employee gym, home, [employer] nad senior center 3-5x/week.       Nutrition & Weight - Outcomes:  Pre Biometrics - 09/18/23 0757       Pre Biometrics   Waist Circumference 30 inches    Hip Circumference 35 inches    Waist to Hip Ratio 0.86 %    Triceps Skinfold 9 mm    % Body Fat 28.1 %    Grip Strength 20 kg     Flexibility 16.25 in    Single Leg Stand 30 seconds          Post Biometrics - 12/07/23 1315        Post  Biometrics   Height 5' 3 (1.6 m)    Weight 59 kg    Waist Circumference 31 inches    Hip Circumference 34 inches    Waist to Hip Ratio 0.91 %    BMI (Calculated) 23.05    Triceps Skinfold 10 mm    % Body Fat 29 %    Grip Strength 22 kg    Flexibility 16.75 in    Single Leg Stand 30 seconds          Nutrition:   Nutrition Discharge:   Education Questionnaire Score:  Knowledge Questionnaire Score - 12/10/23 1631       Knowledge Questionnaire Score   Post Score 23/24         Pt graduates from  Intensive/Traditional cardiac rehab program today with completion of 68 exercise and education sessions. Pt maintained good attendance and progressed nicely during their participation in rehab as evidenced by increased MET level.   Medication  list reconciled. Repeat  PHQ score-0.  Pt has made significant lifestyle changes and should be commended for their success.  Karen Cameron achieved their goals during cardiac rehab.   Pt plans to continue exercise at the [facility], the [employer], and at her workplace gym 3-5x/week.  Goals reviewed with patient; copy given to patient.

## 2023-12-12 ENCOUNTER — Encounter (HOSPITAL_COMMUNITY)
Admission: RE | Admit: 2023-12-12 | Discharge: 2023-12-12 | Disposition: A | Discharge status: Home / Self Care | Source: Ambulatory Visit | Attending: Cardiology | Admitting: Cardiology

## 2023-12-12 DIAGNOSIS — I2102 ST elevation (STEMI) myocardial infarction involving left anterior descending coronary artery: Secondary | ICD-10-CM | POA: Diagnosis not present

## 2023-12-12 DIAGNOSIS — Z955 Presence of coronary angioplasty implant and graft: Secondary | ICD-10-CM

## 2023-12-17 ENCOUNTER — Ambulatory Visit (HOSPITAL_BASED_OUTPATIENT_CLINIC_OR_DEPARTMENT_OTHER): Attending: Internal Medicine

## 2023-12-18 ENCOUNTER — Other Ambulatory Visit (HOSPITAL_COMMUNITY): Payer: Self-pay

## 2023-12-18 ENCOUNTER — Encounter (HOSPITAL_COMMUNITY): Payer: Self-pay | Admitting: Cardiology

## 2023-12-18 ENCOUNTER — Ambulatory Visit (HOSPITAL_COMMUNITY): Admission: RE | Admit: 2023-12-18 | Discharge: 2023-12-18 | Attending: Family Medicine | Admitting: Family Medicine

## 2023-12-18 ENCOUNTER — Inpatient Hospital Stay (HOSPITAL_COMMUNITY): Admission: RE | Admit: 2023-12-18 | Discharge: 2023-12-18 | Attending: Cardiology | Admitting: Cardiology

## 2023-12-18 ENCOUNTER — Other Ambulatory Visit: Payer: Self-pay

## 2023-12-18 VITALS — BP 110/66 | HR 82 | Ht 63.0 in | Wt 131.0 lb

## 2023-12-18 DIAGNOSIS — E785 Hyperlipidemia, unspecified: Secondary | ICD-10-CM | POA: Insufficient documentation

## 2023-12-18 DIAGNOSIS — Z7902 Long term (current) use of antithrombotics/antiplatelets: Secondary | ICD-10-CM | POA: Insufficient documentation

## 2023-12-18 DIAGNOSIS — Z79899 Other long term (current) drug therapy: Secondary | ICD-10-CM | POA: Insufficient documentation

## 2023-12-18 DIAGNOSIS — I110 Hypertensive heart disease with heart failure: Secondary | ICD-10-CM

## 2023-12-18 DIAGNOSIS — I371 Nonrheumatic pulmonary valve insufficiency: Secondary | ICD-10-CM | POA: Insufficient documentation

## 2023-12-18 DIAGNOSIS — Z7984 Long term (current) use of oral hypoglycemic drugs: Secondary | ICD-10-CM | POA: Insufficient documentation

## 2023-12-18 DIAGNOSIS — I5022 Chronic systolic (congestive) heart failure: Secondary | ICD-10-CM | POA: Insufficient documentation

## 2023-12-18 DIAGNOSIS — Z95810 Presence of automatic (implantable) cardiac defibrillator: Secondary | ICD-10-CM | POA: Diagnosis not present

## 2023-12-18 DIAGNOSIS — I2510 Atherosclerotic heart disease of native coronary artery without angina pectoris: Secondary | ICD-10-CM

## 2023-12-18 DIAGNOSIS — Z7982 Long term (current) use of aspirin: Secondary | ICD-10-CM | POA: Diagnosis not present

## 2023-12-18 DIAGNOSIS — I081 Rheumatic disorders of both mitral and tricuspid valves: Secondary | ICD-10-CM | POA: Diagnosis not present

## 2023-12-18 DIAGNOSIS — I252 Old myocardial infarction: Secondary | ICD-10-CM | POA: Diagnosis not present

## 2023-12-18 LAB — ECHOCARDIOGRAM COMPLETE
AR max vel: 2.02 cm2
AV Area VTI: 1.97 cm2
AV Area mean vel: 2.09 cm2
AV Mean grad: 2 mmHg
AV Peak grad: 3.9 mmHg
Ao pk vel: 0.98 m/s
Area-P 1/2: 6.27 cm2
Calc EF: 25.7 %
S' Lateral: 4.5 cm
Single Plane A2C EF: 24.2 %
Single Plane A4C EF: 27 %

## 2023-12-18 MED ORDER — METOPROLOL SUCCINATE ER 50 MG PO TB24
50.0000 mg | ORAL_TABLET | Freq: Every day | ORAL | 3 refills | Status: AC
  Filled 2023-12-18 (×2): qty 90, 90d supply, fill #0
  Filled 2024-01-08 – 2024-01-17 (×2): qty 90, 90d supply, fill #1

## 2023-12-18 NOTE — Progress Notes (Signed)
° °  ADVANCED HEART FAILURE FOLLOW UP CLINIC NOTE  Referring Physician: Georgina Speaks, FNP  Primary Care: Georgina Speaks, FNP Primary Cardiologist:  HPI: Karen Cameron is a 58 y.o. female who presents for follow up of chronic systolic heart failure.      Admitted 8/25 with STEMI. EKG showed anterolateral STEMI with active chest discomfort. Taken urgently to cath lab where cath showed acutely occluded LAD with significant thrombotic lesion. Patient underwent thrombectomy, and PTCA of LAD. Dr Zenaida completed RHC which showed low filling pressures and low CI, IABP placed. Required DBA gtt. Echo showed EF 25-30%, normal RV.  Drips and IABP eventually weaned and GDMT titrated. LifeVest placed at discharge due to runs of VT. She was discharged home, weight 130 lbs.      SUBJECTIVE:  PMH, current medications, allergies, social history, and family history reviewed in epic.  PHYSICAL EXAM: There were no vitals filed for this visit. GENERAL: Well nourished and in no apparent distress at rest.  PULM:  Normal work of breathing, clear to auscultation bilaterally. Respirations are unlabored.  CARDIAC:  JVP: ***         Normal rate with regular rhythm. No murmurs, rubs or gallops.  *** edema. Warm and well perfused extremities. ABDOMEN: Soft, non-tender, non-distended. NEUROLOGIC: Patient is oriented x3 with no focal or lateralizing neurologic deficits.    DATA REVIEW  ECG: ***    ECHO: ***   CATH: ***   Heart failure review: - Classification: {HFCLASS:30917} - Etiology: {Cardiomyopathy:30918} - NYHA Class:  - Volume status: {volumechf:30919} - ACEi/ARB/ARNI: {HF:30752} - Aldosterone antagonist: {HF:30752} - Beta-blocker: {HF:30752} - Digoxin : {HF:30752} - Hydralazine /Nitrates: {HF:30752} - SGLT2i: {HF:30752} - GLP-1: {GLP:30906} - Advanced therapies: {Advancedtherapies:30916} - ICD: {ICD:30901}  ASSESSMENT & PLAN:  ***  Follow up in ***  Morene Zenaida, MD Advanced  Heart Failure Mechanical Circulatory Support 12/18/2023

## 2023-12-18 NOTE — Patient Instructions (Addendum)
 CHANGE Toprol  XL to 50 mg daily.  You have been referred to Electrophysiologist. They will call you to arrange your appointment.  Your physician recommends that you schedule a follow-up appointment in: 4 months (April 2026) ** PLEASE CALL THE OFFICE TO ARRANGE YOUR FOLLOW UP APPOINTMENT.**  If you have any questions or concerns before your next appointment please send us  a message through [place] or call our office at [phone].    TO LEAVE A MESSAGE FOR THE NURSE SELECT OPTION 2, PLEASE LEAVE A MESSAGE INCLUDING: YOUR NAME DATE OF BIRTH CALL BACK NUMBER REASON FOR CALL**this is important as we prioritize the call backs  YOU WILL RECEIVE A CALL BACK THE SAME DAY AS LONG AS YOU CALL BEFORE 4:00 PM  At the [facility], you and your health needs are our priority. As part of our continuing mission to provide you with exceptional heart care, we have created designated Provider Care Teams. These Care Teams include your primary Cardiologist (physician) and Advanced Practice Providers (APPs- Physician Assistants and Nurse Practitioners) who all work together to provide you with the care you need, when you need it.   You may see any of the following providers on your designated Care Team at your next follow up: Dr Toribio Fuel Dr Ezra Shuck Dr. Morene Brownie Greig Mosses, NP Caffie Shed, [place] [facility] [place], [place] Beckey Coe, NP Jordan Lee, NP Ellouise Class, NP Tinnie Redman, PharmD Jaun Bash, PharmD   Please be sure to bring in all your medications bottles to every appointment.    Thank you for choosing [facility]

## 2024-01-08 ENCOUNTER — Other Ambulatory Visit (HOSPITAL_COMMUNITY): Payer: Self-pay

## 2024-01-08 ENCOUNTER — Other Ambulatory Visit: Payer: Self-pay

## 2024-01-17 ENCOUNTER — Other Ambulatory Visit: Payer: Self-pay

## 2024-01-18 ENCOUNTER — Ambulatory Visit: Admitting: Student in an Organized Health Care Education/Training Program

## 2024-01-21 ENCOUNTER — Other Ambulatory Visit (HOSPITAL_COMMUNITY): Payer: Self-pay

## 2024-02-01 ENCOUNTER — Encounter: Payer: Self-pay | Admitting: Student in an Organized Health Care Education/Training Program

## 2024-02-01 ENCOUNTER — Telehealth: Payer: Self-pay

## 2024-02-01 ENCOUNTER — Ambulatory Visit
Attending: Student in an Organized Health Care Education/Training Program | Admitting: Student in an Organized Health Care Education/Training Program

## 2024-02-01 VITALS — BP 108/64 | HR 66 | Ht 62.0 in | Wt 131.6 lb

## 2024-02-01 DIAGNOSIS — E7849 Other hyperlipidemia: Secondary | ICD-10-CM

## 2024-02-01 DIAGNOSIS — I2510 Atherosclerotic heart disease of native coronary artery without angina pectoris: Secondary | ICD-10-CM | POA: Diagnosis not present

## 2024-02-01 DIAGNOSIS — I10 Essential (primary) hypertension: Secondary | ICD-10-CM | POA: Diagnosis not present

## 2024-02-01 DIAGNOSIS — Z01812 Encounter for preprocedural laboratory examination: Secondary | ICD-10-CM | POA: Diagnosis not present

## 2024-02-01 DIAGNOSIS — I2109 ST elevation (STEMI) myocardial infarction involving other coronary artery of anterior wall: Secondary | ICD-10-CM

## 2024-02-01 DIAGNOSIS — I429 Cardiomyopathy, unspecified: Secondary | ICD-10-CM

## 2024-02-01 DIAGNOSIS — I5022 Chronic systolic (congestive) heart failure: Secondary | ICD-10-CM | POA: Diagnosis not present

## 2024-02-01 LAB — BASIC METABOLIC PANEL WITH GFR
BUN/Creatinine Ratio: 10 (ref 9–23)
BUN: 13 mg/dL (ref 6–24)
CO2: 22 mmol/L (ref 20–29)
Calcium: 9.8 mg/dL (ref 8.7–10.2)
Chloride: 104 mmol/L (ref 96–106)
Creatinine, Ser: 1.3 mg/dL — ABNORMAL HIGH (ref 0.57–1.00)
Glucose: 76 mg/dL (ref 70–99)
Potassium: 4.9 mmol/L (ref 3.5–5.2)
Sodium: 140 mmol/L (ref 134–144)
eGFR: 48 mL/min/{1.73_m2} — ABNORMAL LOW

## 2024-02-01 LAB — CBC
Hematocrit: 47.5 % — ABNORMAL HIGH (ref 34.0–46.6)
Hemoglobin: 15.2 g/dL (ref 11.1–15.9)
MCH: 27.9 pg (ref 26.6–33.0)
MCHC: 32 g/dL (ref 31.5–35.7)
MCV: 87 fL (ref 79–97)
Platelets: 326 10*3/uL (ref 150–450)
RBC: 5.44 x10E6/uL — ABNORMAL HIGH (ref 3.77–5.28)
RDW: 15.1 % (ref 11.7–15.4)
WBC: 5.4 10*3/uL (ref 3.4–10.8)

## 2024-02-01 NOTE — Telephone Encounter (Signed)
-----   Message from Nurse Aldona SAUNDERS, RN sent at 02/01/2024  2:38 PM EST ----- Regarding: 2/3 ICD [facility] Important: list procedure date as first item in subject line, followed by procedure type (e.g., 09/14/23 PPM implant)  Precert:  MD: [place] Type of implant: ICD Device manufacturer: [employer] Diagnosis: Cardiomyopathy CPT code: ICD implant - 66750 C-code(s), including quantity (if indicated):  Procedure scheduled (date/time): 02/03 930am  Procedure:  Scrub given? Yes  Medication instructions: See Instruction letter - pt educated Message sent to CVRR? No Added to calendar? Yes Orders entered? Yes Letter complete? Yes Scheduled with cath lab? Yes Labs ordered (CBC, BMET, PT/INR if on warfarin)? Yes Dye allergy ? No Pre-meds ordered and instructions given? N/A Letter method: given to pt Special instructions: none H&P: 01/30  Follow-up:  Cassie/Angel, please schedule Routine.  Covering RN:  Please send this message to [employer], EP scheduler, EP Scheduling pool, and EP [employer].

## 2024-02-01 NOTE — Progress Notes (Unsigned)
" °  Cardiology Office Note   Date:  02/01/2024  ID:  Karen Cameron, Karen Cameron September 18, 1965, MRN [id number] PCP: Georgina Speaks, FNP  [facility] Providers Cardiologist:  None { Click to update primary MD,subspecialty MD or APP then REFRESH:1}    History of Present Illness Karen Cameron is a 59 y.o. female ***  ROS: ***  Studies Reviewed      *** Risk Assessment/Calculations {Does this patient have ATRIAL FIBRILLATION?:[phone]}         Physical Exam VS:  BP 108/64   Pulse 66   Ht 5' 2 (1.575 m)   Wt 131 lb 9.6 oz (59.7 kg)   LMP  (LMP Unknown)   SpO2 99%   BMI 24.07 kg/m        Wt Readings from Last 3 Encounters:  02/01/24 131 lb 9.6 oz (59.7 kg)  12/18/23 131 lb (59.4 kg)  12/07/23 130 lb 1.1 oz (59 kg)    GEN: Well nourished, well developed in no acute distress NECK: No JVD; No carotid bruits CARDIAC: ***RRR, no murmurs, rubs, gallops RESPIRATORY:  Clear to auscultation without rales, wheezing or rhonchi  ABDOMEN: Soft, non-tender, non-distended EXTREMITIES:  No edema; No deformity   ASSESSMENT AND PLAN ***    {Are you ordering a CV Procedure (e.g. stress test, cath, DCCV, TEE, etc)?   Press F2        :[id number]}  Dispo: ***  Signed, Donnice DELENA Primus, MD  "

## 2024-02-01 NOTE — Patient Instructions (Signed)
 Medication Instructions:  Your physician recommends that you continue on your current medications as directed. Please refer to the Current Medication list given to you today.   *If you need a refill on your cardiac medications before your next appointment, please call your pharmacy*  Lab Work: CBC and BMET today If you have labs (blood work) drawn today and your tests are completely normal, you will receive your results only by: MyChart Message (if you have MyChart) OR A paper copy in the mail If you have any lab test that is abnormal or we need to change your treatment, we will call you to review the results.  Testing/Procedures: Your physician has recommended that you have a defibrillator inserted. An implantable cardioverter defibrillator (ICD) is a small device that is placed in your chest or, in rare cases, your abdomen. This device uses electrical pulses or shocks to help control life-threatening, irregular heartbeats that could lead the heart to suddenly stop beating (sudden cardiac arrest). Leads are attached to the ICD that goes into your heart. This is done in the hospital and usually requires an overnight stay. Please see the instruction sheet given to you today for more information.   Follow-Up: At [facility], you and your health needs are our priority.  As part of our continuing mission to provide you with exceptional heart care, our providers are all part of one team.  This team includes your primary Cardiologist (physician) and Advanced Practice Providers or APPs (Physician Assistants and Nurse Practitioners) who all work together to provide you with the care you need, when you need it.  Your next appointment:   To be scheduled

## 2024-02-05 ENCOUNTER — Encounter (HOSPITAL_COMMUNITY): Payer: Self-pay | Admitting: Student in an Organized Health Care Education/Training Program

## 2024-02-05 ENCOUNTER — Ambulatory Visit (HOSPITAL_COMMUNITY)
Admission: RE | Admit: 2024-02-05 | Discharge: 2024-02-05 | Disposition: A | Attending: Student in an Organized Health Care Education/Training Program | Admitting: Student in an Organized Health Care Education/Training Program

## 2024-02-05 ENCOUNTER — Encounter (HOSPITAL_COMMUNITY)
Admission: RE | Disposition: A | Payer: Self-pay | Source: Home / Self Care | Attending: Student in an Organized Health Care Education/Training Program

## 2024-02-05 ENCOUNTER — Other Ambulatory Visit (HOSPITAL_COMMUNITY): Payer: Self-pay

## 2024-02-05 ENCOUNTER — Other Ambulatory Visit: Payer: Self-pay

## 2024-02-05 ENCOUNTER — Ambulatory Visit (HOSPITAL_COMMUNITY)

## 2024-02-05 DIAGNOSIS — I5022 Chronic systolic (congestive) heart failure: Secondary | ICD-10-CM | POA: Insufficient documentation

## 2024-02-05 DIAGNOSIS — Z7902 Long term (current) use of antithrombotics/antiplatelets: Secondary | ICD-10-CM | POA: Insufficient documentation

## 2024-02-05 DIAGNOSIS — Z79899 Other long term (current) drug therapy: Secondary | ICD-10-CM | POA: Insufficient documentation

## 2024-02-05 DIAGNOSIS — I2510 Atherosclerotic heart disease of native coronary artery without angina pectoris: Secondary | ICD-10-CM | POA: Insufficient documentation

## 2024-02-05 DIAGNOSIS — I429 Cardiomyopathy, unspecified: Secondary | ICD-10-CM

## 2024-02-05 DIAGNOSIS — I255 Ischemic cardiomyopathy: Secondary | ICD-10-CM | POA: Insufficient documentation

## 2024-02-05 DIAGNOSIS — Z7982 Long term (current) use of aspirin: Secondary | ICD-10-CM | POA: Insufficient documentation

## 2024-02-05 DIAGNOSIS — I252 Old myocardial infarction: Secondary | ICD-10-CM | POA: Insufficient documentation

## 2024-02-05 MED ORDER — LIDOCAINE HCL (PF) 1 % IJ SOLN
INTRAMUSCULAR | Status: DC | PRN
  Administered 2024-02-05: 30 mL

## 2024-02-05 MED ORDER — SODIUM CHLORIDE 0.9 % IV SOLN: INTRAVENOUS | Status: DC

## 2024-02-05 MED ORDER — FENTANYL CITRATE (PF) 100 MCG/2ML IJ SOLN
INTRAMUSCULAR | Status: AC
  Filled 2024-02-05: qty 2

## 2024-02-05 MED ORDER — CEFAZOLIN SODIUM-DEXTROSE 2-4 GM/100ML-% IV SOLN: 2.0000 g | INTRAVENOUS | Status: AC

## 2024-02-05 MED ORDER — SODIUM CHLORIDE 0.9 % IV SOLN: 80.0000 mg | INTRAVENOUS | Status: AC

## 2024-02-05 MED ORDER — OXYCODONE HCL 5 MG PO TABS
5.0000 mg | ORAL_TABLET | ORAL | 0 refills | Status: AC | PRN
  Filled 2024-02-05: qty 12, 2d supply, fill #0

## 2024-02-05 MED ORDER — FENTANYL CITRATE (PF) 100 MCG/2ML IJ SOLN
INTRAMUSCULAR | Status: DC | PRN
  Administered 2024-02-05 (×2): 25 ug via INTRAVENOUS

## 2024-02-05 MED ORDER — MIDAZOLAM HCL 5 MG/5ML IJ SOLN
INTRAMUSCULAR | Status: DC | PRN
  Administered 2024-02-05 (×2): .5 mg via INTRAVENOUS

## 2024-02-05 MED ORDER — MIDAZOLAM HCL 2 MG/2ML IJ SOLN
INTRAMUSCULAR | Status: AC
  Filled 2024-02-05: qty 2

## 2024-02-05 MED ORDER — ONDANSETRON HCL 4 MG/2ML IJ SOLN
INTRAMUSCULAR | Status: AC
  Filled 2024-02-05: qty 2

## 2024-02-05 MED ORDER — POVIDONE-IODINE 10 % EX SWAB
2.0000 | Freq: Once | CUTANEOUS | Status: AC
  Administered 2024-02-05: 2 via TOPICAL

## 2024-02-05 MED ORDER — LIDOCAINE HCL (PF) 1 % IJ SOLN
INTRAMUSCULAR | Status: AC
  Filled 2024-02-05: qty 60

## 2024-02-05 MED ORDER — SODIUM CHLORIDE 0.9 % IV SOLN
INTRAVENOUS | Status: AC
  Administered 2024-02-05: 80 mg
  Filled 2024-02-05: qty 2

## 2024-02-05 MED ORDER — CEFAZOLIN SODIUM-DEXTROSE 2-4 GM/100ML-% IV SOLN
INTRAVENOUS | Status: AC
  Administered 2024-02-05: 2 g via INTRAVENOUS
  Filled 2024-02-05: qty 100

## 2024-02-05 MED ORDER — CHLORHEXIDINE GLUCONATE 4 % EX SOLN: 4.0000 | Freq: Once | CUTANEOUS | Status: DC

## 2024-02-05 MED ORDER — ONDANSETRON HCL 4 MG/2ML IJ SOLN
4.0000 mg | Freq: Once | INTRAMUSCULAR | Status: AC
  Administered 2024-02-05: 4 mg via INTRAVENOUS

## 2024-02-05 NOTE — Op Note (Signed)
" ° °  Procedure:  LEFT sided BSCI Dual Chamber ICD Implant  Pre-op Diagnosis: HFrEF, ICM, LVEF < 35%, NYHA class II, 1' prevention ICD    Post-op Diagnosis: same  Procedure Date: 02/05/24  Attending: Adina Primus, MD  Anesthesia: monitored anesthesia care   Procedure Report:  The LEFT shoulder was prepped with chlorhexidine  scrub. 60 cc lidocaine  was injected at the planned incision site. A pocket was created with electrocautery and blunt dissection.    No venogram was performed. The LEFT axillary vein was accessed using a standard access needle under ultrasound guidance. A wire was passed into the IVC.   An 8 Fr sheath was advanced and the dilator removed. The RV ICD lead was inserted into the sheath and the lead was then advanced into the RVOT. After exchange of pigtail shaped stylet for a curved stylet, the lead was retracted into the RV cavity and advanced to the RV apical septum. Two view flouroscopy then confirmed RV apical, septal position. The helix was extended and the lead fixation was tested with lead tension. Interrogation with R waves 5-6 mV and minimal injury. The RV apical septum was mapped and the entire septum had low R waves and minimal passive injury. The RV lead was advanced back to the RVOT and the basal septum was mapped with R waves 10-15 mV however for a more mid septal position passive injury was again low. The lead was deployed however active injury still was minimal. The R waves dropped from 11 mV to 7 mV. The lead was again repositioned midway between the apex and base. Here the R waves were 10-11 mV and passive injury was much better than prior. Interrogation revealed a current of injury with acceptable lead parameters (see below). The sheath was split and removed and the RV ICD lead was firmly attached to the pre-pectoralis fascia with two 1-silk sutures.   The device was then connected to the lead. The pocket was irrigated with gentamicin  solution. The pocket was  closed with continuous 2-0 V-Loc and 3-0 StrataFix suture. Dermabond was applied over the incision.   The device was evaluated by the representative of the cardiac device manufacturer with implant parameters listed below.   Complications: none Estimated blood loss: less than 50 mL  Implanted Hardware: Implant Name Type Inv. Item Serial No. Manufacturer Lot No. LRB No. Used Action  LEAD RELIANCE 0672 - [id number]  LEAD RELIANCE 0672 [id number] [employer]  N/A 1 Implanted  ICD VIGILANT VR D232 - [id number] Pacemaker ICD VIGILANT VR D232 [id number] [employer]  N/A 1 Implanted   Device Information: ICD Generator: BSCI Resonate EL ICD D433, SN: [id number], DOI 02/05/24 RV: BSCI Reliance 4-Front SGL Active, SN: [id number], DOI 02/05/24  Lead Interrogation Data: RV: 10.1 mV / 427 ohms / 0.7 V @ 0.5 ms Shock Impedance: 46 ohms  Summary: 1. Successful implant of a LEFT sided BSCI VVI ICD   Recommendations: Routine post-procedure care with bedrest for 3 hours No heparin  (IV or subcutaneous) for 48 hours. No enoxaparin  (IV or subcutaneous) for 7 days.  Resume DAPT (ASA+Brilinta ) on 02/05 AM PA/lateral CXR today Wound check today Device interrogation today Discharge with sling and pressure dressing, she will remove 02/04 AM  Plan for same day discharge   Karen DELENA Primus, MD [facility]  Cardiac Electrophysiology       "

## 2024-02-07 ENCOUNTER — Encounter (HOSPITAL_COMMUNITY): Payer: Self-pay | Admitting: Student in an Organized Health Care Education/Training Program

## 2024-02-19 ENCOUNTER — Ambulatory Visit

## 2024-02-21 ENCOUNTER — Ambulatory Visit: Admitting: Nurse Practitioner

## 2024-03-18 ENCOUNTER — Ambulatory Visit

## 2024-05-06 ENCOUNTER — Ambulatory Visit: Admitting: Physician Assistant

## 2024-05-22 ENCOUNTER — Encounter: Payer: Self-pay | Admitting: Nurse Practitioner

## 2024-06-17 ENCOUNTER — Ambulatory Visit

## 2024-09-16 ENCOUNTER — Ambulatory Visit

## 2024-12-16 ENCOUNTER — Ambulatory Visit

## 2025-03-17 ENCOUNTER — Ambulatory Visit

## 2025-06-16 ENCOUNTER — Ambulatory Visit
